# Patient Record
Sex: Female | Born: 1950
Health system: Southern US, Community
[De-identification: ages and names within clinical notes are randomized; demographics above are authoritative.]

## PROBLEM LIST (undated history)

## (undated) DIAGNOSIS — B1920 Unspecified viral hepatitis C without hepatic coma: Secondary | ICD-10-CM

## (undated) DIAGNOSIS — D72819 Decreased white blood cell count, unspecified: Secondary | ICD-10-CM

## (undated) DIAGNOSIS — E7800 Pure hypercholesterolemia, unspecified: Secondary | ICD-10-CM

## (undated) DIAGNOSIS — C50919 Malignant neoplasm of unspecified site of unspecified female breast: Secondary | ICD-10-CM

## (undated) DIAGNOSIS — I10 Essential (primary) hypertension: Secondary | ICD-10-CM

## (undated) HISTORY — PX: ABDOMINAL HYSTERECTOMY: SHX81

## (undated) HISTORY — DX: Decreased white blood cell count, unspecified: D72.819

## (undated) HISTORY — DX: Unspecified viral hepatitis C without hepatic coma: B19.20

## (undated) HISTORY — DX: Pure hypercholesterolemia, unspecified: E78.00

## (undated) HISTORY — PX: BREAST SURGERY: SHX581

## (undated) HISTORY — DX: Malignant neoplasm of unspecified site of unspecified female breast: C50.919

---

## 2001-04-29 ENCOUNTER — Encounter: Payer: Self-pay | Admitting: Emergency Medicine

## 2001-04-29 ENCOUNTER — Emergency Department (HOSPITAL_COMMUNITY): Admission: EM | Admit: 2001-04-29 | Discharge: 2001-04-29 | Payer: Self-pay | Admitting: Emergency Medicine

## 2002-03-02 DIAGNOSIS — C50919 Malignant neoplasm of unspecified site of unspecified female breast: Secondary | ICD-10-CM

## 2002-03-02 HISTORY — DX: Malignant neoplasm of unspecified site of unspecified female breast: C50.919

## 2003-03-03 HISTORY — PX: MASTECTOMY: SHX3

## 2003-04-20 ENCOUNTER — Ambulatory Visit (HOSPITAL_COMMUNITY): Admission: RE | Admit: 2003-04-20 | Discharge: 2003-04-20 | Payer: Self-pay | Admitting: General Surgery

## 2003-04-30 ENCOUNTER — Inpatient Hospital Stay (HOSPITAL_COMMUNITY): Admission: RE | Admit: 2003-04-30 | Discharge: 2003-05-02 | Payer: Self-pay | Admitting: General Surgery

## 2003-05-09 ENCOUNTER — Encounter (HOSPITAL_COMMUNITY): Admission: RE | Admit: 2003-05-09 | Discharge: 2003-06-08 | Payer: Self-pay | Admitting: Oncology

## 2003-05-09 ENCOUNTER — Encounter: Admission: RE | Admit: 2003-05-09 | Discharge: 2003-05-09 | Payer: Self-pay | Admitting: Oncology

## 2004-10-28 ENCOUNTER — Encounter: Payer: Self-pay | Admitting: Emergency Medicine

## 2004-10-29 ENCOUNTER — Inpatient Hospital Stay (HOSPITAL_COMMUNITY): Admission: AD | Admit: 2004-10-29 | Discharge: 2004-10-31 | Payer: Self-pay | Admitting: Neurosurgery

## 2006-07-05 IMAGING — CT CT HEAD W/O CM
1 of 2 series · 13 of 30 positions shown, 17 images · non-contrast
Comparison: 10/28/2004

CLINICAL DATA: Subdural hematoma

HEAD CT WITHOUT CONTRAST
TECHNIQUE: 5mm collimated images were obtained from the base of the skull
through the vertex according to standard protocol without contrast.

[Series 2: brain · axial · 0.49mm/px · z∈[+152,+279]mm · 13 of 32 slices shown, 17 images]
[im 3/32  brain]
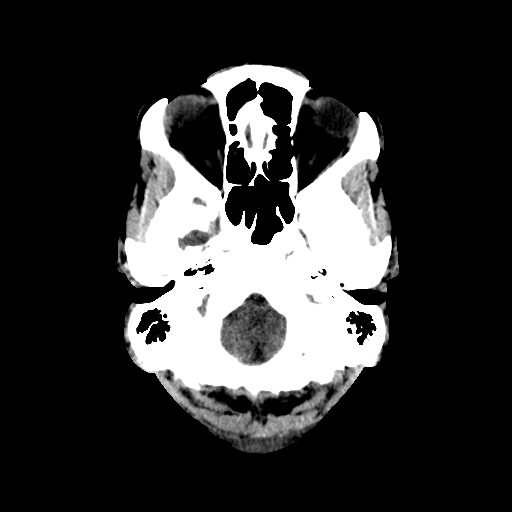
[im 3/32  bone]
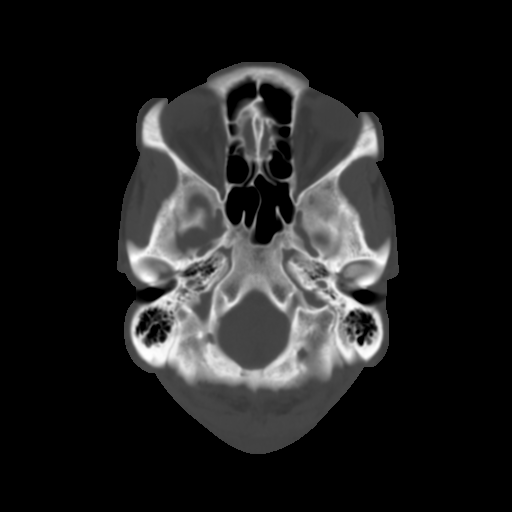
[im 5/32  brain]
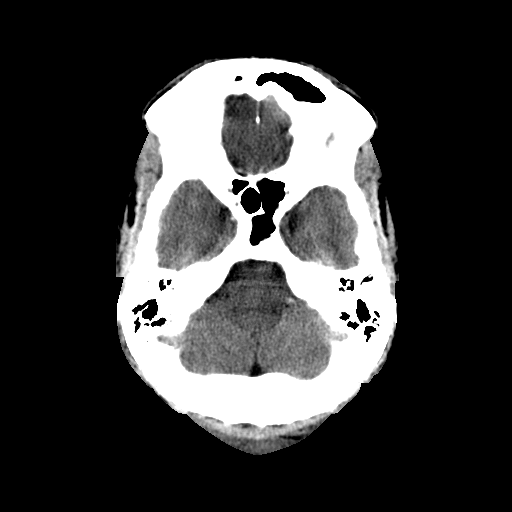
[im 7/32  brain]
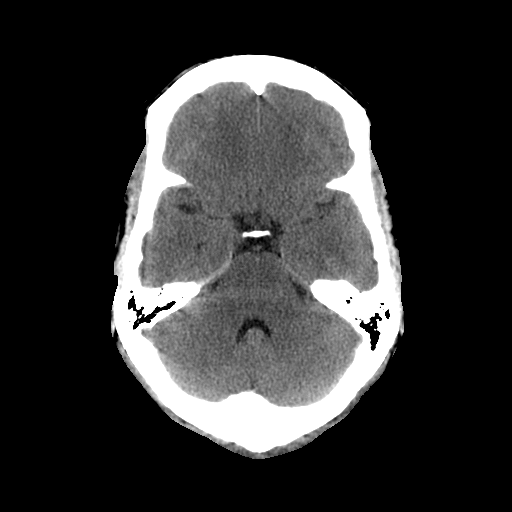
[im 9/32  brain]
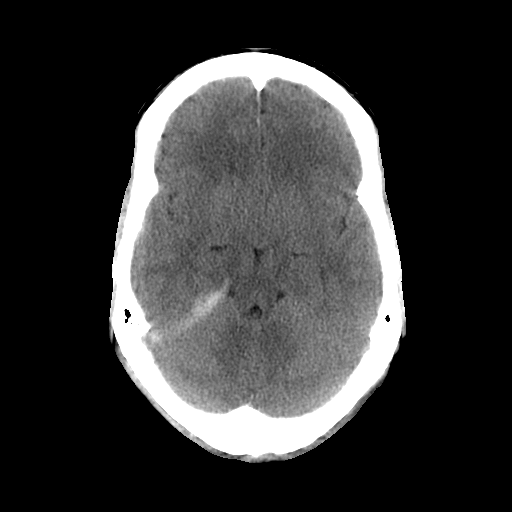
[im 12/32  brain]
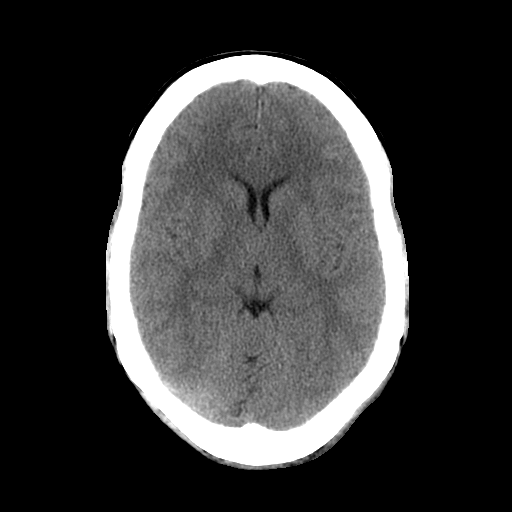
[im 12/32  bone]
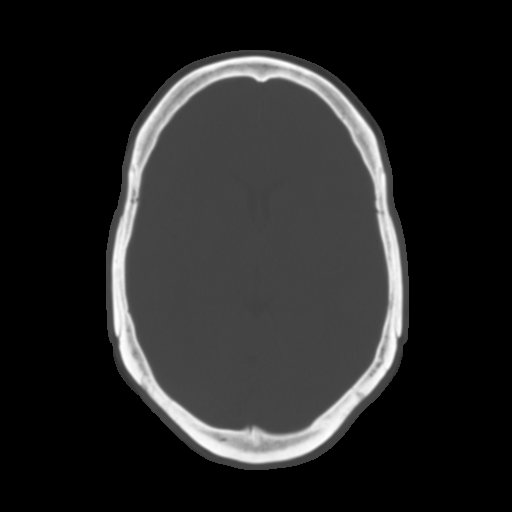
[im 14/32  brain]
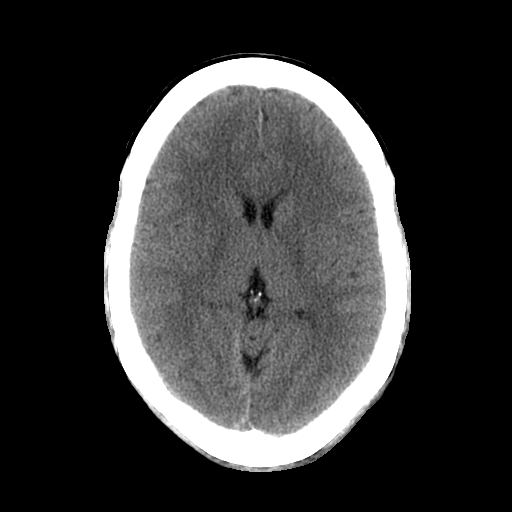
[im 16/32  brain]
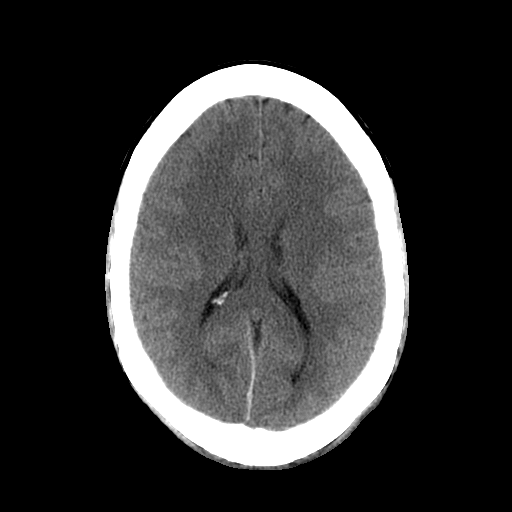
[im 18/32  brain]
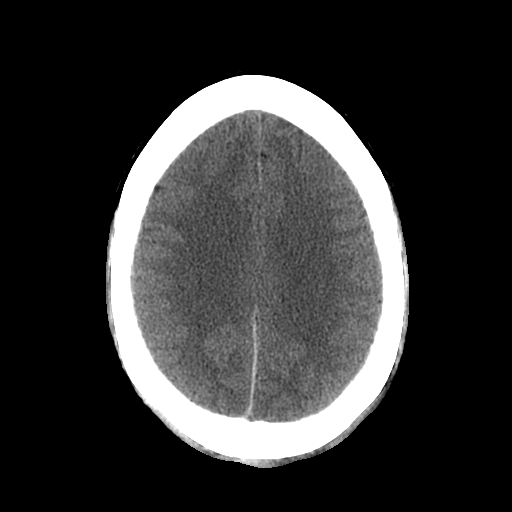
[im 20/32  brain]
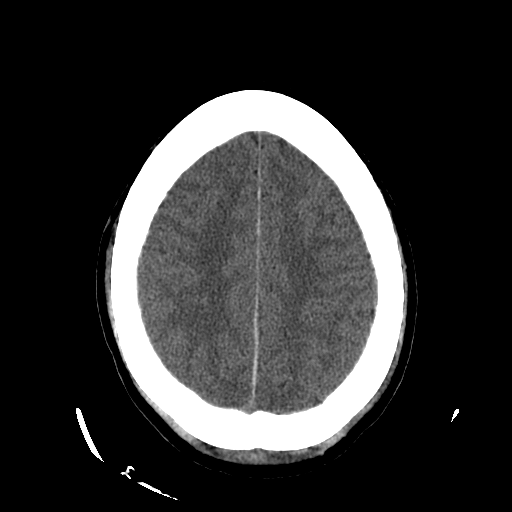
[im 20/32  bone]
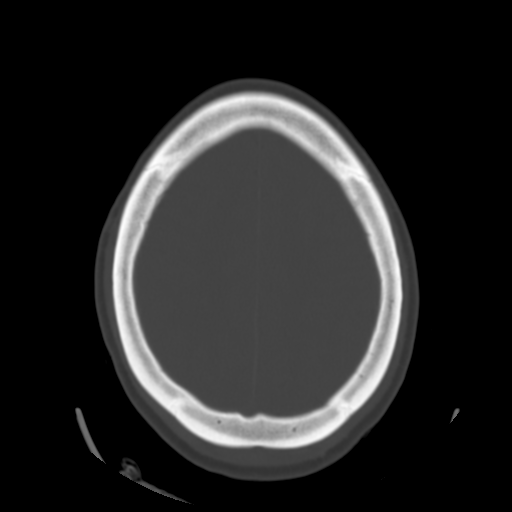
[im 23/32  brain]
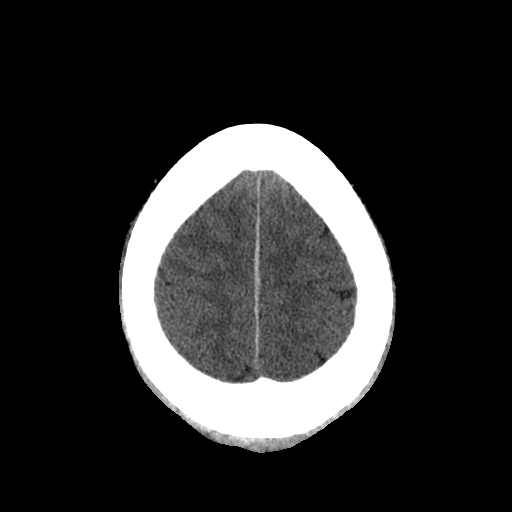
[im 25/32  brain]
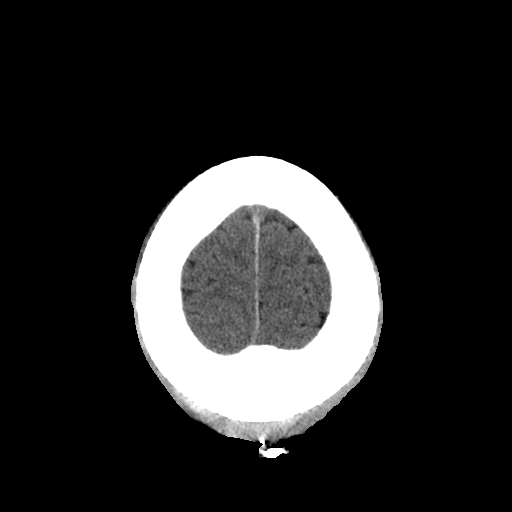
[im 27/32  brain]
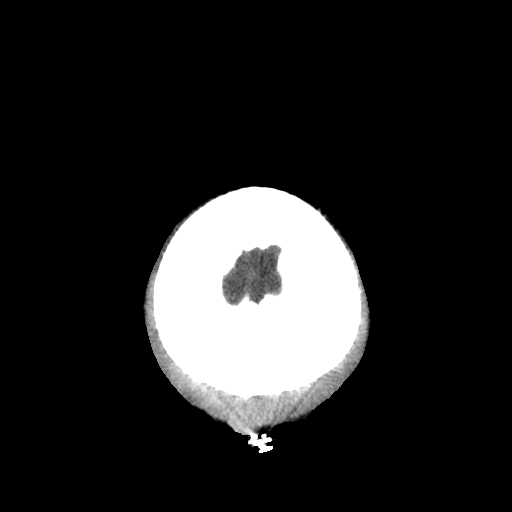
[im 29/32  brain]
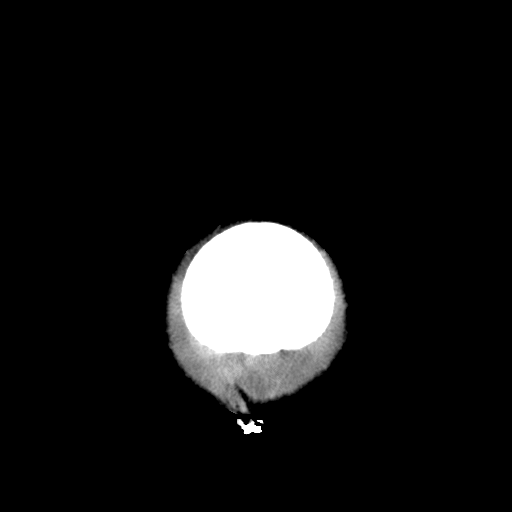
[im 29/32  bone]
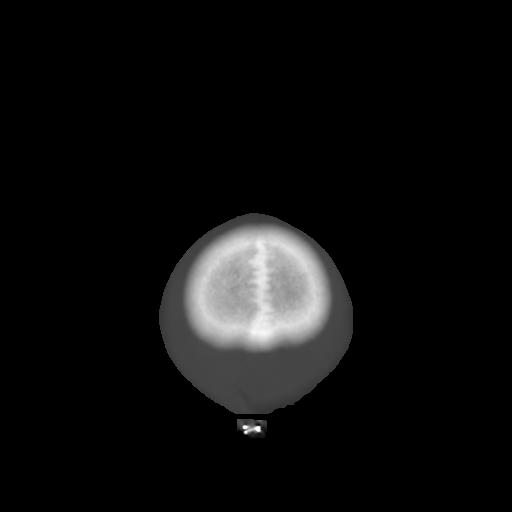

[13 of 30 positions shown; findings below may reference images not displayed]

FINDINGS: The previously described right tentorial and interhemispheric
subdural hematoma is again noted. This is not as prominent as on prior study. No
new areas of hemorrhage. No hydrocephalus.

IMPRESSION

Decreasing prominence of the right tentorial and interhemispheric subdural
hematoma.

## 2007-07-07 ENCOUNTER — Emergency Department (HOSPITAL_COMMUNITY): Admission: EM | Admit: 2007-07-07 | Discharge: 2007-07-07 | Payer: Self-pay | Admitting: Emergency Medicine

## 2008-04-25 ENCOUNTER — Emergency Department (HOSPITAL_COMMUNITY): Admission: EM | Admit: 2008-04-25 | Discharge: 2008-04-25 | Payer: Self-pay | Admitting: Emergency Medicine

## 2009-03-20 ENCOUNTER — Emergency Department (HOSPITAL_COMMUNITY): Admission: EM | Admit: 2009-03-20 | Discharge: 2009-03-20 | Payer: Self-pay | Admitting: Emergency Medicine

## 2010-03-22 ENCOUNTER — Encounter: Payer: Self-pay | Admitting: Neurosurgery

## 2010-03-23 ENCOUNTER — Encounter (HOSPITAL_COMMUNITY): Payer: Self-pay | Admitting: Oncology

## 2010-06-17 LAB — DIFFERENTIAL
Lymphocytes Relative: 34 % (ref 12–46)
Lymphs Abs: 1.3 10*3/uL (ref 0.7–4.0)
Monocytes Absolute: 0.5 10*3/uL (ref 0.1–1.0)
Monocytes Relative: 13 % — ABNORMAL HIGH (ref 3–12)
Neutro Abs: 2 10*3/uL (ref 1.7–7.7)
Neutrophils Relative %: 51 % (ref 43–77)

## 2010-06-17 LAB — BASIC METABOLIC PANEL
BUN: 19 mg/dL (ref 6–23)
CO2: 24 mEq/L (ref 19–32)
Glucose, Bld: 122 mg/dL — ABNORMAL HIGH (ref 70–99)

## 2010-06-17 LAB — CBC
Hemoglobin: 12.2 g/dL (ref 12.0–15.0)
Platelets: 211 10*3/uL (ref 150–400)
RDW: 12.9 % (ref 11.5–15.5)

## 2010-07-18 NOTE — Discharge Summary (Signed)
NAMEELIZABETHANN, [name]                           ACCOUNT NO.:  [id number]   MEDICAL RECORD NO.:  [id number]                   PATIENT TYPE:  INP   LOCATION:  A312                                 FACILITY:  [facility]   PHYSICIAN:  Dalia Heading, M.D.               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  She presented today for surgery and underwent left modified radical  mastectomy.  She tolerated the procedure well.  Final pathology is still  pending.   The patient's hematocrit on postoperative day #1 was 29.  She is ambulating  without difficulty.  Her flap drain was removed and her axillary drain  remains.  The patient is being discharged home on postoperative day #2 in  good and improving condition.   DISCHARGE INSTRUCTIONS:  The patient is to follow up with Dr. Franky Macho  on May 08, 2003.   DISCHARGE MEDICATIONS:  1. Darvocet N-100 1-2 tablets p.o. q.4h. p.r.n. pain.  2. She is to drain and record her bulb suction twice a day.   PRINCIPAL DIAGNOSES:  1. Padgett's disease.  2. Infiltrating ductal carcinoma of left breast.   PRINCIPAL PROCEDURE:  Left modified radical mastectomy on April 30, 2003.     ___________________________________________                                         Dalia Heading, M.D.   MAJ/MEDQ  D:  05/02/2003  T:  05/02/2003  Job:  [id number]   cc:   Ramon Dredge L. Juanetta Gosling, M.D.  [address]  [place]  [place] [address]  Fax: [phone]

## 2010-07-18 NOTE — Op Note (Signed)
[name], [name]                           ACCOUNT NO.:  [id number]   MEDICAL RECORD NO.:  [id number]                   PATIENT TYPE:  AMB   LOCATION:  DAY                                  FACILITY:  [facility]   PHYSICIAN:  Dalia Heading, M.D.               DATE OF BIRTH:  1950/10/25   DATE OF PROCEDURE:  04/30/2003  DATE OF DISCHARGE:                                 OPERATIVE REPORT   PREOPERATIVE DIAGNOSIS:  Left breast carcinoma.   POSTOPERATIVE DIAGNOSIS:  Left breast carcinoma.   PROCEDURE:  Left modified radical mastectomy.   SURGEON:  Dalia Heading, M.D.   ANESTHESIA:  Endotracheal.   INDICATIONS:  Patient is a 60 year old black female who was recently  diagnosed with Paget's disease of the left breast.  She had full involvement  of the left nipple with invasive ductal carcinoma.  Patient now comes to the  operating room for left modified radical mastectomy.  The risks and benefits  of the procedure, including bleeding, infection, nerve injury, were fully  explained to the patient.  Gave informed consent.   PROCEDURE NOTE:  The patient was placed in a supine position.  After  induction of general endotracheal anesthesia, the left breast and axilla  were prepped and draped using the usual sterile technique with Betadine.  Surgical site confirmation was performed.   An elliptical incision was made mediolateral around the nipple.  This  included any grossly involved nipple due to tumor.  The superior flap was  then formed up to the clavicle, the inferior flap down to the chest wall.  The breast was then removed from the pectoralis major muscle using Bovie  electrocautery from medial to lateral.  A level 2 axillary dissection was  then performed.  Care was taken to avoid the long thoracic nerve as well as  thoracodorsal artery and nerves.  The left breast and axillary contents were  then removed from the operative field.  They were sent to pathology for  further  examination.  The previous biopsy site was noted along the inferior  aspect along the nipple.  Any bleeding was controlled using Bovie  electrocautery.  Small clips were placed into the left axilla for a small  bleeding vessel.  A #10 flat Jackson-Pratt drain was placed into the left  axilla and brought out through a separate stab wound in the left mid  axillary line.  An additional #10 flat Jackson-Pratt drain was placed  underneath the flap and brought out inferiorly to the previously placed  Jackson-Pratt drain.  Both were secured at the skin level using 3-0 nylon  interrupted sutures.  Subcutaneous layers were reapproximated using a 2-0  Vicryl interrupted suture.  The skin was closed using staples.  Betadine  ointment and sterile dressings were applied.   All tape and needle counts were correct at the end of the procedure.  The  patient  was extubated in the operating room and went back to the recovery  room, awakened in stable condition.   COMPLICATIONS:  None.   SPECIMENS:  Left breast and axilla.   ESTIMATED BLOOD LOSS:  Less than 100 cc.   DRAINS:  1. Superior drain, left axilla.  2. Inferior drain, breast flap.      ___________________________________________                                            Dalia Heading, M.D.   MAJ/MEDQ  D:  04/30/2003  T:  04/30/2003  Job:  [id number]   cc:   Ramon Dredge L. Juanetta Gosling, M.D.  [address]  [place]  [place] [address]  Fax: [phone]

## 2010-07-18 NOTE — H&P (Signed)
[name], [name]                           ACCOUNT NO.:  [id number]   MEDICAL RECORD NO.:  [id number]                   PATIENT TYPE:  AMB   LOCATION:  DAY                                  FACILITY:  [facility]   PHYSICIAN:  Dalia Heading, M.D.               DATE OF BIRTH:  19-Jun-1950   DATE OF ADMISSION:  04/20/2003  DATE OF DISCHARGE:  04/20/2003                                HISTORY & PHYSICAL   CHIEF COMPLAINT:  Left breast carcinoma.   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  She now  comes to the operating room for left modified radical mastectomy.   PAST MEDICAL HISTORY:  Unremarkable.   PAST SURGICAL HISTORY:  As noted above.   CURRENT MEDICATIONS:  None.   ALLERGIES:  No known drug allergies.   REVIEW OF SYSTEMS:  Noncontributory.   PHYSICAL EXAMINATION:  GENERAL APPEARANCE:  Well-developed, well-nourished  African American female in no acute distress.  VITAL SIGNS:  Afebrile and vital signs are stable.  LUNGS:  Clear to auscultation with good breath sounds bilaterally.  HEART:  Regular rate and rhythm without S3, S4 or murmurs.  BREASTS:  Left breast examination reveals healing surgical scar at the  nipple with a deformity of the left nipple.  No axillary lymphadenopathy is  noted.  Right breast examination is unremarkable.  The axilla is negative  for palpable nodes.   IMPRESSION:  Left breast carcinoma.   PLAN:  The patient is scheduled for left modified radical mastectomy on  April 30, 2003.  The risks and benefits of the procedure including  bleeding, infection, nerve injury, possibility of a blood transfusion were  fully explained to the patient.  Gave informed consent.     ___________________________________________                                         Dalia Heading,  M.D.   MAJ/MEDQ  D:  04/25/2003  T:  04/25/2003  Job:  [id number]   cc:   Ramon Dredge L. Juanetta Gosling, M.D.  [address]  [place]  [place] [address]  Fax: [phone]

## 2010-07-18 NOTE — H&P (Signed)
NAMELOGAN, VEGH                 ACCOUNT NO.:  [id number]   MEDICAL RECORD NO.:  [id number]          PATIENT TYPE:  INP   LOCATION:  3110                         FACILITY:  [facility]   PHYSICIAN:  Gabrielle Dare. Janee Morn, M.D.DATE OF BIRTH:  May 30, 1950   DATE OF ADMISSION:  10/29/2004  DATE OF DISCHARGE:                                HISTORY & PHYSICAL   CHIEF COMPLAINT:  Head injury.   HISTORY OF PRESENT ILLNESS:  The patient is a 60 year old African-American  female who was walking down some steps at home when she fell and hit the  back of her head. She had positive loss of consciousness for approximately  30 seconds and also suffered some bruises and a laceration to the back of  her scalp. She went to [facility] emergency department where evaluation  included CT scan of the head. This showed right tentorial subdural hematoma.  She was transferred to [facility]. She was also noted to have an occipital  scalp laceration.   PAST MEDICAL HISTORY:  Breast cancer.   PAST SURGICAL HISTORY:  Hysterectomy and left mastectomy.   SOCIAL HISTORY:  She does not smoke or drink alcohol.   CURRENT MEDICATIONS:  None. Tetanus was given in [facility] ED.   ALLERGIES:  No known drug allergies.   REVIEW OF SYSTEMS:  CONSTITUTIONAL:  Negative. HEENT:  Scalp laceration.  CARDIOVASCULAR:  Negative. PULMONARY:  Negative. GI:  Negative. GU:  Negative. SKIN AND MUSCULOSKELETAL:  Abrasions to the arms, shoulders, and  toes. NEUROPSYCH:  Dizziness.   PHYSICAL EXAMINATION:  VITAL SIGNS:  Pulse of 90, blood pressure 165/70,  respirations 22, temperature 99.1, saturation is 100%.  SKIN:  Warm.  HEENT:  Has an occipital scalp laceration 8 cm in length but no active  hemorrhage. Eyes:  Extraocular muscles are intact. Pupils are 2 mm, equal  and reactive bilaterally. Ears are clear externally.  NECK:  Has no tenderness. There is no swelling. Trachea is in the midline.  LUNGS:  Clear to auscultation.  HEART:   Regular with no murmur.  ABDOMEN:  Benign. Bowel sounds are normal.  BACK:  Has some abrasions on both posterior shoulders. There is no midline  tenderness.  EXTREMITIES:  Have abrasions on the left forearm and bilateral toes.  NEUROLOGIC:  Glasgow coma scale is 15. Strength is 5/5 in upper and lower  extremities. Memory and orientation are intact. Neurovascular exam is  intact.   Laboratory studies are pending. X-ray of the cervical spine showed  degenerative changes with no fracture. CT scan of the head shows right  tentorial subdural hematoma.   IMPRESSION:  A 60 year old African-American female status post fall with:  1.  Scalp laceration.  2.  Right tentorial subdural hematoma.  3.  Scattered abrasions.   The plan will be to admit her to the neurosurgical intensive care unit. We  have obtained neurosurgery consult with Dr. Newell Coral. We will get a follow-  up CT scan of her head on August 31. We will also plan to irrigate and close  her scalp laceration.  Gabrielle Dare Janee Morn, M.D.  Electronically Signed     BET/MEDQ  D:  10/29/2004  T:  10/29/2004  Job:  [id number]

## 2010-07-18 NOTE — Op Note (Signed)
NAMEMELLONY, DANZIGER                           ACCOUNT NO.:  [id number]   MEDICAL RECORD NO.:  [id number]                   PATIENT TYPE:  AMB   LOCATION:  DAY                                  FACILITY:  [facility]   PHYSICIAN:  Dalia Heading, M.D.               DATE OF BIRTH:  05-13-1950   DATE OF PROCEDURE:  04/20/2003  DATE OF DISCHARGE:                                 OPERATIVE REPORT   PREOPERATIVE DIAGNOSIS:  Left breast mass.   POSTOPERATIVE DIAGNOSIS:  Left breast mass.   PROCEDURE:  Left breast biopsy.   SURGEON:  Dalia Heading, M.D.   ANESTHESIA:  General.   INDICATIONS:  The patient is a 60 year old black female who presenting with  a fungating mass emanating from the inferior aspect of the left nipple.  The  nipple is also severely disfigured.  There is a high suspicion for  malignancy.  The risks and benefits of the procedure including bleeding,  infection, the possibility of an malignancy being found were fully explained  to the patient, who gave informed consent.   DESCRIPTION OF PROCEDURE:  The patient was placed in the supine position.  After general anesthesia was administered, the left breast was prepped and  draped using the usual sterile technique with Betadine.  Surgical site  confirmation was performed.   An elliptical incision was made around the base of this fungating mass.  The  mass measured approximately 3-4 cm in its greatest diameter.  The mass was  excised without difficulty.  This was along the inferior aspect of the  areolar complex.  The mass was sent to pathology for further examination.  Any bleeding was controlled using Bovie electrocautery.  The skin was  reapproximated using 4-0 nylon interrupted sutures.  Bacitracin ointment and  a dry sterile dressing were applied.   All tape and needle counts were correct at the end of the procedure.  The  patient was awakened and transferred to PACU in stable condition.   COMPLICATIONS:  None.   SPECIMEN:  Left breast mass.   BLOOD LOSS:  Minimal.      ___________________________________________                                            Dalia Heading, M.D.   MAJ/MEDQ  D:  04/20/2003  T:  04/21/2003  Job:  [id number]   cc:   Dalia Heading, M.D.  [address], [place]  [place] [address]  Fax: [phone]   Oneal Deputy. Juanetta Gosling, M.D.  [address]  [place]  [place] [address]  Fax: [phone]

## 2010-07-18 NOTE — Op Note (Signed)
[name], [name]                 ACCOUNT NO.:  [id number]   MEDICAL RECORD NO.:  [id number]          PATIENT TYPE:  INP   LOCATION:  3110                         FACILITY:  [facility]   PHYSICIAN:  Gabrielle Dare. Janee Morn, M.D.DATE OF BIRTH:  Apr 07, 1950   DATE OF PROCEDURE:  10/29/2004  DATE OF DISCHARGE:                                 OPERATIVE REPORT   PREOPERATIVE DIAGNOSIS:  Scalp laceration, status post fall, with subdural  hematoma.   POSTOPERATIVE DIAGNOSIS:  Scalp laceration, status post fall, with subdural  hematoma.   PROCEDURE:  Irrigation and simple closure of occipital scalp laceration, 8  cm.   SURGEON:  Gabrielle Dare. Janee Morn, M.D.   HISTORY OF PRESENT ILLNESS:  The patient is a 60 year old African American  female who was transferred down from [facility] after suffering a  fall where she struck the back of her head and has a small right tentorial  subdural hematoma.  She was admitted to the Trauma Service to the  Neurosurgical Intensive Care Unit.  She also has an 8-cm scalp laceration  which we are closing.   PROCEDURE IN DETAIL:  The wound was copiously irrigated.  The patient had  received morphine intravenously.  Lidocaine 2% with epinephrine was  injected.  Some blood clots were washed out of her wound after it was  prepped and draped and the wound was closed in simple fashion with staples  in a single layer.  The wound was hemostatic.  The patient tolerated the  procedure well.  A sterile dressing was applied.      Gabrielle Dare Janee Morn, M.D.  Electronically Signed     BET/MEDQ  D:  10/29/2004  T:  10/29/2004  Job:  [id number]

## 2010-07-18 NOTE — H&P (Signed)
[name], [name]                           ACCOUNT NO.:  [id number]   MEDICAL RECORD NO.:  [id number]                   PATIENT TYPE:  AMB   LOCATION:  DAY                                  FACILITY:  [facility]   PHYSICIAN:  Dalia Heading, M.D.               DATE OF BIRTH:  October 13, 1950   DATE OF ADMISSION:  DATE OF DISCHARGE:                                HISTORY & PHYSICAL   CHIEF COMPLAINT:  Left breast mass.   HISTORY OF PRESENT ILLNESS:  The patient is a 60 year old black female who  was referred for evaluation and treatment of a left breast mass.  It has  been present for several months.  She thought it started as a boil and had  gotten secondarily infected.  She has not had a mammogram recently.  No  family history of breast carcinoma.  She is status post a hysterectomy in  1994.   PAST MEDICAL HISTORY:  Unremarkable.   PAST SURGICAL HISTORY:  As noted above.   CURRENT MEDICATIONS:  None.   ALLERGIES:  No known drug allergies.   REVIEW OF SYSTEMS:  Noncontributory.   PHYSICAL EXAMINATION:  GENERAL:  The patient is a well-developed, well-  nourished, African American female in no acute distress.  VITAL SIGNS:  She is afebrile.  Vital signs are stable.  NECK:  Supple without lymphadenopathy.  LUNGS:  Clear to auscultation with equal breath sounds bilaterally.  HEART:  Reveals a regular, rate and rhythm without S3, S4, or murmurs.  BREASTS:  Left breast examination reveals a large, fungating mass emanating  from the nipple.  No axillary lymphadenopathy is noted.  Right breast  examination reveals no dominant mass, nipple discharge, or dimpling.  The  axilla is negative for palpable nodes.   IMPRESSION:  Mass, left breast, worrisome for carcinoma.   PLAN:  The patient is scheduled for a left breast biopsy on April 20, 2003.  The risks and benefits of the procedure including bleeding,  infection, and the possibility of malignancy were fully explained to the  patient,  gave informed consent.  She realizes that her nipple and breast may  be deformed as a result of the procedure.     ___________________________________________                                         Dalia Heading, M.D.   MAJ/MEDQ  D:  04/17/2003  T:  04/17/2003  Job:  [id number]   cc:   Ramon Dredge L. Juanetta Gosling, M.D.  [address]  [place]  [place] [address]  Fax: [phone]

## 2011-03-17 ENCOUNTER — Encounter (HOSPITAL_COMMUNITY): Payer: Self-pay | Admitting: *Deleted

## 2011-03-17 ENCOUNTER — Emergency Department (HOSPITAL_COMMUNITY)
Admission: EM | Admit: 2011-03-17 | Discharge: 2011-03-17 | Disposition: A | Discharge status: Home / Self Care | Payer: No Typology Code available for payment source | Attending: Emergency Medicine | Admitting: Emergency Medicine

## 2011-03-17 DIAGNOSIS — R220 Localized swelling, mass and lump, head: Secondary | ICD-10-CM | POA: Insufficient documentation

## 2011-03-17 DIAGNOSIS — S8000XA Contusion of unspecified knee, initial encounter: Secondary | ICD-10-CM | POA: Insufficient documentation

## 2011-03-17 DIAGNOSIS — R51 Headache: Secondary | ICD-10-CM | POA: Insufficient documentation

## 2011-03-17 DIAGNOSIS — S0990XA Unspecified injury of head, initial encounter: Secondary | ICD-10-CM | POA: Insufficient documentation

## 2011-03-17 DIAGNOSIS — M25569 Pain in unspecified knee: Secondary | ICD-10-CM | POA: Insufficient documentation

## 2011-03-17 DIAGNOSIS — Z79899 Other long term (current) drug therapy: Secondary | ICD-10-CM | POA: Insufficient documentation

## 2011-03-17 DIAGNOSIS — R221 Localized swelling, mass and lump, neck: Secondary | ICD-10-CM | POA: Insufficient documentation

## 2011-03-17 DIAGNOSIS — IMO0002 Reserved for concepts with insufficient information to code with codable children: Secondary | ICD-10-CM | POA: Insufficient documentation

## 2011-03-17 HISTORY — DX: Essential (primary) hypertension: I10

## 2011-03-17 MED ORDER — KETOROLAC TROMETHAMINE 30 MG/ML IJ SOLN
30.0000 mg | Freq: Once | INTRAMUSCULAR | Status: AC
  Administered 2011-03-17: 30 mg via INTRAMUSCULAR
  Filled 2011-03-17: qty 1

## 2011-03-17 MED ORDER — IBUPROFEN 800 MG PO TABS: 800.0000 mg | ORAL_TABLET | Freq: Three times a day (TID) | ORAL | Status: AC

## 2011-03-17 MED ORDER — OXYCODONE-ACETAMINOPHEN 5-325 MG PO TABS
1.0000 | ORAL_TABLET | Freq: Once | ORAL | Status: AC
  Administered 2011-03-17: 1 via ORAL
  Filled 2011-03-17: qty 1

## 2011-03-17 MED ORDER — HYDROCODONE-ACETAMINOPHEN 5-325 MG PO TABS: 1.0000 | ORAL_TABLET | ORAL | Status: AC | PRN

## 2011-03-17 NOTE — ED Notes (Signed)
Pt unable to remember names of her medication states history of HTN

## 2011-03-17 NOTE — ED Provider Notes (Signed)
Medical screening examination/treatment/procedure(s) were conducted as a shared visit with non-physician practitioner(s) and myself.  I personally evaluated the patient during the encounter  Nicholes Stairs, MD 03/17/11 [phone]

## 2011-03-17 NOTE — ED Notes (Signed)
Pt c/o Head pain and bilateral knee pain.

## 2011-03-17 NOTE — ED Notes (Signed)
Pt states she does not know family medical history
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
## 2011-06-30 ENCOUNTER — Other Ambulatory Visit (HOSPITAL_COMMUNITY): Payer: Self-pay | Admitting: Physician Assistant

## 2011-06-30 DIAGNOSIS — Z853 Personal history of malignant neoplasm of breast: Secondary | ICD-10-CM

## 2011-07-03 ENCOUNTER — Ambulatory Visit (HOSPITAL_COMMUNITY)
Admission: RE | Admit: 2011-07-03 | Discharge: 2011-07-03 | Disposition: A | Discharge status: Home / Self Care | Payer: Self-pay | Source: Ambulatory Visit | Attending: Physician Assistant | Admitting: Physician Assistant

## 2011-07-03 DIAGNOSIS — Z853 Personal history of malignant neoplasm of breast: Secondary | ICD-10-CM

## 2011-07-08 ENCOUNTER — Other Ambulatory Visit: Payer: Self-pay | Admitting: Physician Assistant

## 2011-07-08 DIAGNOSIS — R928 Other abnormal and inconclusive findings on diagnostic imaging of breast: Secondary | ICD-10-CM

## 2011-07-10 ENCOUNTER — Encounter (HOSPITAL_COMMUNITY): Payer: No Typology Code available for payment source | Attending: Oncology | Admitting: Oncology

## 2011-07-10 ENCOUNTER — Encounter (HOSPITAL_COMMUNITY): Payer: Self-pay | Admitting: Oncology

## 2011-07-10 VITALS — BP 152/75 | HR 80 | Temp 98.5°F | Ht 62.0 in | Wt 154.6 lb

## 2011-07-10 DIAGNOSIS — Z853 Personal history of malignant neoplasm of breast: Secondary | ICD-10-CM | POA: Insufficient documentation

## 2011-07-10 DIAGNOSIS — I10 Essential (primary) hypertension: Secondary | ICD-10-CM | POA: Insufficient documentation

## 2011-07-10 DIAGNOSIS — D72819 Decreased white blood cell count, unspecified: Secondary | ICD-10-CM

## 2011-07-10 DIAGNOSIS — D709 Neutropenia, unspecified: Secondary | ICD-10-CM | POA: Insufficient documentation

## 2011-07-10 DIAGNOSIS — E7800 Pure hypercholesterolemia, unspecified: Secondary | ICD-10-CM | POA: Insufficient documentation

## 2011-07-10 DIAGNOSIS — J309 Allergic rhinitis, unspecified: Secondary | ICD-10-CM | POA: Insufficient documentation

## 2011-07-10 HISTORY — DX: Decreased white blood cell count, unspecified: D72.819

## 2011-07-10 LAB — CBC
HCT: 38.8 % (ref 36.0–46.0)
MCH: 28.5 pg (ref 26.0–34.0)
RBC: 4.53 MIL/uL (ref 3.87–5.11)
WBC: 4.2 10*3/uL (ref 4.0–10.5)

## 2011-07-10 LAB — DIFFERENTIAL
Basophils Absolute: 0 10*3/uL (ref 0.0–0.1)
Basophils Relative: 1 % (ref 0–1)
Eosinophils Absolute: 0.1 10*3/uL (ref 0.0–0.7)
Eosinophils Relative: 2 % (ref 0–5)
Lymphocytes Relative: 54 % — ABNORMAL HIGH (ref 12–46)
Lymphs Abs: 2.3 10*3/uL (ref 0.7–4.0)
Monocytes Absolute: 0.3 10*3/uL (ref 0.1–1.0)
Neutrophils Relative %: 35 % — ABNORMAL LOW (ref 43–77)

## 2011-07-10 NOTE — Patient Instructions (Signed)
[facility]  Discharge Instructions  RECOMMENDATIONS MADE BY THE CONSULTANT AND ANY TEST RESULTS WILL BE SENT TO YOUR REFERRING DOCTOR.   We will call you if there are any abnormal results from your lab work today.  You need to follow-up with your mammogram.  We will see you back in 6 months for lab work and then to see the doctor.   I acknowledge that I have been informed and understand all the instructions given to me and received a copy. I do not have any more questions at this time, but understand that I may call the [facility] at [facility] at [phone] during business hours should I have any further questions or need assistance in obtaining follow-up care.    __________________________________________  _____________  __________ Signature of Patient or Authorized Representative            Date                   Time    __________________________________________ Nurse's Signature

## 2011-07-10 NOTE — Progress Notes (Signed)
[name] presented for labwork. Labs per MD order drawn via Peripheral Line 23 gauge needle inserted in right antecubital.  Good blood return present. Procedure without incident.  Needle removed intact. Patient tolerated procedure well.

## 2011-07-10 NOTE — Progress Notes (Signed)
[facility] NEW PATIENT EVALUATION   Name: [name] Date: 07/10/2011 MRN: [id number] DOB: 1950/03/21    CC: [facility]     DIAGNOSIS: The encounter diagnosis was Leukopenia.   HISTORY OF PRESENT ILLNESS:[name] is a 61 y.o. African American female who is a poor historian who was referred to the [facility] for evaluation of her leukopenia and neutropenia.  The patient has a past medical histroy significant for seasonal allergies, HTN, and hypercholesterolemia.   The patient denies ever hearing in the past that her blood counts were abnormal.  She denies any medical issues.  She denies any complaints.  She is anxious about being her in the clinic.  She reports, "I did not eat before I came here because I wasn't sure if I was suppose to."  She was provided a coke and crackers today.   The patient denies having any infections, pneumonia, skin infections, antibiotics usage, and hospital admission.  She reports that her last hospital admission was for her mastectomy which occurred approximately 9 years ago.  She denies ever being diagnosied with hepatitis or HIV.  She does admit that she "thinks" she was tested before in the past and it was negative.  She does believe her husband at the time was positive.  Review of her laboratory work reveals a white blood cell count of 3.7 with a absolute neutrophil count of 0.8. This lab work was from 04/24/2011. Looking through [facility], it should be noted that there was lab work on 04/25/2008 which revealed a white blood cell count of 3.9 with a neutrophil count of 2.0. So after reviewing both of these labs, her white blood cell count neutrophil count seems to remain stable over a 2 year span.  Nevertheless, we will rule out some contributing factors with laboratory work today.  I spent some time with the patient discussing education regarding her low white blood cell count. She understands the white blood cells  were utilized to fight infections. She knows that there were 5 types of white blood cells. All 5 types are added to create a white blood cell count and number. Her white blood cells seem to be functioning well particularly in light of her lack of antibiotic usage. She has not had an increase in infections and this is promising. So, we spent some time discussing her hereditary likelihood of her leukopenia. She does understand that it is not uncommon to see an [place] American with a hereditary leukopenia.  The patient denies any headaches, dizziness, double vision, fevers, chills, night sweats, nausea, vomiting, diarrhea, constipation, chest pain, heart palpitations, shortness of breath, hemoptysis, cough, sputum production, blood in stool, black tarry stool, change in caliber of stool, stool color changes, urinary pain, urinary burning, urinary frequency, hematuria.    FAMILY HISTORY:  The patient denies a family history of any blood disorders or cancers. The patient's mother passed weight young age due to complications of the patient's of birth. She reports that her mother passed away 2 weeks after her birth. The patient's father is passed weight the age of 51 due to motor vehicle accident. She has 2 brothers who are alive ages 76 and 67. The 57 year-old brother has hypertension. She has one sister who is living who is 65 years old. She has crippling arthritis, this sounds like rheumatoid arthritis.   PAST MEDICAL HISTORY:  has a past medical history of Hypertension; Hypercholesteremia; Breast cancer (2004); and Leukopenia (07/10/2011).  PAST SURGICAL HISTORY: 1. Mastectomy approximately 9 years ago for non-latency reasons according to the patient. 2. Total hysterectomy approximately 20 years ago while she was in [place].   CURRENT MEDICATIONS:  1., 500 mg by mouth as needed 2. Norvasc 10 mg by mouth daily 3. Zyrtec 10 mg as needed 4. Lisinopril 40 mg by mouth daily 5. Pravachol 20 mg by  mouth daily.   SOCIAL HISTORY:  The patient was born in [place], [place]. She got married and moves to [place], then [place], and then back to [place]. She graduated from high school. She presently works part-time at [employer] on [employer] she is recently separated from her husband has been married to her for 39 years. She lives by her son at home. She does admit to a history of tobacco abuse for 20 years one pack per day. She therefore has a 20-pack-year smoking history. She reports that she quit 30 years ago. She denies any alcohol abuse or illicit drug abuse.     ALLERGIES: Review of patient's allergies indicates no known allergies.   LABORATORY DATA:  Review of her laboratory work reveals a white blood cell count of 3.7 with a absolute neutrophil count of 0.8. This lab work was from 04/24/2011. Looking through [facility], it should be noted that there was lab work on 04/25/2008 which revealed a white blood cell count of 3.9 with a neutrophil count of 2.0.    RADIOGRAPHY:   07/03/2011  DG SCHOLARSHIP MAMMO RIGHT  CC and MLO view(s) were taken of the right breast.  DIGITAL SCREENING MAMMOGRAM WITH CAD:  There are scattered fibroglandular densities. A possible mass is noted in the right breast. Spot  compression views and possibly sonography are recommended for further evaluation. The patient has  had a left mastectomy.  Images were processed with CAD.  IMPRESSION:  Possible mass, right breast. Additional evaluation is indicated. The patient will be contacted  for additional studies and a supplementary report will follow.  ASSESSMENT: Need additional imaging evaluation and/or prior mammograms for comparison - BI-RADS 0  Further imaging of the right breast.   REVIEW OF SYSTEMS: Patient reports no health concerns.   PHYSICAL EXAM:  height is 5\' 2"  (1.575 m) and weight is 154 lb 9.6 oz (70.126 kg). Her oral temperature is 98.5 F (36.9 C). Her blood pressure  is 152/75 and her pulse is 80.  General appearance: alert, cooperative, appears stated age and no distress Head: Normocephalic, without obvious abnormality, atraumatic Neck: no adenopathy, supple, symmetrical, trachea midline and thyroid not enlarged, symmetric, no tenderness/mass/nodules Lymph nodes: Cervical, supraclavicular, and axillary nodes normal. Resp: clear to auscultation bilaterally and normal percussion bilaterally Back: symmetric, no curvature. ROM normal. No CVA tenderness. Cardio: regular rate and rhythm, S1, S2 normal, no murmur, click, rub or gallop GI: soft, non-tender; bowel sounds normal; no masses,  no organomegaly Genitalia: Breast exam reveals a well healed post-mastectomy site of the left breast without any nodules, masses, or worrisome findings.  Exam of the right breast does not reveal any nipple discharge or distortion, no masses or lesions appreciated. Extremities: extremities normal, atraumatic, no cyanosis or edema Neurologic: Alert and oriented X 3, normal strength and tone. Normal symmetric reflexes. Normal coordination and gait     IMPRESSION:  1. Leukopenia, stable since 2010 with limited data available today. 2. Left breast mastectomy for benign reason 3. HTN 4. Hypercholesterolemia 5. Seasonal allergies   PLAN:  1. Encouraged the patient to maintain her mammogram  appointment. 2. Lab work today: CBC diff, B12, Folate, Hepatitis panel, HIV antibody, RF, ANA. 3. If above lab work is stable, CBC diff, in 6-8 months 4. Requesting lab work from previous primary care physician at [place]. 5. Patient education regarding leukopenia. 6. Return in 6-8 months following lab work.  Will release the patient from the clinic if stability is appreciated with upcoming lab work.  All questions were answered. The patient knows to call the clinic with any problems, questions, or concerns.  Patient and plan discussed with Dr. Glenford Peers and he is in agreement with  the aforementioned. Patient seen and examined by Dr. Glenford Peers as well.  Kolette Vey

## 2011-07-11 LAB — FOLATE: Folate: 11.7 ng/mL

## 2011-07-11 LAB — HIV ANTIBODY (ROUTINE TESTING W REFLEX): HIV: NONREACTIVE

## 2011-07-11 LAB — RHEUMATOID FACTOR: Rhuematoid fact SerPl-aCnc: <10 IU/mL (ref <=14)

## 2011-07-11 LAB — VITAMIN B12: Vitamin B-12: 509 pg/mL (ref 211–911)

## 2011-07-13 ENCOUNTER — Other Ambulatory Visit (HOSPITAL_COMMUNITY): Payer: Self-pay | Admitting: Oncology

## 2011-07-13 DIAGNOSIS — D72819 Decreased white blood cell count, unspecified: Secondary | ICD-10-CM

## 2011-07-13 LAB — ANA: Anti Nuclear Antibody(ANA): NEGATIVE

## 2011-07-13 LAB — HEPATITIS PANEL, ACUTE
HCV Ab: REACTIVE — AB
Hep B C IgM: NEGATIVE

## 2011-07-15 ENCOUNTER — Encounter (HOSPITAL_BASED_OUTPATIENT_CLINIC_OR_DEPARTMENT_OTHER): Payer: No Typology Code available for payment source

## 2011-07-15 DIAGNOSIS — D72819 Decreased white blood cell count, unspecified: Secondary | ICD-10-CM

## 2011-07-15 NOTE — Progress Notes (Signed)
Labs drawn today for Hep C vrs RNA by PC[facility]

## 2011-07-16 LAB — HEPATITIS C VRS RNA DETECT BY PCR-QUAL: Hepatitis C Vrs RNA by PCR-Qual: POSITIVE — AB

## 2011-07-17 ENCOUNTER — Encounter (HOSPITAL_COMMUNITY): Payer: Self-pay | Admitting: Oncology

## 2011-07-17 ENCOUNTER — Other Ambulatory Visit (HOSPITAL_COMMUNITY): Payer: Self-pay | Admitting: Oncology

## 2011-07-17 DIAGNOSIS — D72819 Decreased white blood cell count, unspecified: Secondary | ICD-10-CM

## 2011-07-17 DIAGNOSIS — B1920 Unspecified viral hepatitis C without hepatic coma: Secondary | ICD-10-CM

## 2011-07-17 HISTORY — DX: Unspecified viral hepatitis C without hepatic coma: B19.20

## 2011-07-31 ENCOUNTER — Ambulatory Visit: Payer: No Typology Code available for payment source | Admitting: Urgent Care

## 2011-08-04 ENCOUNTER — Ambulatory Visit: Payer: No Typology Code available for payment source | Admitting: Gastroenterology

## 2011-08-12 LAB — CBC WITH DIFFERENTIAL/PLATELET
HCT: 38 %
Hemoglobin: 12.9 g/dL (ref 12.0–16.0)
WBC: 3.5

## 2011-08-12 LAB — COMPREHENSIVE METABOLIC PANEL
ALT: 32 U/L (ref 7–35)
AST: 33 U/L
Alkaline Phosphatase: 79 U/L
Total Bilirubin: 0.7 mg/dL
Total Protein: 7.4 g/dL

## 2011-08-19 ENCOUNTER — Encounter: Payer: Self-pay | Admitting: Gastroenterology

## 2011-08-19 ENCOUNTER — Ambulatory Visit (INDEPENDENT_AMBULATORY_CARE_PROVIDER_SITE_OTHER): Payer: No Typology Code available for payment source | Admitting: Gastroenterology

## 2011-08-19 VITALS — BP 149/83 | HR 94 | Temp 97.8°F | Ht 63.0 in | Wt 157.8 lb

## 2011-08-19 DIAGNOSIS — B1920 Unspecified viral hepatitis C without hepatic coma: Secondary | ICD-10-CM

## 2011-08-19 NOTE — Patient Instructions (Addendum)
Hepatitis C   Hepatitis C is a viral infection of the liver. Infection may go undetected for months or years because symptoms may be absent or very mild. Chronic liver disease is the main danger of hepatitis C. This may lead to scarring of the liver (cirrhosis), liver failure, and liver cancer.   CAUSES   Hepatitis C is caused by the hepatitis C virus (HCV). Formerly, hepatitis C infections were most commonly transmitted through blood transfusions. In the early 1990s, routine testing of donated blood for hepatitis C and exclusion of blood that tests positive for HCV began. Now, HCV is most commonly transmitted from person to person through injection drug use, sharing needles, or sex with an infected person. A caregiver may also get the infection from exposure to the blood of an infected patient by way of a cut or needle stick.   SYMPTOMS   Acute Phase   Many cases of acute HCV infection are mild and cause few problems. Some people may not even realize they are sick. Symptoms in others may last a few weeks to several months and include:   Feeling very tired.   Loss of appetite.   Nausea.   Vomiting.   Abdominal pain.   Dark yellow urine.   Yellow skin and eyes (jaundice).   Itching of the skin.  Chronic Phase   Between 50% to 85% of people who get HCV infection become "chronic carriers." They often have no symptoms, but the virus stays in their body. They may spread the virus to others and can get long-term liver disease.   Many people with chronic HCV infection remain healthy for many years. However, up to 1 in 5 chronically infected people may develop severe liver diseases including scarring of the liver (cirrhosis), liver failure, or liver cancer.  DIAGNOSIS   Diagnosis of hepatitis C infection is made by testing blood for the presence of hepatitis C viral particles called RNA. Other tests may also be done to measure the status of current liver function, exclude other liver problems, or assess liver damage.    TREATMENT   Treatment with many antiviral drugs is available and recommended for some patients with chronic HCV infection. Drug treatment is generally considered appropriate for patients who:   Are 18 years of age or older.   Have a positive test for HCV particles in the blood.   Have a liver tissue sample (biopsy) that shows chronic hepatitis and significant scarring (fibrosis).   Do not have signs of liver failure.   Have acceptable blood test results that confirm the wellness of other body organs.   Are willing to be treated and conform to treatment requirements.   Have no other circumstances that would prevent treatment from being recommended (contraindications).  All people who are offered and choose to receive drug treatment must understand that careful medical follow up for many months and even years is crucial in order to make successful care possible. The goal of drug treatment is to eliminate any evidence of HCV in the blood on a long-term basis. This is called a "sustained virologic response" or SVR. Achieving a SVR is associated with a decrease in the chance of life-threatening liver problems, need for a liver transplant, liver cancer rates, and liver-related complications.   Successful treatment currently requires taking treatment drugs for at least 24 weeks and up to 72 weeks. An injected drug (interferon) given weekly and an oral antiviral medicine taken daily are usually prescribed. Side effects from these   drugs are common and some may be very serious. Your response to treatment must be carefully monitored by both you and your caregiver throughout the entire treatment period.   PREVENTION   There is no vaccine for hepatitis C. The only way to prevent the disease is to reduce the risk of exposure to the virus.   Avoid sharing drug needles or personal items like toothbrushes, razors, and nail clippers with an infected person.   Healthcare workers need to avoid injuries and wear appropriate protective  equipment such as gloves, gowns, and face masks when performing invasive medical or nursing procedures.  HOME CARE INSTRUCTIONS   To avoid making your liver disease worse:   Strictly avoid drinking alcohol.   Carefully review all new prescriptions of medicines with your caregiver. Ask your caregiver which drugs you should avoid. The following drugs are toxic to the liver, and your caregiver may tell you to avoid them:   Isoniazid.   Methyldopa.   Acetaminophen.   Anabolic steroids (muscle-building drugs).   Erythromycin.   Oral contraceptives (birth control pills).   Check with your caregiver to make sure medicine you are currently taking will not be harmful.   Periodic blood tests may be required. Follow your caregiver's advice about when you should have blood tests.   Avoid a sexual relationship until advised otherwise by your caregiver.   Avoid activities that could expose other people to your blood. Examples include sharing a toothbrush, nail clippers, razors, and needles.   Bed rest is not necessary, but it may make you feel better. Recovery time is not related to the amount of rest you receive.   This infection is contagious. Follow your caregiver's instructions in order to avoid spread of the infection.  SEEK IMMEDIATE MEDICAL CARE IF:   You have increasing fatigue or weakness.   You have an oral temperature above 102 F (38.9 C), not controlled by medicine.   You develop loss of appetite, nausea, or vomiting.   You develop jaundice.   You develop easy bruising or bleeding.   You develop any severe problems as a result of your treatment.  MAKE SURE YOU:   Understand these instructions.   Will watch your condition.   Will get help right away if you are not doing well or get worse.  Document Released: 02/14/2000 Document Revised: 02/05/2011 Document Reviewed: 06/18/2010   ExitCare Patient Information 2012 [employer].

## 2011-08-19 NOTE — Progress Notes (Signed)
Labs from 08/12/2011 white blood cell count 3500, hemoglobin 12.9, hematocrit 38.2, MCV 82.2, platelets 227,000, BUN 13, creatinine 0.94, total bilirubin 0.7, alkaline phosphatase 79, AST 33, ALT 32, albumin 4.7

## 2011-08-19 NOTE — Assessment & Plan Note (Signed)
Patient recently with positive HCV RNA/HCV Ab. Her likely source would be her estranged husband based on lack of other risk factors. Discussed at length, natural history, modes of infection, potential treatments of hepatitis C. She will need to have immune status to Hep A and B check and vaccinated if needed. She will need LFTs, AFP tumor marker, HCV quantitative and genotyping. Abd u/s will be ordered. Patient is working on patient assistance forms with [employer]. She has asked to postpone abd u/s until she has been approved. She was given lab orders today. We will request copy of [facility]   At later date, she will need colonoscopy for screening purposes. If any signs of cirrhosis on U/S, she will need EGD to look for esophageal varices. Further recommendations to follow.

## 2011-08-19 NOTE — Progress Notes (Signed)
Primary Care Physician:  No primary provider on file. Referring provider: Dr. Glenford Peers Primary Gastroenterologist:  Jonette Eva, MD   Chief Complaint  Patient presents with  . Hepatitis C    HPI:  [name] is a 61 y.o. female here at request of Dr. Thornton Papas office for further evaluation of recent positive HCV RNA.   Blood transfusion in late 1990s at time of hysterectomy. Blood donation prior to children, in the late 1970s. No prior drug use, intranasal or IV. States she is separated from her husband of 39 years. Separated for nearly one year. He thought he was "God's gift to women." She believes he is her risk factor. Denies any jaundice in past. Never been told her LFTs were abnormal. Seen at [facility] few years ago. Last routine labs done at [facility] aside from what was done at Dr. Thornton Papas. Remote tcs more than 15 years ago. Pravachol for few months.   She denies abd pain, itching, constipation, diarrhea, melena, brbpr, gerd, dysphagia, weight loss, anorexia, fatigue.   Current Outpatient Prescriptions  Medication Sig Dispense Refill  . acetaminophen (TYLENOL) 500 MG tablet Take 500 mg by mouth every 6 (six) hours as needed.      Marland Kitchen amLODipine (NORVASC) 10 MG tablet Take 10 mg by mouth daily.      . cetirizine (ZYRTEC) 10 MG tablet Take 10 mg by mouth as needed.      Marland Kitchen lisinopril (PRINIVIL,ZESTRIL) 40 MG tablet Take 40 mg by mouth daily.      . pravastatin (PRAVACHOL) 20 MG tablet Take 20 mg by mouth daily.        Allergies as of 08/19/2011  . (No Known Allergies)    Past Medical History  Diagnosis Date  . Hypertension   . Hypercholesteremia   . 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 Leukopenia 07/10/2011    Stable compared to 2012  . Hepatitis C 07/17/2011    Positive Hepatitis C Antibody test and then confirmed with Hepatitis C Virus RNA by PCR.    Past Surgical  History  Procedure Date  . Mastectomy 2005    left side  . Abdominal hysterectomy     late 1990s.    Family History  Problem Relation Age of Onset  . Arthritis Sister     RA    History   Social History  . Marital Status: Married    Spouse Name: N/A    Number of Children: N/A  . Years of Education: N/A   Occupational History  . Not on file.   Social History Main Topics  . Smoking status: Former Games developer  . Smokeless tobacco: Never Used  . Alcohol Use: No  . Drug Use: No  . Sexually Active:    Other Topics Concern  . Not on file   Social History Narrative   Recently separated from husband of 39 years.       ROS:  General: Negative for anorexia, weight loss, fever, chills, fatigue, weakness. Eyes: Negative for vision changes.  ENT: Negative for hoarseness, difficulty swallowing , nasal congestion. CV: Negative for chest pain, angina, palpitations, dyspnea on exertion, peripheral edema.  Respiratory: Negative for dyspnea at rest, dyspnea on exertion, cough, sputum, wheezing.  GI: See history of present illness. GU:  Negative for dysuria, hematuria, urinary incontinence, urinary frequency, nocturnal urination.  MS: Negative for joint pain, low back pain.  Derm: Negative  for rash or itching.  Neuro: Negative for weakness, abnormal sensation, seizure, frequent headaches, memory loss, confusion.  Psych: Negative for anxiety, depression, suicidal ideation, hallucinations.  Endo: Negative for unusual weight change.  Heme: Negative for bruising or bleeding. Allergy: Negative for rash or hives.    Physical Examination:  BP 149/83  Pulse 94  Temp 97.8 F (36.6 C) (Temporal)  Ht 5\' 3"  (1.6 m)  Wt 157 lb 12.8 oz (71.578 kg)  BMI 27.95 kg/m2   General: Well-nourished, well-developed in no acute distress.  Head: Normocephalic, atraumatic.   Eyes: Conjunctiva pink, no icterus. Mouth: Oropharyngeal mucosa moist and pink , no lesions erythema or exudate. Neck: Supple  without thyromegaly, masses, or lymphadenopathy.  Lungs: Clear to auscultation bilaterally.  Heart: Regular rate and rhythm, no murmurs rubs or gallops.  Abdomen: Bowel sounds are normal, nontender, nondistended, no hepatosplenomegaly or masses, no abdominal bruits or    hernia , no rebound or guarding.   Rectal: not performed Extremities: No lower extremity edema. No clubbing or deformities.  Neuro: Alert and oriented x 4 , grossly normal neurologically.  Skin: Warm and dry, no rash or jaundice.   Psych: Alert and cooperative, normal mood and affect.  Labs: Lab Results  Component Value Date   WBC 4.2 07/10/2011   HGB 12.9 07/10/2011   HCT 38.8 07/10/2011   MCV 85.7 07/10/2011   PLT 226 07/10/2011    Lab Results  Component Value Date   VITAMINB12 509 07/10/2011   Lab Results  Component Value Date   FOLATE 11.7 07/10/2011    Lab Results  Component Value Date   ANA NEGATIVE 07/10/2011   Lab Results  Component Value Date   HEPAIGM NEGATIVE 07/10/2011   HEPBIGM NEGATIVE 07/10/2011   HCV Ab reactive Hep B surface Ag negative HIV nonreactive  Imaging Studies: No results found.

## 2011-08-20 NOTE — Progress Notes (Signed)
No PCP on file faxed to Dr. Mariel Sleet

## 2011-10-01 NOTE — Progress Notes (Signed)
CALL PT TO SEE IF SHE IS ABLE TO COMPLETE HER LABS/U/S.  REVIEWED. AGREE.

## 2011-10-01 NOTE — Progress Notes (Signed)
REVIEWED.  

## 2011-12-14 ENCOUNTER — Encounter: Payer: Self-pay | Admitting: Medical

## 2012-01-07 ENCOUNTER — Other Ambulatory Visit (HOSPITAL_COMMUNITY): Payer: No Typology Code available for payment source

## 2012-01-11 ENCOUNTER — Encounter (HOSPITAL_COMMUNITY): Payer: Self-pay | Admitting: Oncology

## 2012-01-11 ENCOUNTER — Ambulatory Visit (HOSPITAL_COMMUNITY): Payer: No Typology Code available for payment source | Admitting: Oncology

## 2012-02-16 ENCOUNTER — Telehealth: Payer: Self-pay | Admitting: Gastroenterology

## 2012-02-16 NOTE — Telephone Encounter (Signed)
Lab cancelled bunch of orders that patient was supposed to have done in 08/2011. She also did not have her abd u/s done.  She should of had HCV genotype, Hep A total Ab, Hep B surf Ab, LFTs done.  Please see if patient wants to reschedule these and if so, schedule OV to follow.

## 2012-02-16 NOTE — Telephone Encounter (Signed)
LMOM to call.

## 2012-02-23 NOTE — Telephone Encounter (Signed)
Mailed letter for pt to call.

## 2012-12-22 ENCOUNTER — Emergency Department (INDEPENDENT_AMBULATORY_CARE_PROVIDER_SITE_OTHER)
Admission: EM | Admit: 2012-12-22 | Discharge: 2012-12-22 | Disposition: A | Discharge status: Home / Self Care | Payer: Self-pay | Source: Home / Self Care

## 2012-12-22 ENCOUNTER — Encounter (HOSPITAL_COMMUNITY): Payer: Self-pay | Admitting: Emergency Medicine

## 2012-12-22 DIAGNOSIS — I10 Essential (primary) hypertension: Secondary | ICD-10-CM

## 2012-12-22 MED ORDER — LISINOPRIL 40 MG PO TABS: ORAL_TABLET | ORAL | Status: DC

## 2012-12-22 MED ORDER — LISINOPRIL 10 MG PO TABS: ORAL_TABLET | ORAL | Status: DC

## 2012-12-22 NOTE — ED Provider Notes (Signed)
Medical screening examination/treatment/procedure(s) were performed by a resident physician or non-physician practitioner and as the supervising physician I was immediately available for consultation/collaboration.  Jinna Weinman, MD    Blain Hunsucker S Jodeci Rini, MD 12/22/12 [id number] 

## 2012-12-22 NOTE — ED Notes (Signed)
Patient reports headache and dizziness onset yesterday.  Patient also started seeing therapist yesterday and started on 3 meds to help patient relax, help anxiety and help sleep.  Also reports therapist's office noted elevated blood pressure.  Patient no longer has a pcp (last one in [place]) patient has not taken blood pressure medicine in 6 months.

## 2012-12-22 NOTE — ED Provider Notes (Signed)
CSN: [id number]     Arrival date & time 12/22/12  1048 History   None    Chief Complaint  Patient presents with  . Hypertension   (Consider location/radiation/quality/duration/timing/severity/associated sxs/prior Treatment) HPI Comments: 62 year old female states that she is having headache and lightheadedness since she has been out of her antihypertensive medication. Her physician was in reasonable and she can no longer afford him. She says she has not been able to find a doctor locally since she moved to [place] 3 years ago. She has been out of medication for one year. She denies chest pain, shortness of breath, swelling or orthopnea.   Past Medical History  Diagnosis Date  . Hypertension   . Hypercholesteremia   . 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 Leukopenia 07/10/2011    Stable compared to 2012  . Hepatitis C 07/17/2011    Positive Hepatitis C Antibody test and then confirmed with Hepatitis C Virus RNA by PCR.   Past Surgical History  Procedure Laterality Date  . Mastectomy  2005    left side  . Abdominal hysterectomy      late 1990s.   Family History  Problem Relation Age of Onset  . Arthritis Sister     RA   History  Substance Use Topics  . Smoking status: Former Games developer  . Smokeless tobacco: Never Used  . Alcohol Use: No   OB History   Grav Para Term Preterm Abortions TAB SAB Ect Mult Living                 Review of Systems  Constitutional: Negative for fever, diaphoresis, activity change and fatigue.  Respiratory: Negative.   Cardiovascular: Negative.  Negative for chest pain, palpitations and leg swelling.  Gastrointestinal: Negative.   Skin: Negative for rash.  Neurological: Positive for light-headedness and headaches. Negative for tremors, seizures, syncope, speech difficulty and weakness.    Allergies  Review of patient's allergies indicates no known allergies.  Home  Medications   Current Outpatient Rx  Name  Route  Sig  Dispense  Refill  . acetaminophen (TYLENOL) 500 MG tablet   Oral   Take 500 mg by mouth every 6 (six) hours as needed.         Marland Kitchen amLODipine (NORVASC) 10 MG tablet   Oral   Take 10 mg by mouth daily.         . cetirizine (ZYRTEC) 10 MG tablet   Oral   Take 10 mg by mouth as needed.         Marland Kitchen lisinopril (PRINIVIL,ZESTRIL) 40 MG tablet   Oral   Take 40 mg by mouth daily.         Marland Kitchen lisinopril (PRINIVIL,ZESTRIL) 40 MG tablet      Take 1 tablet daily for BP   30 tablet   0   . pravastatin (PRAVACHOL) 20 MG tablet   Oral   Take 20 mg by mouth daily.          BP 160/63  Pulse 83  Temp(Src) 98.3 F (36.8 C) (Oral)  Resp 12  SpO2 100% Physical Exam  Nursing note and vitals reviewed. Constitutional: She is oriented to person, place, and time. She appears well-developed and well-nourished. No distress.  HENT:  Mouth/Throat: Oropharynx is clear and moist. No oropharyngeal exudate.  Eyes: Conjunctivae and EOM are normal.  Neck: Normal range of motion. Neck supple.  Cardiovascular: Normal rate,  regular rhythm and normal heart sounds.   Pulmonary/Chest: Effort normal and breath sounds normal. No respiratory distress. She has no wheezes. She has no rales.  Musculoskeletal: She exhibits no edema.  Lymphadenopathy:    She has no cervical adenopathy.  Neurological: She is alert and oriented to person, place, and time. No cranial nerve deficit. She exhibits normal muscle tone.  Skin: Skin is warm and dry. No rash noted.  Psychiatric: She has a normal mood and affect.    ED Course  Procedures (including critical care time) Labs Review Labs Reviewed - No data to display Imaging Review No results found.    MDM   1. HTN (hypertension)      Lisinopril 40 mg by mouth daily. Recommended followup with ED wellness Center as soon as possible. The patient is advised that there is an inherent risks of prescribing  medication providers who are without sufficient evidence including lab work, past medical history and adverse reaction to medications. We also did not have the advantage of regular followups.  Hayden Rasmussen, NP 12/22/12 1134

## 2014-03-14 ENCOUNTER — Ambulatory Visit: Payer: Self-pay

## 2014-03-15 ENCOUNTER — Ambulatory Visit: Payer: Self-pay | Attending: Internal Medicine

## 2014-03-20 ENCOUNTER — Encounter: Payer: Self-pay | Admitting: Family Medicine

## 2014-03-20 ENCOUNTER — Ambulatory Visit: Payer: Self-pay | Attending: Family Medicine | Admitting: Family Medicine

## 2014-03-20 VITALS — BP 175/81 | HR 82 | Temp 98.4°F | Resp 16 | Ht 63.0 in | Wt 174.6 lb

## 2014-03-20 DIAGNOSIS — F329 Major depressive disorder, single episode, unspecified: Secondary | ICD-10-CM | POA: Insufficient documentation

## 2014-03-20 DIAGNOSIS — F419 Anxiety disorder, unspecified: Secondary | ICD-10-CM | POA: Insufficient documentation

## 2014-03-20 DIAGNOSIS — Z90710 Acquired absence of both cervix and uterus: Secondary | ICD-10-CM | POA: Insufficient documentation

## 2014-03-20 DIAGNOSIS — B182 Chronic viral hepatitis C: Secondary | ICD-10-CM

## 2014-03-20 DIAGNOSIS — R45851 Suicidal ideations: Secondary | ICD-10-CM | POA: Insufficient documentation

## 2014-03-20 DIAGNOSIS — I10 Essential (primary) hypertension: Secondary | ICD-10-CM | POA: Insufficient documentation

## 2014-03-20 DIAGNOSIS — F418 Other specified anxiety disorders: Secondary | ICD-10-CM

## 2014-03-20 DIAGNOSIS — F32A Depression, unspecified: Secondary | ICD-10-CM | POA: Insufficient documentation

## 2014-03-20 LAB — COMPLETE METABOLIC PANEL WITH GFR
ALT: 31 U/L (ref 0–35)
AST: 35 U/L (ref 0–37)
Albumin: 4.3 g/dL (ref 3.5–5.2)
Alkaline Phosphatase: 78 U/L (ref 39–117)
BUN: 13 mg/dL (ref 6–23)
CALCIUM: 9.6 mg/dL (ref 8.4–10.5)
CHLORIDE: 100 meq/L (ref 96–112)
CO2: 26 meq/L (ref 19–32)
Creat: 0.88 mg/dL (ref 0.50–1.10)
GFR, EST NON AFRICAN AMERICAN: 71 mL/min
GFR, Est African American: 81 mL/min
GLUCOSE: 100 mg/dL — AB (ref 70–99)
Potassium: 4.5 mEq/L (ref 3.5–5.3)
SODIUM: 135 meq/L (ref 135–145)
TOTAL PROTEIN: 7.3 g/dL (ref 6.0–8.3)
Total Bilirubin: 0.7 mg/dL (ref 0.2–1.2)

## 2014-03-20 LAB — LIPID PANEL
CHOL/HDL RATIO: 3.5 ratio
CHOLESTEROL: 212 mg/dL — AB (ref 0–200)
HDL: 60 mg/dL (ref >39)
LDL Cholesterol: 132 mg/dL — ABNORMAL HIGH (ref 0–99)
Triglycerides: 99 mg/dL (ref <150)
VLDL: 20 mg/dL (ref 0–40)

## 2014-03-20 LAB — CBC
HEMATOCRIT: 37.9 % (ref 36.0–46.0)
HEMOGLOBIN: 13 g/dL (ref 12.0–15.0)
MCH: 28.8 pg (ref 26.0–34.0)
MCHC: 34.3 g/dL (ref 30.0–36.0)
MCV: 83.8 fL (ref 78.0–100.0)
MPV: 11 fL (ref 8.6–12.4)
Platelets: 207 10*3/uL (ref 150–400)
RBC: 4.52 MIL/uL (ref 3.87–5.11)
RDW: 14 % (ref 11.5–15.5)
WBC: 4.5 10*3/uL (ref 4.0–10.5)

## 2014-03-20 LAB — TSH: TSH: 0.539 u[IU]/mL (ref 0.350–4.500)

## 2014-03-20 MED ORDER — TRAZODONE HCL 50 MG PO TABS: 50.0000 mg | ORAL_TABLET | Freq: Every day | ORAL | Status: DC

## 2014-03-20 MED ORDER — ESCITALOPRAM OXALATE 10 MG PO TABS: 10.0000 mg | ORAL_TABLET | Freq: Every day | ORAL | Status: DC

## 2014-03-20 MED ORDER — LISINOPRIL 40 MG PO TABS: 40.0000 mg | ORAL_TABLET | Freq: Every day | ORAL | Status: DC

## 2014-03-20 MED ORDER — AMLODIPINE BESYLATE 10 MG PO TABS: 10.0000 mg | ORAL_TABLET | Freq: Every day | ORAL | Status: DC

## 2014-03-20 NOTE — Patient Instructions (Signed)
Ms, [name],   Thank you for coming in today. It was a pleasure meeting you. I look forward to being your primary doctor.   1. HTN: goal BP < 140/90 at all times. Low salt diet Restart norvasc 10 mg daily, lisinopril 20 mg daily   2. Depressed and anxious moods: lexapro 10 mg daily  Trazodone 25-50 mg at night  Counseling and [facility] services at [facility]. If you feel like you will hurt your self please go immediately to [facility], they have emergency psychiatric services there.   You will be called with lab results.  F/u in 2 weeks with nurse for BP check  F/u with me in 4 weeks for HTN and depressed/anxious mood   Dr. Adrian Blackwater

## 2014-03-20 NOTE — Assessment & Plan Note (Signed)
A: uncontrolled HTN: goal BP < 140/90 at all times. P: Low salt diet Restart norvasc 10 mg daily, lisinopril 20 mg daily

## 2014-03-20 NOTE — Progress Notes (Signed)
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

## 2014-03-20 NOTE — Assessment & Plan Note (Signed)
  2. Depressed and anxious moods: lexapro 10 mg daily  Trazodone 25-50 mg at night  Counseling and social services at [facility]. If you feel like you will hurt your self please go immediately to [facility], they have emergency psychiatric services there.

## 2014-03-20 NOTE — Progress Notes (Signed)
   Subjective:    Patient ID: [name], female    DOB: Jul 04, 1950, 64 y.o.   MRN: [id number] CC: HTN, depression HPI 64 yo F new patient:  1. HTN: dx in 1996. Previously well controlled on Norvasc and lisinopril. Has been unable to afford medication. Gets some HA. No vision changes, CP, SOB, LE edema.   2. Depression: x 4 months. Since she lost her job. Previously on Zoloft and trazodone. Admits to poor sleep. Admits to SI. Denies plan for suicide. Lives alone. Has close friends and a supportive cousin. Children live in [place]. Brothers live in [place].   Soc Hx: non smoker Med Hx: HTN dx in 1996 Surg Hx: s/p hysterectomy   Review of Systems GAD 7: score of 21. 3 all down the line.     Objective:   Physical Exam BP 175/81 mmHg  Pulse 82  Temp(Src) 98.4 F (36.9 C)  Resp 16  Ht 5\' 3"  (1.6 m)  Wt 174 lb 9.6 oz (79.198 kg)  BMI 30.94 kg/m2  SpO2 100% General appearance: alert, cooperative and no distress Neck: no adenopathy, supple, symmetrical, trachea midline and thyroid not enlarged, symmetric, no tenderness/mass/nodules Lungs: clear to auscultation bilaterally Heart: regular rate and rhythm, S1, S2 normal, no murmur, click, rub or gallop Extremities: extremities normal, atraumatic, no cyanosis or edema   Treated with clonidine in the office     Assessment & Plan:

## 2014-03-22 NOTE — Addendum Note (Signed)
Addended by: Boykin Nearing on: 03/22/2014 01:11 PM   Modules accepted: Orders

## 2014-03-22 NOTE — Assessment & Plan Note (Signed)
Ordered Hep C virus RNA quant

## 2014-03-27 ENCOUNTER — Telehealth: Payer: Self-pay | Admitting: *Deleted

## 2014-03-27 NOTE — Telephone Encounter (Signed)
-----   Message from Minerva Ends, MD sent at 03/22/2014  1:09 PM EST ----- Normal CMP, CBC. Slightly elevated cholesterol, will need to keep control and BP < 140/90 and focus on healthy diet.  Also noted, positive Hep C, will need f/u lab for viral load.

## 2014-03-27 NOTE — Telephone Encounter (Signed)
Pt aware of lab results Stated has F/U appointment on Feb 2

## 2014-04-03 ENCOUNTER — Ambulatory Visit: Payer: Self-pay | Attending: Family Medicine | Admitting: *Deleted

## 2014-04-03 VITALS — BP 156/74 | HR 81 | Temp 98.9°F | Resp 20 | Ht 63.0 in | Wt 173.0 lb

## 2014-04-03 DIAGNOSIS — M62831 Muscle spasm of calf: Secondary | ICD-10-CM

## 2014-04-03 DIAGNOSIS — I10 Essential (primary) hypertension: Secondary | ICD-10-CM | POA: Insufficient documentation

## 2014-04-03 NOTE — Progress Notes (Signed)
Patient presents for BP check Med list reviewed; states taking all meds as directed Discussed need for low sodium diet and using Mrs. Dash as alternative to salt. States doing much better choosing low sodium foods States feeling better, however, c/o "charlie horse" right calf and right quad last 2 days and dizziness after taking BP meds in AM that doesn't last long  BP 156/74 P 81 R  20 T  98.9 oral SPO2  100%  Wt 173 lb  Per PCP: Take norvasc at bedtime Bmet Will call regarding additional BP med once K+ result is known  Patient advised to call for med refills at least 7 days before running out so as not to go without.

## 2014-04-04 LAB — BASIC METABOLIC PANEL
BUN: 12 mg/dL (ref 6–23)
CALCIUM: 9.9 mg/dL (ref 8.4–10.5)
CHLORIDE: 104 meq/L (ref 96–112)
CO2: 26 mEq/L (ref 19–32)
CREATININE: 0.87 mg/dL (ref 0.50–1.10)
Glucose, Bld: 128 mg/dL — ABNORMAL HIGH (ref 70–99)
Potassium: 4.1 mEq/L (ref 3.5–5.3)
SODIUM: 139 meq/L (ref 135–145)

## 2014-04-05 ENCOUNTER — Telehealth: Payer: Self-pay | Admitting: *Deleted

## 2014-04-05 NOTE — Telephone Encounter (Signed)
Left voice message to return call 

## 2014-04-05 NOTE — Telephone Encounter (Signed)
Pt aware of lab results Stated change medication time intake and helped, no muscle spasms after changes. Taking Amlodipine at night now.

## 2014-04-05 NOTE — Telephone Encounter (Signed)
-----   Message from Minerva Ends, MD sent at 04/04/2014  4:01 PM EST ----- Normal BMP, no electrolytes If patient has not already please try tonic water for muscle spasms the quinine in the water helps

## 2014-04-12 NOTE — Telephone Encounter (Signed)
Noted! Thank you

## 2014-04-19 ENCOUNTER — Ambulatory Visit: Payer: Self-pay | Attending: Family Medicine | Admitting: Family Medicine

## 2014-04-19 ENCOUNTER — Encounter: Payer: Self-pay | Admitting: Family Medicine

## 2014-04-19 VITALS — BP 171/81 | HR 72 | Temp 98.7°F | Resp 16 | Ht 63.0 in | Wt 170.0 lb

## 2014-04-19 DIAGNOSIS — F329 Major depressive disorder, single episode, unspecified: Secondary | ICD-10-CM

## 2014-04-19 DIAGNOSIS — F32A Depression, unspecified: Secondary | ICD-10-CM

## 2014-04-19 DIAGNOSIS — F418 Other specified anxiety disorders: Secondary | ICD-10-CM

## 2014-04-19 DIAGNOSIS — I10 Essential (primary) hypertension: Secondary | ICD-10-CM

## 2014-04-19 DIAGNOSIS — F419 Anxiety disorder, unspecified: Secondary | ICD-10-CM

## 2014-04-19 MED ORDER — ALPRAZOLAM 0.5 MG PO TABS: 1.0000 mg | ORAL_TABLET | Freq: Two times a day (BID) | ORAL | Status: DC | PRN

## 2014-04-19 MED ORDER — ALPRAZOLAM 0.5 MG PO TABS: 0.2500 mg | ORAL_TABLET | Freq: Two times a day (BID) | ORAL | Status: DC | PRN

## 2014-04-19 MED ORDER — AMLODIPINE BESYLATE 10 MG PO TABS: 10.0000 mg | ORAL_TABLET | Freq: Every day | ORAL | Status: DC

## 2014-04-19 MED ORDER — LISINOPRIL 40 MG PO TABS: 40.0000 mg | ORAL_TABLET | Freq: Every day | ORAL | Status: DC

## 2014-04-19 MED ORDER — ESCITALOPRAM OXALATE 20 MG PO TABS: 20.0000 mg | ORAL_TABLET | Freq: Every day | ORAL | Status: DC

## 2014-04-19 MED ORDER — TRAZODONE HCL 50 MG PO TABS: 50.0000 mg | ORAL_TABLET | Freq: Every day | ORAL | Status: DC

## 2014-04-19 NOTE — Progress Notes (Signed)
Patient here for bp check  Patient report she has been taking her medications Patient requesting medication for anxiety Patient needs refills on medications Patient's sister passed away yesterday and patient worried her pressure is going to be elevated

## 2014-04-19 NOTE — Assessment & Plan Note (Signed)
A; BP above goal in the setting of grieving  P: Reassurance Continue current regimen Xanax prn x 2-3 week course Close f/u for BP check add chlorthalidone 25 mg q AM  if BP still elevated

## 2014-04-19 NOTE — Patient Instructions (Addendum)
Ms. Jurich,  I am so sorry the lost of your sister. Your blood pressure is elevated today but I suspect this is in part due to your grief.  Plan:  Continue current medications. I have increase lexapro to 20 mg daily.  New is xanax 0.5 mg take 1/2 tab-1 whole tab twice daily as needed for anxiety.  F/u in 2 weeks for BP check with nurse, take your medicine the evening before the visit.   F/u with me in 6 weeks   Dr. Adrian Blackwater

## 2014-04-19 NOTE — Progress Notes (Signed)
   Subjective:    Patient ID: [name], female    DOB: December 20, 1950, 64 y.o.   MRN: [id number] CC: f/u HTN, sister passed away 2 days ago and she is feeling anxious and sad HPI 64 yo F:  1. HTN: taking lisinopril and norvasc in the evening. Took doses last night. No HA, CP, SOB, swelling.   2. Anxiety and depression: taking lexapro and trazodone. Tolerating the medication. Admits to SI nearly everyday. No plan.   3. Grieving: her sister passed away two days ago in [place]. Patient is youngest of 4. Sister was in a rest home with crippling arthritis. Remer Macho is on Sunday. Patient is riding to [place] with her two older brothers this weekend. She is very tearful, worried, not sleeping well.   Soc Hx: non smoker  Review of Systems As per HPI  GAD-7: score of 21. 3s across the board.     Objective:   Physical Exam BP 171/81 mmHg  Pulse 72  Temp(Src) 98.7 F (37.1 C)  Resp 16  Ht 5\' 3"  (1.6 m)  Wt 170 lb (77.111 kg)  BMI 30.12 kg/m2  SpO2 100% General appearance: alert, cooperative and no distress, tearful during exam  Lungs: clear to auscultation bilaterally Heart: regular rate and rhythm, S1, S2 normal, no murmur, click, rub or gallop Extremities: extremities normal, atraumatic, no cyanosis or edema       Assessment & Plan:

## 2014-04-19 NOTE — Assessment & Plan Note (Signed)
A: worsening anxiety and depressed mood due to lost of sister P: Increase lexapro to 20 mg daily Continue nightly trazodone 50 mg nightly  Add xanax for prn use, short term

## 2014-05-04 ENCOUNTER — Ambulatory Visit: Payer: Self-pay | Attending: Family Medicine | Admitting: *Deleted

## 2014-05-04 VITALS — BP 125/68 | HR 77 | Temp 98.4°F | Resp 18 | Wt 169.2 lb

## 2014-05-04 DIAGNOSIS — I10 Essential (primary) hypertension: Secondary | ICD-10-CM | POA: Insufficient documentation

## 2014-05-04 NOTE — Progress Notes (Signed)
Patient presents for BP check Med list reviewed; states taking all meds as directed Discussed need for low sodium diet and using Mrs. Dash as alternative to salt States she's given up sodas, canned foods and cutting back on sweets Encouraged to choose foods with 5% or less of daily value for sodium. Walking 30-45 minutes per day for exercise Patient c/o occassional headaches, denies blurred vision, SHOB, chest pain or pressure States she feels great except for allergies and PND  BP 125/68 Right arm  P 77 R 18  T  98.4 oral SPO2  100%  Patient advised to call for med refills at least 7 days before running out so as not to go without.  Patient aware that she is to f/u with PCP 3 months from last visit (Due 05/31/14)  Patient given literature on DASH Eating Plan

## 2014-05-04 NOTE — Patient Instructions (Signed)
DASH Eating Plan °DASH stands for "Dietary Approaches to Stop Hypertension." The DASH eating plan is a healthy eating plan that has been shown to reduce high blood pressure (hypertension). Additional health benefits may include reducing the risk of type 2 diabetes mellitus, heart disease, and stroke. The DASH eating plan may also help with weight loss. °WHAT DO I NEED TO KNOW ABOUT THE DASH EATING PLAN? °For the DASH eating plan, you will follow these general guidelines: °· Choose foods with a percent daily value for sodium of less than 5% (as listed on the food label). °· Use salt-free seasonings or herbs instead of table salt or sea salt. °· Check with your health care provider or pharmacist before using salt substitutes. °· Eat lower-sodium products, often labeled as "lower sodium" or "no salt added." °· Eat fresh foods. °· Eat more vegetables, fruits, and low-fat dairy products. °· Choose whole grains. Look for the word "whole" as the first word in the ingredient list. °· Choose fish and skinless chicken or turkey more often than red meat. Limit fish, poultry, and meat to 6 oz (170 g) each day. °· Limit sweets, desserts, sugars, and sugary drinks. °· Choose heart-healthy fats. °· Limit cheese to 1 oz (28 g) per day. °· Eat more home-cooked food and less restaurant, buffet, and fast food. °· Limit fried foods. °· Cook foods using methods other than frying. °· Limit canned vegetables. If you do use them, rinse them well to decrease the sodium. °· When eating at a restaurant, ask that your food be prepared with less salt, or no salt if possible. °WHAT FOODS CAN I EAT? °Seek help from a dietitian for individual calorie needs. °Grains °Whole grain or whole wheat bread. Brown rice. Whole grain or whole wheat pasta. Quinoa, bulgur, and whole grain cereals. Low-sodium cereals. Corn or whole wheat flour tortillas. Whole grain cornbread. Whole grain crackers. Low-sodium crackers. °Vegetables °Fresh or frozen vegetables  (raw, steamed, roasted, or grilled). Low-sodium or reduced-sodium tomato and vegetable juices. Low-sodium or reduced-sodium tomato sauce and paste. Low-sodium or reduced-sodium canned vegetables.  °Fruits °All fresh, canned (in natural juice), or frozen fruits. °Meat and Other Protein Products °Ground beef (85% or leaner), grass-fed beef, or beef trimmed of fat. Skinless chicken or turkey. Ground chicken or turkey. Pork trimmed of fat. All fish and seafood. Eggs. Dried beans, peas, or lentils. Unsalted nuts and seeds. Unsalted canned beans. °Dairy °Low-fat dairy products, such as skim or 1% milk, 2% or reduced-fat cheeses, low-fat ricotta or cottage cheese, or plain low-fat yogurt. Low-sodium or reduced-sodium cheeses. °Fats and Oils °Tub margarines without trans fats. Light or reduced-fat mayonnaise and salad dressings (reduced sodium). Avocado. Safflower, olive, or canola oils. Natural peanut or almond butter. °Other °Unsalted popcorn and pretzels. °The items listed above may not be a complete list of recommended foods or beverages. Contact your dietitian for more options. °WHAT FOODS ARE NOT RECOMMENDED? °Grains °White bread. White pasta. White rice. Refined cornbread. Bagels and croissants. Crackers that contain trans fat. °Vegetables °Creamed or fried vegetables. Vegetables in a cheese sauce. Regular canned vegetables. Regular canned tomato sauce and paste. Regular tomato and vegetable juices. °Fruits °Dried fruits. Canned fruit in light or heavy syrup. Fruit juice. °Meat and Other Protein Products °Fatty cuts of meat. Ribs, chicken wings, bacon, sausage, bologna, salami, chitterlings, fatback, hot dogs, bratwurst, and packaged luncheon meats. Salted nuts and seeds. Canned beans with salt. °Dairy °Whole or 2% milk, cream, half-and-half, and cream cheese. Whole-fat or sweetened yogurt. Full-fat   cheeses or blue cheese. Nondairy creamers and whipped toppings. Processed cheese, cheese spreads, or cheese  curds. °Condiments °Onion and garlic salt, seasoned salt, table salt, and sea salt. Canned and packaged gravies. Worcestershire sauce. Tartar sauce. Barbecue sauce. Teriyaki sauce. Soy sauce, including reduced sodium. Steak sauce. Fish sauce. Oyster sauce. Cocktail sauce. Horseradish. Ketchup and mustard. Meat flavorings and tenderizers. Bouillon cubes. Hot sauce. Tabasco sauce. Marinades. Taco seasonings. Relishes. °Fats and Oils °Butter, stick margarine, lard, shortening, ghee, and bacon fat. Coconut, palm kernel, or palm oils. Regular salad dressings. °Other °Pickles and olives. Salted popcorn and pretzels. °The items listed above may not be a complete list of foods and beverages to avoid. Contact your dietitian for more information. °WHERE CAN I FIND MORE INFORMATION? °National Heart, Lung, and Blood Institute: www.nhlbi.nih.gov/health/health-topics/topics/dash/ °Document Released: 02/05/2011 Document Revised: 07/03/2013 Document Reviewed: 12/21/2012 °ExitCare® Patient Information ©2015 [employer]. This information is not intended to replace advice given to you by your health care provider. Make sure you discuss any questions you have with your health care provider. ° °

## 2014-05-25 ENCOUNTER — Telehealth: Payer: Self-pay | Admitting: Emergency Medicine

## 2014-05-25 ENCOUNTER — Encounter: Payer: Self-pay | Admitting: Family Medicine

## 2014-05-25 ENCOUNTER — Ambulatory Visit: Payer: Self-pay | Attending: Family Medicine | Admitting: Family Medicine

## 2014-05-25 VITALS — BP 187/80 | HR 100 | Temp 98.5°F | Resp 18

## 2014-05-25 DIAGNOSIS — F4321 Adjustment disorder with depressed mood: Secondary | ICD-10-CM

## 2014-05-25 MED ORDER — LORAZEPAM 0.5 MG PO TABS: 0.5000 mg | ORAL_TABLET | Freq: Three times a day (TID) | ORAL | Status: DC | PRN

## 2014-05-25 NOTE — Telephone Encounter (Signed)
Patient called the after hours line and stated that her son passed away and she would like her "Sedative" Refilled if possible.

## 2014-05-25 NOTE — Telephone Encounter (Signed)
Patient seen today

## 2014-05-25 NOTE — Assessment & Plan Note (Signed)
1. Grieving:  Ativan for anxiety and sleep.  Do not drive or drink alcohol while taking ativan.  When you get back in town please call the number below.   The Counseling and [facility] [place], [place] [address] [phone] Offers short-term grief counseling for individuals and families. [employer] of books/videos on coping with loss, grief and life-limiting illnesses is available. Workshops and special events available   F/u in 4 weeks with me for your mood.

## 2014-05-25 NOTE — Progress Notes (Signed)
   Subjective:    Patient ID: [name], female    DOB: January 25, 1951, 64 y.o.   MRN: [id number] CC: son died this AM  HPI  1. Son died: son died in [place] this AM in [place]. He is 63 years old. He leaves behind a new baby girl. Patient is tearful, nervous, very upset. Her sister died 3 weeks ago.   Soc Hx: former smoker, quit   Review of Systems  Respiratory: Negative for chest tightness and shortness of breath.   Cardiovascular: Negative for chest pain.  Psychiatric/Behavioral: The patient is nervous/anxious.       Objective:   Physical Exam BP 187/80 mmHg  Pulse 100  Temp(Src) 98.5 F (36.9 C)  Resp 18 General appearance: alert, cooperative and mild distress, tearful and intermittently shaky  Lungs: normal WOB   Psych: tearful. Coherent   Clonidine 0.1 mg x one given for HTN        Assessment & Plan:

## 2014-05-25 NOTE — Patient Instructions (Signed)
Mrs. Cuthbert,  Thank you for coming in today.  1. Grieving:  Ativan for anxiety and sleep.  Do not drive or drink alcohol while taking ativan.  When you get back in town please call the number below.   The [facility] and [facility] [place], [place] [address] [phone] Offers short-term grief counseling for individuals and families. [employer] of books/videos on coping with loss, grief and life-limiting illnesses is available. Workshops and special events available   F/u in 4 weeks with me for your mood.   Dr. Adrian Blackwater

## 2014-09-06 ENCOUNTER — Ambulatory Visit: Payer: Self-pay | Admitting: Family Medicine

## 2014-09-14 ENCOUNTER — Ambulatory Visit: Payer: Self-pay | Attending: Family Medicine | Admitting: Family Medicine

## 2014-09-14 ENCOUNTER — Encounter: Payer: Self-pay | Admitting: Family Medicine

## 2014-09-14 VITALS — BP 146/71 | HR 71 | Temp 98.7°F | Resp 16 | Ht 63.0 in | Wt 160.0 lb

## 2014-09-14 DIAGNOSIS — F32A Depression, unspecified: Secondary | ICD-10-CM

## 2014-09-14 DIAGNOSIS — M79644 Pain in right finger(s): Secondary | ICD-10-CM | POA: Insufficient documentation

## 2014-09-14 DIAGNOSIS — Z87891 Personal history of nicotine dependence: Secondary | ICD-10-CM | POA: Insufficient documentation

## 2014-09-14 DIAGNOSIS — F329 Major depressive disorder, single episode, unspecified: Secondary | ICD-10-CM

## 2014-09-14 DIAGNOSIS — F419 Anxiety disorder, unspecified: Secondary | ICD-10-CM

## 2014-09-14 DIAGNOSIS — M19041 Primary osteoarthritis, right hand: Secondary | ICD-10-CM | POA: Insufficient documentation

## 2014-09-14 DIAGNOSIS — Z560 Unemployment, unspecified: Secondary | ICD-10-CM | POA: Insufficient documentation

## 2014-09-14 DIAGNOSIS — F418 Other specified anxiety disorders: Secondary | ICD-10-CM

## 2014-09-14 DIAGNOSIS — I10 Essential (primary) hypertension: Secondary | ICD-10-CM | POA: Insufficient documentation

## 2014-09-14 MED ORDER — ESCITALOPRAM OXALATE 20 MG PO TABS: 20.0000 mg | ORAL_TABLET | Freq: Every day | ORAL | Status: DC

## 2014-09-14 MED ORDER — ACETAMINOPHEN ER 650 MG PO TBCR: 650.0000 mg | EXTENDED_RELEASE_TABLET | Freq: Three times a day (TID) | ORAL | Status: DC | PRN

## 2014-09-14 MED ORDER — TRAZODONE HCL 50 MG PO TABS: 25.0000 mg | ORAL_TABLET | Freq: Every day | ORAL | Status: DC

## 2014-09-14 NOTE — Assessment & Plan Note (Signed)
R hand OA: Start with tylenol up to 3 times daily as needed Will add NSAID if tylenol does not control symptoms

## 2014-09-14 NOTE — Progress Notes (Addendum)
   Subjective:    Patient ID: [name], female    DOB: [birth year], 64 y.o.   MRN: [id number] CC; f/u HTN  HPI  1. CHRONIC HYPERTENSION  Disease Monitoring  Blood pressure range: not checking   Chest pain: no   Dyspnea: no   Claudication: no   Medication compliance: yes  Medication Side Effects  Lightheadedness: no   Urinary frequency: no   Edema: no    2. R 5th finger pain: pain and stiffness in R 5th finger x one month.  With joint swelling. Patient is R handed.  3. Financial difficulties: patient is unemployed and having trouble with rent and bills. Looking for work. Stressed about finances. Does not sleep well. Trazodone helps. Overslept when she took 50 mg of trazodone.   Soc Hx: former smoker quit in 1996 Review of Systems  Constitutional: Negative for fever and chills.  Respiratory: Negative for shortness of breath.   Cardiovascular: Negative for chest pain and leg swelling.  Musculoskeletal: Positive for joint swelling and arthralgias.  Skin: Negative for rash.  Psychiatric/Behavioral: Positive for sleep disturbance and dysphoric mood. Negative for suicidal ideas. The patient is nervous/anxious.    GAD-7: score of 20. 2-5, 3 to all others     Objective:   Physical Exam BP 146/71 mmHg  Pulse 71  Temp(Src) 98.7 F (37.1 C) (Oral)  Resp 16  Ht 5\' 3"  (1.6 m)  Wt 160 lb (72.576 kg)  BMI 28.35 kg/m2  SpO2 100% General appearance: alert, cooperative and no distress Lungs: clear to auscultation bilaterally Heart: regular rate and rhythm, S1, S2 normal, no murmur, click, rub or gallop Extremities: extremities normal, atraumatic, no cyanosis or edema R hand: swelling at MCP and PIP joints with stiffness of 5th digit no erythema or effusion     Assessment & Plan:

## 2014-09-14 NOTE — Patient Instructions (Signed)
Ms. Petrides,  Thank you for coming in today.  1. HTN: BP well controlled  Continue current regimen   2. R hand OA: Start with tylenol up to 3 times daily as needed Will add NSAID if tylenol does not control symptoms  3. Anxiety with poor sleep: Continue lexapro and trazodone 25-50 mg nightly as needed  F/u in 2 months for flu shot, HTN and R had OA   Dr. Adrian Blackwater

## 2014-09-14 NOTE — Assessment & Plan Note (Signed)
HTN: BP well controlled  Med: compliant Continue current regimen

## 2014-09-14 NOTE — Progress Notes (Signed)
F/U HTN Complaining of pain on rt hand 5th finger not moving x1 month No hx Tobacco  Refills Ativan and Trazodone

## 2014-09-14 NOTE — Assessment & Plan Note (Signed)
Anxiety with poor sleep: responsive to treatment  Continue lexapro and trazodone 25-50 mg nightly as needed

## 2014-09-18 ENCOUNTER — Ambulatory Visit: Payer: Self-pay | Attending: Family Medicine

## 2014-09-21 ENCOUNTER — Other Ambulatory Visit: Payer: Self-pay | Admitting: Family Medicine

## 2014-10-16 ENCOUNTER — Other Ambulatory Visit: Payer: Self-pay | Admitting: Family Medicine

## 2014-10-22 ENCOUNTER — Ambulatory Visit: Payer: Self-pay | Attending: Family Medicine | Admitting: *Deleted

## 2014-10-22 VITALS — BP 154/66 | HR 71 | Temp 98.1°F | Resp 16 | Ht 63.5 in | Wt 160.2 lb

## 2014-10-22 DIAGNOSIS — Z87891 Personal history of nicotine dependence: Secondary | ICD-10-CM | POA: Insufficient documentation

## 2014-10-22 DIAGNOSIS — I10 Essential (primary) hypertension: Secondary | ICD-10-CM | POA: Insufficient documentation

## 2014-10-22 MED ORDER — LISINOPRIL 40 MG PO TABS: 40.0000 mg | ORAL_TABLET | Freq: Every day | ORAL | Status: DC

## 2014-10-22 MED ORDER — AMLODIPINE BESYLATE 10 MG PO TABS: 10.0000 mg | ORAL_TABLET | Freq: Every day | ORAL | Status: DC

## 2014-10-22 NOTE — Progress Notes (Signed)
Patient presents for BP check after running out of amlodipine 10 mg and lisinopril 40 mg 2 days ago States she called for refills 6 days ago and had not heard back Med list reviewed; states taking all meds as directed except as above Patient is not adding salt to foods or cooking with salt and is aware of Mrs Deliah Boston as alternative to salt. Eats fresh foods only Patient walking 2-3 miles per day for exercise Patient denies blurred vision, SHOB, chest pain  Positive for headache X 2 days; rates 5/10 at present  Filed Vitals:   10/22/14 1206  BP: 154/66  Pulse: 71  Temp: 98.1 F (36.7 C)  Resp: 16     Amlodipine and lisinopril e-scribed to [facility]  Patient will restart meds today and f/u with PCP around 11/15/14 for HTN, R hand OA and flu shot

## 2014-11-14 ENCOUNTER — Encounter: Payer: Self-pay | Admitting: Family Medicine

## 2014-11-14 ENCOUNTER — Ambulatory Visit: Payer: Self-pay | Attending: Family Medicine | Admitting: Family Medicine

## 2014-11-14 VITALS — BP 140/72 | HR 71 | Temp 98.5°F | Resp 99 | Ht 63.0 in | Wt 159.0 lb

## 2014-11-14 DIAGNOSIS — Z0000 Encounter for general adult medical examination without abnormal findings: Secondary | ICD-10-CM | POA: Insufficient documentation

## 2014-11-14 DIAGNOSIS — F329 Major depressive disorder, single episode, unspecified: Secondary | ICD-10-CM

## 2014-11-14 DIAGNOSIS — F419 Anxiety disorder, unspecified: Secondary | ICD-10-CM

## 2014-11-14 DIAGNOSIS — F32A Depression, unspecified: Secondary | ICD-10-CM

## 2014-11-14 DIAGNOSIS — F418 Other specified anxiety disorders: Secondary | ICD-10-CM | POA: Insufficient documentation

## 2014-11-14 DIAGNOSIS — I10 Essential (primary) hypertension: Secondary | ICD-10-CM | POA: Insufficient documentation

## 2014-11-14 DIAGNOSIS — M19041 Primary osteoarthritis, right hand: Secondary | ICD-10-CM | POA: Insufficient documentation

## 2014-11-14 MED ORDER — ESCITALOPRAM OXALATE 20 MG PO TABS: 20.0000 mg | ORAL_TABLET | Freq: Every day | ORAL | Status: DC

## 2014-11-14 MED ORDER — ACETAMINOPHEN ER 650 MG PO TBCR
650.0000 mg | EXTENDED_RELEASE_TABLET | Freq: Three times a day (TID) | ORAL | Status: DC | PRN
Start: 2014-11-14 — End: 2017-10-11

## 2014-11-14 MED ORDER — LISINOPRIL 40 MG PO TABS
40.0000 mg | ORAL_TABLET | Freq: Every day | ORAL | Status: DC
Start: 2014-11-14 — End: 2015-04-19

## 2014-11-14 MED ORDER — AMLODIPINE BESYLATE 10 MG PO TABS: 10.0000 mg | ORAL_TABLET | Freq: Every day | ORAL | Status: DC

## 2014-11-14 NOTE — Progress Notes (Signed)
F/U HTN  Taking medication as prescribed No Hx tobacco

## 2014-11-14 NOTE — Progress Notes (Signed)
Patient ID: [name], female   DOB: June 08, 1950, 64 y.o.   MRN: [id number]   Subjective:  Patient ID: [name], female    DOB: 18-Nov-1950  Age: 64 y.o. MRN: [id number]  CC: Follow-up  HPI [name] presents for HTN and osteoarthritis  1. CHRONIC HYPERTENSION  Disease Monitoring  Blood pressure range: not checking   Chest pain: no    Dyspnea: no   Claudication: no   Medication compliance: yes  Medication Side Effects  Lightheadedness: no   Urinary frequency: no   Edema: no     2. Hand pain: has mild swelling on dorsum of R hand. Joint swelling. Taking tylenol 650 mg TID with improvement in pain.   3. Anxiety: taking lexapro daily. Symptoms have improved. Patient will start a new job next week.   4. Hep C: patient does not desire treatment at this time. Viral load is unknown. No swelling in legs or GI upset.   Outpatient Prescriptions Prior to Visit  Medication Sig Dispense Refill  . acetaminophen (TYLENOL 8 HOUR) 650 MG CR tablet Take 1 tablet (650 mg total) by mouth every 8 (eight) hours as needed for pain. 60 tablet 2  . amLODipine (NORVASC) 10 MG tablet Take 1 tablet (10 mg total) by mouth daily. 90 tablet 1  . escitalopram (LEXAPRO) 20 MG tablet Take 1 tablet (20 mg total) by mouth daily. 30 tablet 5  . lisinopril (PRINIVIL,ZESTRIL) 40 MG tablet Take 1 tablet (40 mg total) by mouth daily. 90 tablet 1  . traZODone (DESYREL) 50 MG tablet Take 0.5-1 tablets (25-50 mg total) by mouth at bedtime. 30 tablet 5  . LORazepam (ATIVAN) 0.5 MG tablet Take 1 tablet (0.5 mg total) by mouth every 8 (eight) hours as needed for anxiety or sleep. (Patient not taking: Reported on 11/14/2014) 30 tablet 0   No facility-administered medications prior to visit.    ROS Review of Systems  Constitutional: Negative for fever and chills.  Eyes: Negative for visual disturbance.  Respiratory: Negative for shortness of breath.   Cardiovascular: Negative for chest pain.    Gastrointestinal: Negative for abdominal pain and blood in stool.  Musculoskeletal: Positive for joint swelling and arthralgias. Negative for back pain.  Skin: Negative for rash.  Allergic/Immunologic: Negative for immunocompromised state.  Hematological: Negative for adenopathy. Does not bruise/bleed easily.  Psychiatric/Behavioral: Negative for suicidal ideas and dysphoric mood.    Objective:  BP 140/72 mmHg  Pulse 71  Temp(Src) 98.5 F (36.9 C) (Oral)  Resp 99  Ht 5\' 3"  (1.6 m)  Wt 159 lb (72.122 kg)  BMI 28.17 kg/m2  SpO2 100%  BP/Weight 11/14/2014 10/22/2014 1/60/1093  Systolic BP 235 573 220  Diastolic BP 72 66 71  Wt. (Lbs) 159 160.2 160  BMI 28.17 27.93 28.35   Physical Exam  Constitutional: She is oriented to person, place, and time. She appears well-developed and well-nourished. No distress.  HENT:  Head: Normocephalic and atraumatic.  Cardiovascular: Normal rate, regular rhythm, normal heart sounds and intact distal pulses.   Pulmonary/Chest: Effort normal and breath sounds normal.  Musculoskeletal: She exhibits edema and tenderness.       Right hand: She exhibits tenderness, bony tenderness and swelling.       Left hand: She exhibits tenderness, deformity and swelling.  Neurological: She is alert and oriented to person, place, and time.  Skin: Skin is warm and dry. No rash noted.  Psychiatric: She has a normal mood and affect.  Assessment & Plan:   Problem List Items Addressed This Visit    Anxiety and depression (Chronic)   Relevant Medications   escitalopram (LEXAPRO) 20 MG tablet   Healthcare maintenance - Primary   Relevant Orders   MM DIGITAL SCREENING BILATERAL   HTN (hypertension) (Chronic)   Relevant Medications   amLODipine (NORVASC) 10 MG tablet   lisinopril (PRINIVIL,ZESTRIL) 40 MG tablet   Osteoarthritis of hand, right   Relevant Medications   acetaminophen (TYLENOL 8 HOUR) 650 MG CR tablet      No orders of the defined types were  placed in this encounter.    Follow-up: Return in about 3 months (around 02/13/2015).   Boykin Nearing MD

## 2014-11-14 NOTE — Patient Instructions (Addendum)
Ms. Midgley,  Thank you for coming in today.  1. HTN: BP well controlled  Continue current regimen   2. R hand OA: Start with tylenol up to 3 times daily as needed   3. Anxiety with poor sleep: Continue lexapro and trazodone 25-50 mg nightly as needed  4. Healthcare maintenance: Mammogram ordered Mammogram is covered by orange card if you do not have the orange card,   Please call Rolena Infante, [phone],  with the BCCCP (breast and cervical cancer control program) at the [facility] to set up an appointment to verify eligibility for a breast exam, mammogram, ultrasound. If you qualify this will be set up at [facility].  F/u for flu shot with flu clinic 2-5 PM F/u with me in 3 months for HTN   Dr. Adrian Blackwater

## 2014-11-27 ENCOUNTER — Ambulatory Visit: Payer: Self-pay | Attending: Family Medicine

## 2014-11-27 DIAGNOSIS — Z23 Encounter for immunization: Secondary | ICD-10-CM | POA: Insufficient documentation

## 2014-11-27 DIAGNOSIS — Z0000 Encounter for general adult medical examination without abnormal findings: Secondary | ICD-10-CM

## 2015-02-05 ENCOUNTER — Encounter: Payer: Self-pay | Admitting: Family Medicine

## 2015-02-05 ENCOUNTER — Ambulatory Visit: Payer: Self-pay | Attending: Family Medicine | Admitting: Family Medicine

## 2015-02-05 VITALS — BP 157/75 | HR 65 | Temp 98.8°F | Resp 16 | Ht 63.0 in | Wt 163.0 lb

## 2015-02-05 DIAGNOSIS — F419 Anxiety disorder, unspecified: Principal | ICD-10-CM

## 2015-02-05 DIAGNOSIS — J309 Allergic rhinitis, unspecified: Secondary | ICD-10-CM | POA: Insufficient documentation

## 2015-02-05 DIAGNOSIS — F329 Major depressive disorder, single episode, unspecified: Secondary | ICD-10-CM

## 2015-02-05 DIAGNOSIS — M19042 Primary osteoarthritis, left hand: Secondary | ICD-10-CM | POA: Insufficient documentation

## 2015-02-05 DIAGNOSIS — F32A Depression, unspecified: Secondary | ICD-10-CM

## 2015-02-05 DIAGNOSIS — J302 Other seasonal allergic rhinitis: Secondary | ICD-10-CM

## 2015-02-05 DIAGNOSIS — M19041 Primary osteoarthritis, right hand: Secondary | ICD-10-CM

## 2015-02-05 DIAGNOSIS — F418 Other specified anxiety disorders: Secondary | ICD-10-CM

## 2015-02-05 LAB — URIC ACID: URIC ACID, SERUM: 3.8 mg/dL (ref 2.4–7.0)

## 2015-02-05 MED ORDER — NAPROXEN 500 MG PO TABS: 500.0000 mg | ORAL_TABLET | Freq: Two times a day (BID) | ORAL | Status: DC

## 2015-02-05 MED ORDER — CETIRIZINE HCL 10 MG PO TABS: 10.0000 mg | ORAL_TABLET | Freq: Every day | ORAL | Status: DC

## 2015-02-05 MED ORDER — TRAZODONE HCL 50 MG PO TABS: 25.0000 mg | ORAL_TABLET | Freq: Every day | ORAL | Status: DC

## 2015-02-05 MED ORDER — IBUPROFEN 200 MG PO TABS
600.0000 mg | ORAL_TABLET | Freq: Once | ORAL | Status: AC
Start: 2015-02-05 — End: 2015-02-05
  Administered 2015-02-05: 600 mg via ORAL

## 2015-02-05 MED ORDER — WRIST SPLINT/ELASTIC LEFT MED MISC: 1.0000 | Freq: Every day | Status: AC

## 2015-02-05 MED ORDER — WRIST SPLINT/ELASTIC RIGHT MED MISC: 1.0000 | Freq: Every day | Status: AC

## 2015-02-05 MED ORDER — ACETAMINOPHEN-CODEINE #3 300-30 MG PO TABS: 1.0000 | ORAL_TABLET | Freq: Three times a day (TID) | ORAL | Status: DC | PRN

## 2015-02-05 NOTE — Progress Notes (Signed)
Patient ID: [name], female   DOB: 28-May-1950, 64 y.o.   MRN: [id number]   Subjective:  Patient ID: [name], female    DOB: [birth year]  Age: 64 y.o. MRN: [id number]  CC: Hand Pain   HPI FRANCHESCA VINT presents for    1. Pain and numbness in both hand and wrist. R >L. No trauma. Pain is moderate to severe. Entire hand. No redness. Some joint swelling.   2. HTN: BP elevated. She took her medicine today. She is in pain. No HA, CP, SOB or vision changes.   Social History  Substance Use Topics  . Smoking status: Former Smoker    Quit date: 03/20/1994  . Smokeless tobacco: Never Used  . Alcohol Use: No    Outpatient Prescriptions Prior to Visit  Medication Sig Dispense Refill  . acetaminophen (TYLENOL 8 HOUR) 650 MG CR tablet Take 1 tablet (650 mg total) by mouth every 8 (eight) hours as needed for pain. 60 tablet 2  . amLODipine (NORVASC) 10 MG tablet Take 1 tablet (10 mg total) by mouth daily. 30 tablet 5  . cetirizine (ZYRTEC) 10 MG tablet Take 10 mg by mouth daily.    Marland Kitchen escitalopram (LEXAPRO) 20 MG tablet Take 1 tablet (20 mg total) by mouth daily. 30 tablet 5  . lisinopril (PRINIVIL,ZESTRIL) 40 MG tablet Take 1 tablet (40 mg total) by mouth daily. 30 tablet 5  . traZODone (DESYREL) 50 MG tablet Take 0.5-1 tablets (25-50 mg total) by mouth at bedtime. 30 tablet 5   No facility-administered medications prior to visit.    ROS Review of Systems  Constitutional: Negative for fever and chills.  Eyes: Negative for visual disturbance.  Respiratory: Negative for shortness of breath.   Cardiovascular: Negative for chest pain.  Gastrointestinal: Negative for abdominal pain and blood in stool.  Musculoskeletal: Positive for arthralgias. Negative for back pain.  Skin: Negative for rash.  Allergic/Immunologic: Negative for immunocompromised state.  Neurological: Positive for numbness.  Hematological: Negative for adenopathy. Does not bruise/bleed easily.    Psychiatric/Behavioral: Negative for suicidal ideas and dysphoric mood.    Objective:  BP 157/75 mmHg  Pulse 65  Temp(Src) 98.8 F (37.1 C) (Oral)  Resp 16  Ht 5\' 3"  (1.6 m)  Wt 163 lb (73.936 kg)  BMI 28.88 kg/m2  SpO2 100%  BP/Weight 02/05/2015 11/14/2014 99991111  Systolic BP [id number]  Diastolic BP 75 72 66  Wt. (Lbs) 163 159 160.2  BMI 28.88 28.17 27.93    Physical Exam  Constitutional: She is oriented to person, place, and time. She appears well-developed and well-nourished. No distress.  HENT:  Head: Normocephalic and atraumatic.  Cardiovascular: Normal rate, regular rhythm, normal heart sounds and intact distal pulses.   Pulmonary/Chest: Effort normal and breath sounds normal.  Musculoskeletal: She exhibits tenderness. She exhibits no edema.  Tenderness in R hand and wrist   Neurological: She is alert and oriented to person, place, and time.  Skin: Skin is warm and dry. No rash noted.  Psychiatric: She has a normal mood and affect.    Assessment & Plan:   Problem List Items Addressed This Visit    Allergic rhinitis   Relevant Medications   cetirizine (ZYRTEC) 10 MG tablet   Anxiety and depression - Primary (Chronic)   Relevant Medications   traZODone (DESYREL) 50 MG tablet   Osteoarthritis of hand, right (Chronic)   Relevant Medications   ibuprofen (ADVIL,MOTRIN) tablet 600 mg (Completed)   naproxen (NAPROSYN)  500 MG tablet   acetaminophen-codeine (TYLENOL #3) 300-30 MG tablet   Elastic Bandages & Supports (WRIST SPLINT/ELASTIC LEFT MED) MISC   Elastic Bandages & Supports (WRIST SPLINT/ELASTIC RIGHT MED) MISC   Other Relevant Orders   Uric Acid (Completed)   Ambulatory referral to Physical Therapy   Osteoarthritis of left hand   Relevant Medications   ibuprofen (ADVIL,MOTRIN) tablet 600 mg (Completed)   naproxen (NAPROSYN) 500 MG tablet   acetaminophen-codeine (TYLENOL #3) 300-30 MG tablet   Elastic Bandages & Supports (WRIST SPLINT/ELASTIC LEFT  MED) MISC   Elastic Bandages & Supports (WRIST SPLINT/ELASTIC RIGHT MED) MISC   Other Relevant Orders   Uric Acid (Completed)   Ambulatory referral to Physical Therapy      No orders of the defined types were placed in this encounter.    Follow-up: No Follow-up on file.   Boykin Nearing MD

## 2015-02-05 NOTE — Progress Notes (Signed)
C/C Hand and wrist pain . nubness around finger. Worsen in Rt hand  Pain scale #8 No tobacco user  No suicidal thought in the past to week

## 2015-02-05 NOTE — Patient Instructions (Addendum)
Diagnoses and all orders for this visit:  Anxiety and depression -     traZODone (DESYREL) 50 MG tablet; Take 0.5-1 tablets (25-50 mg total) by mouth at bedtime.  Other seasonal allergic rhinitis -     cetirizine (ZYRTEC) 10 MG tablet; Take 1 tablet (10 mg total) by mouth daily.  Primary osteoarthritis of right hand -     ibuprofen (ADVIL,MOTRIN) tablet 600 mg; Take 3 tablets (600 mg total) by mouth once. -     naproxen (NAPROSYN) 500 MG tablet; Take 1 tablet (500 mg total) by mouth 2 (two) times daily with a meal. For 3-5 days, then take a break for 1 week at least -     acetaminophen-codeine (TYLENOL #3) 300-30 MG tablet; Take 1 tablet by mouth every 8 (eight) hours as needed for moderate pain. -     Uric Acid -     Ambulatory referral to Physical Therapy -     Elastic Bandages & Supports (WRIST SPLINT/ELASTIC LEFT MED) MISC; 1 each by Does not apply route daily. -     Elastic Bandages & Supports (WRIST SPLINT/ELASTIC RIGHT MED) MISC; 1 each by Does not apply route daily.  Primary osteoarthritis of left hand -     ibuprofen (ADVIL,MOTRIN) tablet 600 mg; Take 3 tablets (600 mg total) by mouth once. -     naproxen (NAPROSYN) 500 MG tablet; Take 1 tablet (500 mg total) by mouth 2 (two) times daily with a meal. For 3-5 days, then take a break for 1 week at least -     acetaminophen-codeine (TYLENOL #3) 300-30 MG tablet; Take 1 tablet by mouth every 8 (eight) hours as needed for moderate pain. -     Uric Acid -     Ambulatory referral to Physical Therapy -     Elastic Bandages & Supports (WRIST SPLINT/ELASTIC LEFT MED) MISC; 1 each by Does not apply route daily. -     Elastic Bandages & Supports (WRIST SPLINT/ELASTIC RIGHT MED) MISC; 1 each by Does not apply route daily.   F/u in 2 weeks for BP recheck with pharmacist  F/u in 4-6 weeks for wrist pain   Dr. Adrian Blackwater

## 2015-02-08 ENCOUNTER — Telehealth: Payer: Self-pay | Admitting: *Deleted

## 2015-02-08 NOTE — Telephone Encounter (Signed)
-----   Message from Boykin Nearing, MD sent at 02/06/2015  8:43 AM EST ----- Normal uric acid

## 2015-02-08 NOTE — Telephone Encounter (Signed)
LVM to return call.

## 2015-02-08 NOTE — Telephone Encounter (Signed)
Date of birth verified by  pt  Normal labs results given  Pt verbalized understanding

## 2015-03-19 ENCOUNTER — Other Ambulatory Visit: Payer: Self-pay | Admitting: Family Medicine

## 2015-03-19 DIAGNOSIS — R928 Other abnormal and inconclusive findings on diagnostic imaging of breast: Secondary | ICD-10-CM

## 2015-03-25 MED FILL — AMLODIPINE BESYLATE 10 MG T: 10 | 30 days supply | Qty: 30 | Fill #4

## 2015-03-25 MED FILL — traZODone HCL 50 MG TABS: 50 | 30 days supply | Qty: 30 | Fill #3

## 2015-03-25 MED FILL — LISINOPRIL 40 MG TABLET: 40 | 30 days supply | Qty: 30 | Fill #5

## 2015-04-19 ENCOUNTER — Telehealth: Payer: Self-pay | Admitting: Family Medicine

## 2015-04-19 ENCOUNTER — Encounter: Payer: Self-pay | Admitting: Family Medicine

## 2015-04-19 ENCOUNTER — Encounter: Payer: Self-pay | Admitting: Clinical

## 2015-04-19 ENCOUNTER — Ambulatory Visit: Payer: Self-pay | Attending: Family Medicine | Admitting: Family Medicine

## 2015-04-19 VITALS — BP 145/90 | HR 88 | Temp 98.7°F | Resp 16 | Ht 63.0 in | Wt 161.0 lb

## 2015-04-19 DIAGNOSIS — F419 Anxiety disorder, unspecified: Secondary | ICD-10-CM | POA: Insufficient documentation

## 2015-04-19 DIAGNOSIS — Z0000 Encounter for general adult medical examination without abnormal findings: Secondary | ICD-10-CM

## 2015-04-19 DIAGNOSIS — F418 Other specified anxiety disorders: Secondary | ICD-10-CM

## 2015-04-19 DIAGNOSIS — I10 Essential (primary) hypertension: Secondary | ICD-10-CM | POA: Insufficient documentation

## 2015-04-19 DIAGNOSIS — F329 Major depressive disorder, single episode, unspecified: Secondary | ICD-10-CM | POA: Insufficient documentation

## 2015-04-19 DIAGNOSIS — Z87891 Personal history of nicotine dependence: Secondary | ICD-10-CM | POA: Insufficient documentation

## 2015-04-19 DIAGNOSIS — M19041 Primary osteoarthritis, right hand: Secondary | ICD-10-CM

## 2015-04-19 DIAGNOSIS — F32A Depression, unspecified: Secondary | ICD-10-CM

## 2015-04-19 DIAGNOSIS — Z79899 Other long term (current) drug therapy: Secondary | ICD-10-CM | POA: Insufficient documentation

## 2015-04-19 DIAGNOSIS — Z1231 Encounter for screening mammogram for malignant neoplasm of breast: Secondary | ICD-10-CM

## 2015-04-19 DIAGNOSIS — M19042 Primary osteoarthritis, left hand: Secondary | ICD-10-CM | POA: Insufficient documentation

## 2015-04-19 LAB — POCT GLYCOSYLATED HEMOGLOBIN (HGB A1C): HEMOGLOBIN A1C: 5.4

## 2015-04-19 MED ORDER — LISINOPRIL 40 MG PO TABS
40.0000 mg | ORAL_TABLET | Freq: Every day | ORAL | Status: DC
Start: 2015-04-19 — End: 2015-09-09

## 2015-04-19 MED ORDER — NAPROXEN 500 MG PO TABS: 500.0000 mg | ORAL_TABLET | Freq: Two times a day (BID) | ORAL | Status: DC

## 2015-04-19 MED ORDER — LORAZEPAM 0.5 MG PO TABS: 0.5000 mg | ORAL_TABLET | Freq: Three times a day (TID) | ORAL | Status: DC

## 2015-04-19 MED ORDER — AMLODIPINE BESYLATE 10 MG PO TABS: 10.0000 mg | ORAL_TABLET | Freq: Every day | ORAL | Status: DC

## 2015-04-19 MED ORDER — ACETAMINOPHEN-CODEINE #3 300-30 MG PO TABS: 1.0000 | ORAL_TABLET | Freq: Three times a day (TID) | ORAL | Status: DC | PRN

## 2015-04-19 MED FILL — AMLODIPINE BESYLATE 10 MG T: 10 | 30 days supply | Qty: 30 | Fill #0

## 2015-04-19 MED FILL — LISINOPRIL 40 MG TABLET: 40 | 30 days supply | Qty: 30 | Fill #0

## 2015-04-19 MED FILL — ACETAMINOPHEN/COD #3 TABLET: 300-30 | 10 days supply | Qty: 30 | Fill #0

## 2015-04-19 MED FILL — LORazepam 0.5 MG TABS: 0.5 | 7 days supply | Qty: 20 | Fill #0

## 2015-04-19 MED FILL — NAPROXEN 500 MG TABLET: 500 | 15 days supply | Qty: 30 | Fill #0

## 2015-04-19 NOTE — Progress Notes (Signed)
Subjective:  Patient ID: [name], female    DOB: April 03, 1950  Age: 65 y.o. MRN: [id number]  CC: Hypertension   HPI GIOVANA FEHLMAN presents for    1. CHRONIC HYPERTENSION  Disease Monitoring  Blood pressure range: 130-160/80s at home   Chest pain: no   Dyspnea: no   Claudication: no   Medication compliance: yes  Medication Side Effects  Lightheadedness: no   Urinary frequency: no   Edema: no     Salt Restriction: yes   2. Hand pain: pain and numbness in both hands. She has known osteoarthritis in both hands. She has intermittent swelling.  she takes naproxen and tylenol #3 sparingly. She wears braces.  3. Anxiety and depression: she has chronic anxiety and depression. She takes lexapro, trazodone prn. She goes to counseling. She has been crying more lately in anticipation of the one year anniversary of her son's death. She is planning to go back to [place] for Easter.   Social History  Substance Use Topics  . Smoking status: Former Smoker    Quit date: 03/20/1994  . Smokeless tobacco: Never Used  . Alcohol Use: No    Outpatient Prescriptions Prior to Visit  Medication Sig Dispense Refill  . acetaminophen (TYLENOL 8 HOUR) 650 MG CR tablet Take 1 tablet (650 mg total) by mouth every 8 (eight) hours as needed for pain. 60 tablet 2  . acetaminophen-codeine (TYLENOL #3) 300-30 MG tablet Take 1 tablet by mouth every 8 (eight) hours as needed for moderate pain. 30 tablet 0  . amLODipine (NORVASC) 10 MG tablet Take 1 tablet (10 mg total) by mouth daily. 30 tablet 5  . cetirizine (ZYRTEC) 10 MG tablet Take 1 tablet (10 mg total) by mouth daily. 30 tablet 3  . Elastic Bandages & Supports (WRIST SPLINT/ELASTIC LEFT MED) MISC 1 each by Does not apply route daily. 1 each 0  . Elastic Bandages & Supports (WRIST SPLINT/ELASTIC RIGHT MED) MISC 1 each by Does not apply route daily. 1 each 0  . escitalopram (LEXAPRO) 20 MG tablet Take 1 tablet (20 mg total) by mouth daily. 30  tablet 5  . lisinopril (PRINIVIL,ZESTRIL) 40 MG tablet TAKE 1 TABLET BY MOUTH DAILY. 30 tablet 1  . lisinopril (PRINIVIL,ZESTRIL) 40 MG tablet Take 1 tablet (40 mg total) by mouth daily. 30 tablet 5  . naproxen (NAPROSYN) 500 MG tablet Take 1 tablet (500 mg total) by mouth 2 (two) times daily with a meal. For 3-5 days, then take a break for 1 week at least 30 tablet 0  . traZODone (DESYREL) 50 MG tablet Take 0.5-1 tablets (25-50 mg total) by mouth at bedtime. 30 tablet 5   No facility-administered medications prior to visit.    ROS Review of Systems  Constitutional: Negative for fever and chills.  Eyes: Negative for visual disturbance.  Respiratory: Negative for shortness of breath.   Cardiovascular: Negative for chest pain.  Gastrointestinal: Negative for abdominal pain and blood in stool.  Musculoskeletal: Positive for arthralgias. Negative for back pain.  Skin: Negative for rash.  Allergic/Immunologic: Negative for immunocompromised state.  Neurological: Positive for numbness.  Hematological: Negative for adenopathy. Does not bruise/bleed easily.  Psychiatric/Behavioral: Positive for dysphoric mood. Negative for suicidal ideas. The patient is nervous/anxious.     Objective:  BP 145/90 mmHg  Pulse 88  Temp(Src) 98.7 F (37.1 C) (Oral)  Resp 16  Ht 5\' 3"  (1.6 m)  Wt 161 lb (73.029 kg)  BMI 28.53 kg/m2  SpO2 100%  BP/Weight 04/19/2015 02/05/2015 99991111  Systolic BP [id number] A999333 [id number]  Diastolic BP 74 75 72  Wt. (Lbs) 161 163 159  BMI 28.53 28.88 28.17   Physical Exam  Constitutional: She is oriented to person, place, and time. She appears well-developed and well-nourished. No distress.  HENT:  Head: Normocephalic and atraumatic.  Cardiovascular: Normal rate, regular rhythm, normal heart sounds and intact distal pulses.   Pulmonary/Chest: Effort normal and breath sounds normal.  Musculoskeletal: She exhibits no edema.       Right wrist: She exhibits decreased range of motion,  tenderness and swelling. She exhibits no bony tenderness, no crepitus, no deformity and no laceration.       Left wrist: She exhibits decreased range of motion, tenderness and swelling. She exhibits no bony tenderness, no effusion, no crepitus, no deformity and no laceration.  Neurological: She is alert and oriented to person, place, and time.  Skin: Skin is warm and dry. No rash noted.  Psychiatric: She has a normal mood and affect.   Lab Results  Component Value Date   HGBA1C 5.40 04/19/2015     Assessment & Plan:   Jabreia was seen today for hypertension.  Diagnoses and all orders for this visit:  Healthcare maintenance -     POCT glycosylated hemoglobin (Hb A1C)  Anxiety and depression -     LORazepam (ATIVAN) 0.5 MG tablet; Take 1 tablet (0.5 mg total) by mouth every 8 (eight) hours.  Primary osteoarthritis of right hand -     acetaminophen-codeine (TYLENOL #3) 300-30 MG tablet; Take 1 tablet by mouth every 8 (eight) hours as needed for moderate pain. -     naproxen (NAPROSYN) 500 MG tablet; Take 1 tablet (500 mg total) by mouth 2 (two) times daily with a meal. For 3-5 days, then take a break for 1 week at least  Primary osteoarthritis of left hand -     acetaminophen-codeine (TYLENOL #3) 300-30 MG tablet; Take 1 tablet by mouth every 8 (eight) hours as needed for moderate pain. -     naproxen (NAPROSYN) 500 MG tablet; Take 1 tablet (500 mg total) by mouth 2 (two) times daily with a meal. For 3-5 days, then take a break for 1 week at least  Essential hypertension -     amLODipine (NORVASC) 10 MG tablet; Take 1 tablet (10 mg total) by mouth daily. -     lisinopril (PRINIVIL,ZESTRIL) 40 MG tablet; Take 1 tablet (40 mg total) by mouth daily.   No orders of the defined types were placed in this encounter.    Follow-up: No Follow-up on file.   Boykin Nearing MD

## 2015-04-19 NOTE — Assessment & Plan Note (Signed)
A; persistent hand OA P: Refilled tylenol #3 and naproxen

## 2015-04-19 NOTE — Telephone Encounter (Signed)
Pt need a refill for her bp  Medicine last seen on 12/16 . Pt use [facility]  Thank you

## 2015-04-19 NOTE — Progress Notes (Signed)
F/U HTN Hand pain and numbness bilateral  No pain today  No suicidal thought in the past two weeks  No tobacco user

## 2015-04-19 NOTE — Assessment & Plan Note (Signed)
A: persistent with slight worsening in setting of nearing one year anniversary of her son's death P: Add ativan

## 2015-04-19 NOTE — Assessment & Plan Note (Signed)
A; elevated BP Med: compliant P: Add ativan for anxiety Continue norvasc 10 and lisinopril 40 Add HCTZ of BP elevated at recheck

## 2015-04-19 NOTE — Patient Instructions (Addendum)
[name] was seen today for hypertension.  Diagnoses and all orders for this visit:  Healthcare maintenance -     POCT glycosylated hemoglobin (Hb A1C)  Anxiety and depression -     LORazepam (ATIVAN) 0.5 MG tablet; Take 1 tablet (0.5 mg total) by mouth every 8 (eight) hours.  Primary osteoarthritis of right hand -     acetaminophen-codeine (TYLENOL #3) 300-30 MG tablet; Take 1 tablet by mouth every 8 (eight) hours as needed for moderate pain. -     naproxen (NAPROSYN) 500 MG tablet; Take 1 tablet (500 mg total) by mouth 2 (two) times daily with a meal. For 3-5 days, then take a break for 1 week at least  Primary osteoarthritis of left hand -     acetaminophen-codeine (TYLENOL #3) 300-30 MG tablet; Take 1 tablet by mouth every 8 (eight) hours as needed for moderate pain. -     naproxen (NAPROSYN) 500 MG tablet; Take 1 tablet (500 mg total) by mouth 2 (two) times daily with a meal. For 3-5 days, then take a break for 1 week at least  Essential hypertension -     amLODipine (NORVASC) 10 MG tablet; Take 1 tablet (10 mg total) by mouth daily. -     lisinopril (PRINIVIL,ZESTRIL) 40 MG tablet; Take 1 tablet (40 mg total) by mouth daily.  Encounter for screening mammogram for breast cancer -     MM DIGITAL SCREENING BILATERAL; Future    F/u in 3 weeks with pharmacist for BP check F/u with me in 6 weeks for HTN   Dr. Adrian Blackwater

## 2015-04-19 NOTE — Progress Notes (Signed)
Depression screen [facility] 2/9 04/19/2015 11/14/2014 09/14/2014 04/19/2014 03/20/2014  Decreased Interest 3 0 3 2 3   Down, Depressed, Hopeless 3 0 3 3 3   PHQ - 2 Score 6 0 6 5 6   Altered sleeping 3 - 3 1 3   Tired, decreased energy 1 - 3 1 0  Change in appetite 2 - 2 1 2   Feeling bad or failure about yourself  - - 3 2 1   Trouble concentrating 0 - 3 3 3   Moving slowly or fidgety/restless 0 - 0 3 2  Suicidal thoughts - - 2 3 2   PHQ-9 Score 12 - 22 19 19   Difficult doing work/chores - - Not difficult at all - -   GAD 7 : Generalized Anxiety Score 04/19/2015  Nervous, Anxious, on Edge 1  Control/stop worrying 2  Worry too much - different things 2  Trouble relaxing 2  Restless 2  Easily annoyed or irritable 2  Afraid - awful might happen 2  Total GAD 7 Score 13

## 2015-04-24 NOTE — Telephone Encounter (Signed)
Rx refilled send to [facility]

## 2015-05-29 MED FILL — traZODone HCL 50 MG TABS: 50 | 30 days supply | Qty: 30 | Fill #4

## 2015-05-29 MED FILL — AMLODIPINE BESYLATE 10 MG T: 10 | 30 days supply | Qty: 30 | Fill #1

## 2015-05-29 MED FILL — LISINOPRIL 40 MG TABLET: 40 | 30 days supply | Qty: 30 | Fill #1

## 2015-06-28 MED FILL — AMLODIPINE BESYLATE 10 MG T: 10 | 30 days supply | Qty: 30 | Fill #2

## 2015-06-28 MED FILL — traZODone HCL 50 MG TABS: 50 | 30 days supply | Qty: 30 | Fill #5

## 2015-06-28 MED FILL — LISINOPRIL 40 MG TABLET: 40 | 30 days supply | Qty: 30 | Fill #2

## 2015-07-30 MED FILL — LISINOPRIL 40 MG TABLET: 40 | 30 days supply | Qty: 30 | Fill #3

## 2015-07-30 MED FILL — AMLODIPINE BESYLATE 10 MG T: 10 | 30 days supply | Qty: 30 | Fill #3

## 2015-08-26 MED FILL — AMLODIPINE BESYLATE 10 MG T: 10 | 30 days supply | Qty: 30 | Fill #4

## 2015-08-26 MED FILL — LISINOPRIL 40 MG TABLET: 40 | 30 days supply | Qty: 30 | Fill #4

## 2015-09-06 ENCOUNTER — Ambulatory Visit: Payer: Self-pay | Admitting: Family Medicine

## 2015-09-09 ENCOUNTER — Ambulatory Visit: Payer: Self-pay | Attending: Family Medicine | Admitting: Family Medicine

## 2015-09-09 ENCOUNTER — Encounter: Payer: Self-pay | Admitting: Family Medicine

## 2015-09-09 VITALS — BP 138/82 | HR 86 | Temp 98.5°F | Resp 18 | Ht 63.0 in | Wt 157.8 lb

## 2015-09-09 DIAGNOSIS — Z79899 Other long term (current) drug therapy: Secondary | ICD-10-CM | POA: Insufficient documentation

## 2015-09-09 DIAGNOSIS — F329 Major depressive disorder, single episode, unspecified: Secondary | ICD-10-CM | POA: Insufficient documentation

## 2015-09-09 DIAGNOSIS — J302 Other seasonal allergic rhinitis: Secondary | ICD-10-CM | POA: Insufficient documentation

## 2015-09-09 DIAGNOSIS — B182 Chronic viral hepatitis C: Secondary | ICD-10-CM | POA: Insufficient documentation

## 2015-09-09 DIAGNOSIS — I10 Essential (primary) hypertension: Secondary | ICD-10-CM | POA: Insufficient documentation

## 2015-09-09 DIAGNOSIS — Z87891 Personal history of nicotine dependence: Secondary | ICD-10-CM | POA: Insufficient documentation

## 2015-09-09 DIAGNOSIS — F418 Other specified anxiety disorders: Secondary | ICD-10-CM

## 2015-09-09 DIAGNOSIS — M19042 Primary osteoarthritis, left hand: Secondary | ICD-10-CM | POA: Insufficient documentation

## 2015-09-09 DIAGNOSIS — M19041 Primary osteoarthritis, right hand: Secondary | ICD-10-CM | POA: Insufficient documentation

## 2015-09-09 DIAGNOSIS — F419 Anxiety disorder, unspecified: Secondary | ICD-10-CM | POA: Insufficient documentation

## 2015-09-09 DIAGNOSIS — R928 Other abnormal and inconclusive findings on diagnostic imaging of breast: Secondary | ICD-10-CM | POA: Insufficient documentation

## 2015-09-09 DIAGNOSIS — F32A Depression, unspecified: Secondary | ICD-10-CM

## 2015-09-09 MED ORDER — ESCITALOPRAM OXALATE 20 MG PO TABS: 20.0000 mg | ORAL_TABLET | Freq: Every day | ORAL | Status: DC

## 2015-09-09 MED ORDER — LISINOPRIL 40 MG PO TABS
40.0000 mg | ORAL_TABLET | Freq: Every day | ORAL | Status: DC
Start: 2015-09-09 — End: 2016-01-31

## 2015-09-09 MED ORDER — TRAZODONE HCL 50 MG PO TABS: 25.0000 mg | ORAL_TABLET | Freq: Every day | ORAL | Status: DC

## 2015-09-09 MED ORDER — AMLODIPINE BESYLATE 10 MG PO TABS: 10.0000 mg | ORAL_TABLET | Freq: Every day | ORAL | Status: DC

## 2015-09-09 MED ORDER — CETIRIZINE HCL 10 MG PO TABS
10.0000 mg | ORAL_TABLET | Freq: Every day | ORAL | Status: DC
Start: 2015-09-09 — End: 2017-06-01

## 2015-09-09 MED ORDER — LORAZEPAM 0.5 MG PO TABS: 0.5000 mg | ORAL_TABLET | Freq: Three times a day (TID) | ORAL | Status: DC | PRN

## 2015-09-09 MED FILL — traZODone HCL 50 MG TABS: 50 | 30 days supply | Qty: 30 | Fill #0

## 2015-09-09 MED FILL — ?ESCITALOPRAM 20 MG TABLET: 20 | 30 days supply | Qty: 30 | Fill #0

## 2015-09-09 MED FILL — ?CETIRIZINE HCL 10 MG TABLE: 10 | 30 days supply | Qty: 30 | Fill #0

## 2015-09-09 MED FILL — LORazepam 0.5 MG TABS: 0.5 | 10 days supply | Qty: 30 | Fill #0

## 2015-09-09 NOTE — Progress Notes (Signed)
Subjective:  Patient ID: [name], female    DOB: 1950-03-24  Age: 65 y.o. MRN: [id number]  CC: Hypertension   HPI [name] has  HTN, anxiety, depression and wrist OA she  presents for    1. CHRONIC HYPERTENSION  Disease Monitoring  Blood pressure range: not checking   Chest pain: no   Dyspnea: no   Claudication: no   Medication compliance: yes  Medication Side Effects  Lightheadedness: no   Urinary frequency: no   Edema: no   2. Anxiety and depression: overall improved but she is dealing with a close friend who has terminal cancer and is on hospice. She has some anxiety when she is with her friend. She takes lexapro daily. She takes trazodone some nights. She does not have ativan currently but would like some prn anxiety.   3. Wrist pain: resolved. She is taking tumeric which has reduced her pain and swelling. She has improved ROM. She is not requiring tylenol #3.    4. Abnormal screening mammogram: in 2013 there was a possible R breast mass noted. She denies R breast lump. She has not had the ordered f/u diagnostic mammogram or ultrasound. She is amenable to both.   5. Hep C +: hep C positive. Patient due for Hep C quant. No peripheral edema or GI upset.   Social History  Substance Use Topics  . Smoking status: Former Smoker    Quit date: 03/20/1994  . Smokeless tobacco: Never Used  . Alcohol Use: No    Outpatient Prescriptions Prior to Visit  Medication Sig Dispense Refill  . acetaminophen (TYLENOL 8 HOUR) 650 MG CR tablet Take 1 tablet (650 mg total) by mouth every 8 (eight) hours as needed for pain. 60 tablet 2  . acetaminophen-codeine (TYLENOL #3) 300-30 MG tablet Take 1 tablet by mouth every 8 (eight) hours as needed for moderate pain. 30 tablet 0  . amLODipine (NORVASC) 10 MG tablet Take 1 tablet (10 mg total) by mouth daily. 30 tablet 5  . cetirizine (ZYRTEC) 10 MG tablet Take 1 tablet (10 mg total) by mouth daily. 30 tablet 3  . Elastic Bandages &  Supports (WRIST SPLINT/ELASTIC LEFT MED) MISC 1 each by Does not apply route daily. 1 each 0  . Elastic Bandages & Supports (WRIST SPLINT/ELASTIC RIGHT MED) MISC 1 each by Does not apply route daily. 1 each 0  . escitalopram (LEXAPRO) 20 MG tablet Take 1 tablet (20 mg total) by mouth daily. 30 tablet 5  . lisinopril (PRINIVIL,ZESTRIL) 40 MG tablet Take 1 tablet (40 mg total) by mouth daily. 30 tablet 5  . LORazepam (ATIVAN) 0.5 MG tablet Take 1 tablet (0.5 mg total) by mouth every 8 (eight) hours. 20 tablet 0  . naproxen (NAPROSYN) 500 MG tablet Take 1 tablet (500 mg total) by mouth 2 (two) times daily with a meal. For 3-5 days, then take a break for 1 week at least 30 tablet 0  . traZODone (DESYREL) 50 MG tablet Take 0.5-1 tablets (25-50 mg total) by mouth at bedtime. 30 tablet 5   No facility-administered medications prior to visit.    ROS Review of Systems  Constitutional: Negative for fever and chills.  Eyes: Negative for visual disturbance.  Respiratory: Negative for shortness of breath.   Cardiovascular: Negative for chest pain.  Gastrointestinal: Negative for abdominal pain and blood in stool.  Musculoskeletal: Negative for back pain and arthralgias.  Skin: Negative for rash.  Allergic/Immunologic: Negative for immunocompromised state.  Neurological: Negative for numbness.  Hematological: Negative for adenopathy. Does not bruise/bleed easily.  Psychiatric/Behavioral: Negative for suicidal ideas and dysphoric mood. The patient is nervous/anxious.     Objective:  BP 138/82 mmHg  Pulse 86  Temp(Src) 98.5 F (36.9 C) (Oral)  Resp 18  Ht 5\' 3"  (1.6 m)  Wt 157 lb 12.8 oz (71.578 kg)  BMI 27.96 kg/m2  SpO2 100%  BP/Weight 09/09/2015 04/19/2015 99991111  Systolic BP 0000000 [id number]  Diastolic BP 82 90 75  Wt. (Lbs) 157.8 161 163  BMI 27.96 28.53 28.88    Physical Exam  Constitutional: She is oriented to person, place, and time. She appears well-developed and well-nourished. No  distress.  HENT:  Head: Normocephalic and atraumatic.  Neck: No thyromegaly present.  Cardiovascular: Normal rate, regular rhythm, normal heart sounds and intact distal pulses.   Pulmonary/Chest: Effort normal and breath sounds normal.  Musculoskeletal: She exhibits no edema.       Right wrist: She exhibits normal range of motion, no tenderness, no bony tenderness, no swelling, no crepitus, no deformity and no laceration.       Left wrist: She exhibits normal range of motion, no tenderness, no bony tenderness, no swelling, no effusion, no crepitus, no deformity and no laceration.  Neurological: She is alert and oriented to person, place, and time.  Skin: Skin is warm and dry. No rash noted.  Psychiatric: She has a normal mood and affect.     Assessment & Plan:   There are no diagnoses linked to this encounter. [name] was seen today for hypertension.  Diagnoses and all orders for this visit:  Essential hypertension -     lisinopril (PRINIVIL,ZESTRIL) 40 MG tablet; Take 1 tablet (40 mg total) by mouth daily. -     amLODipine (NORVASC) 10 MG tablet; Take 1 tablet (10 mg total) by mouth daily.  Anxiety and depression -     escitalopram (LEXAPRO) 20 MG tablet; Take 1 tablet (20 mg total) by mouth daily. -     traZODone (DESYREL) 50 MG tablet; Take 0.5-1 tablets (25-50 mg total) by mouth at bedtime. -     LORazepam (ATIVAN) 0.5 MG tablet; Take 1 tablet (0.5 mg total) by mouth every 8 (eight) hours as needed for anxiety or sleep.  Other seasonal allergic rhinitis -     cetirizine (ZYRTEC) 10 MG tablet; Take 1 tablet (10 mg total) by mouth daily.  Abnormal mammogram of right breast -     MM Digital Diagnostic Bilat; Future -     US BREAST LTD UNI RIGHT INC AXILLA; Future  Chronic hepatitis C without hepatic coma (HCC) -     Hepatitis C RNA quantitative   Meds ordered this encounter  Medications  . lisinopril (PRINIVIL,ZESTRIL) 40 MG tablet    Sig: Take 1 tablet (40 mg total) by  mouth daily.    Dispense:  30 tablet    Refill:  5  . escitalopram (LEXAPRO) 20 MG tablet    Sig: Take 1 tablet (20 mg total) by mouth daily.    Dispense:  30 tablet    Refill:  5  . traZODone (DESYREL) 50 MG tablet    Sig: Take 0.5-1 tablets (25-50 mg total) by mouth at bedtime.    Dispense:  30 tablet    Refill:  5  . cetirizine (ZYRTEC) 10 MG tablet    Sig: Take 1 tablet (10 mg total) by mouth daily.    Dispense:  30 tablet  Refill:  3  . amLODipine (NORVASC) 10 MG tablet    Sig: Take 1 tablet (10 mg total) by mouth daily.    Dispense:  30 tablet    Refill:  5  . LORazepam (ATIVAN) 0.5 MG tablet    Sig: Take 1 tablet (0.5 mg total) by mouth every 8 (eight) hours as needed for anxiety or sleep.    Dispense:  30 tablet    Refill:  0    Follow-up: No Follow-up on file.   Boykin Nearing MD

## 2015-09-09 NOTE — Patient Instructions (Addendum)
[name] was seen today for hypertension.  Diagnoses and all orders for this visit:  Essential hypertension -     lisinopril (PRINIVIL,ZESTRIL) 40 MG tablet; Take 1 tablet (40 mg total) by mouth daily. -     amLODipine (NORVASC) 10 MG tablet; Take 1 tablet (10 mg total) by mouth daily.  Anxiety and depression -     escitalopram (LEXAPRO) 20 MG tablet; Take 1 tablet (20 mg total) by mouth daily. -     traZODone (DESYREL) 50 MG tablet; Take 0.5-1 tablets (25-50 mg total) by mouth at bedtime. -     LORazepam (ATIVAN) 0.5 MG tablet; Take 1 tablet (0.5 mg total) by mouth every 8 (eight) hours as needed for anxiety or sleep.  Other seasonal allergic rhinitis -     cetirizine (ZYRTEC) 10 MG tablet; Take 1 tablet (10 mg total) by mouth daily.  Abnormal mammogram of right breast -     MM Digital Diagnostic Bilat; Future -     US BREAST LTD UNI RIGHT INC AXILLA; Future    F/u in 3 months for HTN   Dr. Adrian Blackwater

## 2015-09-09 NOTE — Assessment & Plan Note (Signed)
Hep C quant ordered

## 2015-09-09 NOTE — Assessment & Plan Note (Signed)
Improved with tumeric supplement Continue current regimen

## 2015-09-09 NOTE — Assessment & Plan Note (Signed)
Abnormal screening mammogram in 2013 with R breast mass Plan for diagnostic mammogram and R breast US

## 2015-09-09 NOTE — Progress Notes (Signed)
Patient is here for HTN FU  Patient denies pain at this time.  Patient has taken medication and patient has eaten today.  Patient is concerned about a friend who is on Hospice.

## 2015-09-09 NOTE — Assessment & Plan Note (Signed)
Anxiety and depression Improved with recent added stress of close friend on hospice  Continue lexapro 20 mg daily Continue trazodone 25-50 mg qHS prn Refilled ativan 0.5 mg q 8 prn anxiety

## 2015-09-09 NOTE — Assessment & Plan Note (Signed)
Improved on manual recheck BP at goal  Plan  Continue norvasc 10 mg daily Continue lisinopril 40 mg daily

## 2015-09-10 LAB — HEPATITIS C RNA QUANTITATIVE
HCV QUANT LOG: 7.46 {Log} — AB (ref <1.18)
HCV QUANT: 28604814 [IU]/mL — AB (ref <15)

## 2015-09-11 ENCOUNTER — Other Ambulatory Visit: Payer: Self-pay

## 2015-09-17 ENCOUNTER — Other Ambulatory Visit: Payer: Self-pay | Admitting: Family Medicine

## 2015-09-17 DIAGNOSIS — B182 Chronic viral hepatitis C: Secondary | ICD-10-CM

## 2015-09-25 ENCOUNTER — Telehealth: Payer: Self-pay | Admitting: Family Medicine

## 2015-09-25 ENCOUNTER — Telehealth: Payer: Self-pay

## 2015-09-25 NOTE — Telephone Encounter (Signed)
Pt's called returning nurse's call to review results please f/up

## 2015-09-25 NOTE — Telephone Encounter (Signed)
Returned pts call and pt is aware of lab results and the ID referral. Pt doesn't have any questions or concerns

## 2015-09-25 NOTE — Telephone Encounter (Signed)
Went over pt lab results.  

## 2015-09-25 NOTE — Telephone Encounter (Signed)
Contacted pt to go over lab results pt did not answer lvm for pt to give me a call at her earliest convenience

## 2015-10-01 MED FILL — ?AMLODIPINE BESYLATE 10 MG: 10 | 30 days supply | Qty: 30 | Fill #5

## 2015-10-01 MED FILL — ?LISINOPRIL 40 MG TABLET: 40 MG | 30 days supply | Qty: 30 | Fill #5

## 2015-10-21 ENCOUNTER — Encounter: Payer: Self-pay | Admitting: Family Medicine

## 2015-10-21 ENCOUNTER — Ambulatory Visit: Payer: Self-pay | Attending: Family Medicine | Admitting: Family Medicine

## 2015-10-21 VITALS — BP 146/90 | HR 83 | Temp 98.4°F | Ht 63.0 in | Wt 160.4 lb

## 2015-10-21 DIAGNOSIS — F419 Anxiety disorder, unspecified: Secondary | ICD-10-CM | POA: Insufficient documentation

## 2015-10-21 DIAGNOSIS — Z0000 Encounter for general adult medical examination without abnormal findings: Secondary | ICD-10-CM

## 2015-10-21 DIAGNOSIS — F329 Major depressive disorder, single episode, unspecified: Secondary | ICD-10-CM | POA: Insufficient documentation

## 2015-10-21 DIAGNOSIS — F418 Other specified anxiety disorders: Secondary | ICD-10-CM

## 2015-10-21 DIAGNOSIS — I10 Essential (primary) hypertension: Secondary | ICD-10-CM | POA: Insufficient documentation

## 2015-10-21 DIAGNOSIS — Z87891 Personal history of nicotine dependence: Secondary | ICD-10-CM | POA: Insufficient documentation

## 2015-10-21 DIAGNOSIS — F32A Depression, unspecified: Secondary | ICD-10-CM

## 2015-10-21 LAB — BASIC METABOLIC PANEL WITH GFR
BUN: 10 mg/dL (ref 7–25)
CALCIUM: 9.5 mg/dL (ref 8.6–10.4)
CO2: 26 mmol/L (ref 20–31)
CREATININE: 0.9 mg/dL (ref 0.50–0.99)
Chloride: 102 mmol/L (ref 98–110)
GFR, EST AFRICAN AMERICAN: 78 mL/min (ref >=60)
GFR, Est Non African American: 68 mL/min (ref >=60)
GLUCOSE: 87 mg/dL (ref 65–99)
POTASSIUM: 4.3 mmol/L (ref 3.5–5.3)
Sodium: 137 mmol/L (ref 135–146)

## 2015-10-21 MED ORDER — PROPRANOLOL HCL ER 80 MG PO CP24: 80.0000 mg | ORAL_CAPSULE | Freq: Every day | ORAL | 2 refills | Status: DC

## 2015-10-21 MED ORDER — TRAZODONE HCL 50 MG PO TABS: 50.0000 mg | ORAL_TABLET | Freq: Every day | ORAL | 5 refills | Status: DC

## 2015-10-21 MED FILL — traZODone HCL 50 MG TABS: 50 | 30 days supply | Qty: 30 | Fill #0

## 2015-10-21 MED FILL — PROPRANOLOL ER 80 MG CAP: 80 | 30 days supply | Qty: 30 | Fill #0

## 2015-10-21 NOTE — Patient Instructions (Addendum)
Diagnoses and all orders for this visit:  Healthcare maintenance -     Flu Vaccine QUAD 36+ mos IM  Anxiety and depression -     traZODone (DESYREL) 50 MG tablet; Take 1 tablet (50 mg total) by mouth at bedtime. -     propranolol ER (INDERAL LA) 80 MG 24 hr capsule; Take 1 capsule (80 mg total) by mouth at bedtime.  Essential hypertension -     BASIC METABOLIC PANEL WITH GFR -     propranolol ER (INDERAL LA) 80 MG 24 hr capsule; Take 1 capsule (80 mg total) by mouth at bedtime.   Continue norvasc 10 mg daily continue lisinopril 40 mg daily  F/u in 4 weeks for HTN  Dr. Adrian Blackwater

## 2015-10-21 NOTE — Assessment & Plan Note (Signed)
Anxiety and depression with ongoing grieving Continue lexapro Continue trazodone Ativan is not a longterm med Patient advised to seek mental healthcare at the following locations Counseling services available at [facility] of [place], [place] and [place].

## 2015-10-21 NOTE — Assessment & Plan Note (Addendum)
Declined with elevated BP Patient is tearful during exam   Add propranolol 80 mg nightly for HTN, HA, anxiety Continue norvasc and lisinopril

## 2015-10-21 NOTE — Progress Notes (Signed)
C/C: patient is having trouble sleeping due to the passing of her son and friend.

## 2015-10-21 NOTE — Progress Notes (Signed)
Subjective:  Patient ID: [name], female    DOB: [birth year]  Age: 65 y.o. MRN: [id number]  CC: No chief complaint on file.   HPI [name] presents for   1. Grieving: grieving recently loss a close friend who passed away one month ago. She also lost her sister and her son last year. She is not receiving counseling services. She is taking lexapro 20 mg daily  and trazodone 25 mg  at night. She feels depressed and sad. She is staying in most days. She was summoned for jury duty but reports that she cannot perform the duty.  2. HTN: taking norvasc and lisinopril. Having HA. No CP, SOB or leg swelling.   Social History  Substance Use Topics  . Smoking status: Former Smoker    Quit date: 03/20/1994  . Smokeless tobacco: Never Used  . Alcohol use No   Outpatient Medications Prior to Visit  Medication Sig Dispense Refill  . acetaminophen (TYLENOL 8 HOUR) 650 MG CR tablet Take 1 tablet (650 mg total) by mouth every 8 (eight) hours as needed for pain. 60 tablet 2  . amLODipine (NORVASC) 10 MG tablet Take 1 tablet (10 mg total) by mouth daily. 30 tablet 5  . cetirizine (ZYRTEC) 10 MG tablet Take 1 tablet (10 mg total) by mouth daily. 30 tablet 3  . Elastic Bandages & Supports (WRIST SPLINT/ELASTIC LEFT MED) MISC 1 each by Does not apply route daily. 1 each 0  . Elastic Bandages & Supports (WRIST SPLINT/ELASTIC RIGHT MED) MISC 1 each by Does not apply route daily. 1 each 0  . escitalopram (LEXAPRO) 20 MG tablet Take 1 tablet (20 mg total) by mouth daily. 30 tablet 5  . lisinopril (PRINIVIL,ZESTRIL) 40 MG tablet Take 1 tablet (40 mg total) by mouth daily. 30 tablet 5  . LORazepam (ATIVAN) 0.5 MG tablet Take 1 tablet (0.5 mg total) by mouth every 8 (eight) hours as needed for anxiety or sleep. 30 tablet 0  . traZODone (DESYREL) 50 MG tablet Take 0.5-1 tablets (25-50 mg total) by mouth at bedtime. 30 tablet 5   No facility-administered medications prior to visit.     ROS Review of  Systems  Constitutional: Negative for chills and fever.  Eyes: Negative for visual disturbance.  Respiratory: Negative for shortness of breath.   Cardiovascular: Negative for chest pain.  Gastrointestinal: Negative for abdominal pain and blood in stool.  Musculoskeletal: Negative for arthralgias and back pain.  Skin: Negative for rash.  Allergic/Immunologic: Negative for immunocompromised state.  Neurological: Positive for headaches.  Hematological: Negative for adenopathy. Does not bruise/bleed easily.  Psychiatric/Behavioral: Positive for dysphoric mood, sleep disturbance and suicidal ideas (thoughts she would be better off dead but no plan ). The patient is nervous/anxious.     Objective:  BP (!) 185/74 (BP Location: Right Arm, Patient Position: Sitting, Cuff Size: Small)   Pulse 83   Temp 98.4 F (36.9 C) (Oral)   Ht 5\' 3"  (1.6 m)   Wt 160 lb 6.4 oz (72.8 kg)   SpO2 100%   BMI 28.41 kg/m   BP/Weight 10/21/2015 09/09/2015 99991111  Systolic BP 123XX123 0000000 Q000111Q  Diastolic BP 74 82 90  Wt. (Lbs) 160.4 157.8 161  BMI 28.41 27.96 28.53   Physical Exam  Constitutional: She is oriented to person, place, and time. She appears well-developed and well-nourished. No distress.  HENT:  Head: Normocephalic and atraumatic.  Cardiovascular: Normal rate, regular rhythm, normal heart sounds and intact distal  pulses.   Pulmonary/Chest: Effort normal and breath sounds normal.  Musculoskeletal: She exhibits no edema.  Neurological: She is alert and oriented to person, place, and time.  Skin: Skin is warm and dry. No rash noted.  Psychiatric: She exhibits a depressed mood.  Tearful during the exam    Depression screen [facility] 2/9 10/21/2015 09/09/2015 04/19/2015  Decreased Interest 1 0 3  Down, Depressed, Hopeless 1 0 3  PHQ - 2 Score 2 0 6  Altered sleeping - - 3  Tired, decreased energy - - 1  Change in appetite - - 2  Feeling bad or failure about yourself  - - -  Trouble concentrating - - 0    Moving slowly or fidgety/restless - - 0  Suicidal thoughts - - -  PHQ-9 Score - - 12  Difficult doing work/chores - - -   GAD 7 : Generalized Anxiety Score 09/09/2015 04/19/2015  Nervous, Anxious, on Edge 0 1  Control/stop worrying 0 2  Worry too much - different things 0 2  Trouble relaxing 0 2  Restless 0 2  Easily annoyed or irritable 0 2  Afraid - awful might happen 0 2  Total GAD 7 Score 0 13     Assessment & Plan:  Diagnoses and all orders for this visit:  Healthcare maintenance -     Flu Vaccine QUAD 36+ mos IM  Anxiety and depression -     traZODone (DESYREL) 50 MG tablet; Take 1 tablet (50 mg total) by mouth at bedtime.   There are no diagnoses linked to this encounter.  No orders of the defined types were placed in this encounter.   Follow-up: No Follow-up on file.   Boykin Nearing MD

## 2015-10-23 ENCOUNTER — Telehealth: Payer: Self-pay

## 2015-10-23 NOTE — Telephone Encounter (Signed)
Patient was informed of results 

## 2015-11-01 MED FILL — LISINOPRIL 40 MG TABLET: 40 | 30 days supply | Qty: 30 | Fill #0

## 2015-11-01 MED FILL — AMLODIPINE BESYLATE 10 MG T: 10 | 30 days supply | Qty: 30 | Fill #5

## 2015-12-03 MED FILL — AMLODIPINE BESYLATE 10 MG T: 10 | 30 days supply | Qty: 30 | Fill #0

## 2015-12-03 MED FILL — ?LISINOPRIL 40 MG TABLET: 40 MG | 30 days supply | Qty: 30 | Fill #1

## 2015-12-12 ENCOUNTER — Telehealth: Payer: Self-pay | Admitting: *Deleted

## 2015-12-12 NOTE — Telephone Encounter (Signed)
Called and left patient a voice mail to offer her an appt for 12/25/15 with Dr. Linus Salmons. If she calls back please schedule a new Hep C appt. Myrtis Hopping

## 2015-12-30 MED FILL — PROPRANOLOL ER 80 MG CAP: 80 | 30 days supply | Qty: 30 | Fill #1

## 2015-12-30 MED FILL — traZODone HCL 50 MG TABS: 50 | 30 days supply | Qty: 30 | Fill #1

## 2016-01-01 MED FILL — LISINOPRIL 40 MG TABLET: 40 | 30 days supply | Qty: 30 | Fill #2

## 2016-01-01 MED FILL — ?AMLODIPINE BESYLATE 10 MG: 10 | 30 days supply | Qty: 30 | Fill #1

## 2016-01-31 ENCOUNTER — Telehealth: Payer: Self-pay | Admitting: Family Medicine

## 2016-01-31 DIAGNOSIS — I10 Essential (primary) hypertension: Secondary | ICD-10-CM

## 2016-01-31 MED ORDER — LISINOPRIL 40 MG PO TABS: 40.0000 mg | ORAL_TABLET | Freq: Every day | ORAL | 0 refills | Status: DC

## 2016-01-31 MED ORDER — AMLODIPINE BESYLATE 10 MG PO TABS: 10.0000 mg | ORAL_TABLET | Freq: Every day | ORAL | 0 refills | Status: DC

## 2016-01-31 MED FILL — AMLODIPINE BESYLATE 10 MG T: 10 | 30 days supply | Qty: 30 | Fill #0

## 2016-01-31 MED FILL — LISINOPRIL 40 MG TABLET: 40 | 30 days supply | Qty: 30 | Fill #0

## 2016-01-31 NOTE — Telephone Encounter (Signed)
Requested medications refilled - patient must have office visit for refills.

## 2016-01-31 NOTE — Telephone Encounter (Signed)
Patient is needing amlodipine and lisinopril.

## 2016-02-04 MED FILL — PROPRANOLOL ER 80 MG CAP: 80 | 30 days supply | Qty: 30 | Fill #2

## 2016-05-13 MED FILL — traZODone HCL 50 MG TABS: 50 | 30 days supply | Qty: 30 | Fill #2

## 2016-05-13 MED FILL — LISINOPRIL 40 MG TABLET: 40 | 30 days supply | Qty: 30 | Fill #3

## 2016-05-13 MED FILL — ?AMLODIPINE BESYLATE 10 MG: 10 | 30 days supply | Qty: 30 | Fill #2

## 2016-06-10 MED FILL — ?AMLODIPINE BESYLATE 10 MG: 10 | 30 days supply | Qty: 30 | Fill #3

## 2016-06-10 MED FILL — traZODone HCL 50 MG TABS: 50 | 30 days supply | Qty: 30 | Fill #3

## 2016-06-10 MED FILL — LISINOPRIL 40 MG TABLET: 40 | 30 days supply | Qty: 30 | Fill #4

## 2016-07-13 MED FILL — ?AMLODIPINE BESYLATE 10 MG: 10 | 30 days supply | Qty: 30 | Fill #4

## 2016-07-13 MED FILL — traZODone HCL 50 MG TABS: 50 | 30 days supply | Qty: 30 | Fill #4

## 2016-07-13 MED FILL — LISINOPRIL 40 MG TABLET: 40 | 30 days supply | Qty: 30 | Fill #5

## 2016-07-15 ENCOUNTER — Encounter: Payer: Self-pay | Admitting: Family Medicine

## 2016-08-18 ENCOUNTER — Ambulatory Visit: Payer: Self-pay | Admitting: Family Medicine

## 2016-12-17 ENCOUNTER — Encounter: Payer: Self-pay | Admitting: Family Medicine

## 2016-12-17 ENCOUNTER — Ambulatory Visit: Payer: Medicare HMO | Admitting: Licensed Clinical Social Worker

## 2016-12-17 ENCOUNTER — Other Ambulatory Visit: Payer: Self-pay

## 2016-12-17 ENCOUNTER — Ambulatory Visit: Payer: Medicare HMO | Attending: Internal Medicine | Admitting: Family Medicine

## 2016-12-17 VITALS — BP 164/74 | HR 84 | Temp 98.4°F | Resp 18 | Ht 63.0 in | Wt 168.0 lb

## 2016-12-17 DIAGNOSIS — B182 Chronic viral hepatitis C: Secondary | ICD-10-CM | POA: Diagnosis not present

## 2016-12-17 DIAGNOSIS — Z8249 Family history of ischemic heart disease and other diseases of the circulatory system: Secondary | ICD-10-CM | POA: Diagnosis not present

## 2016-12-17 DIAGNOSIS — Z91013 Allergy to seafood: Secondary | ICD-10-CM | POA: Diagnosis not present

## 2016-12-17 DIAGNOSIS — F32A Depression, unspecified: Secondary | ICD-10-CM

## 2016-12-17 DIAGNOSIS — I10 Essential (primary) hypertension: Secondary | ICD-10-CM

## 2016-12-17 DIAGNOSIS — F419 Anxiety disorder, unspecified: Secondary | ICD-10-CM

## 2016-12-17 DIAGNOSIS — R0989 Other specified symptoms and signs involving the circulatory and respiratory systems: Secondary | ICD-10-CM | POA: Diagnosis not present

## 2016-12-17 DIAGNOSIS — I499 Cardiac arrhythmia, unspecified: Secondary | ICD-10-CM | POA: Diagnosis not present

## 2016-12-17 DIAGNOSIS — M19041 Primary osteoarthritis, right hand: Secondary | ICD-10-CM | POA: Insufficient documentation

## 2016-12-17 DIAGNOSIS — Z79899 Other long term (current) drug therapy: Secondary | ICD-10-CM | POA: Diagnosis not present

## 2016-12-17 DIAGNOSIS — Z8261 Family history of arthritis: Secondary | ICD-10-CM | POA: Diagnosis not present

## 2016-12-17 DIAGNOSIS — Z886 Allergy status to analgesic agent status: Secondary | ICD-10-CM | POA: Diagnosis not present

## 2016-12-17 DIAGNOSIS — Z87891 Personal history of nicotine dependence: Secondary | ICD-10-CM | POA: Diagnosis not present

## 2016-12-17 DIAGNOSIS — F329 Major depressive disorder, single episode, unspecified: Secondary | ICD-10-CM | POA: Diagnosis not present

## 2016-12-17 DIAGNOSIS — M19042 Primary osteoarthritis, left hand: Secondary | ICD-10-CM | POA: Diagnosis not present

## 2016-12-17 DIAGNOSIS — J302 Other seasonal allergic rhinitis: Secondary | ICD-10-CM | POA: Diagnosis not present

## 2016-12-17 MED ORDER — CLONIDINE HCL 0.1 MG PO TABS
0.2000 mg | ORAL_TABLET | Freq: Once | ORAL | Status: AC
  Administered 2016-12-17: 0.2 mg via ORAL

## 2016-12-17 MED ORDER — LISINOPRIL 40 MG PO TABS: 40.0000 mg | ORAL_TABLET | Freq: Every day | ORAL | 0 refills | Status: DC

## 2016-12-17 MED ORDER — AMLODIPINE BESYLATE 10 MG PO TABS: 10.0000 mg | ORAL_TABLET | Freq: Every day | ORAL | 5 refills | Status: DC

## 2016-12-17 MED ORDER — ESCITALOPRAM OXALATE 20 MG PO TABS: 20.0000 mg | ORAL_TABLET | Freq: Every day | ORAL | 5 refills | Status: DC

## 2016-12-17 MED ORDER — TRAZODONE HCL 50 MG PO TABS: 50.0000 mg | ORAL_TABLET | Freq: Every day | ORAL | 5 refills | Status: DC

## 2016-12-17 MED ORDER — PROPRANOLOL HCL ER 80 MG PO CP24: 80.0000 mg | ORAL_CAPSULE | Freq: Every day | ORAL | 5 refills | Status: DC

## 2016-12-17 MED FILL — PROPRANOLOL ER 80 MG CAP: 80 | 30 days supply | Qty: 30 | Fill #0

## 2016-12-17 MED FILL — traZODone HCL 50 MG TABS: 50 | 30 days supply | Qty: 30 | Fill #0

## 2016-12-17 MED FILL — LISINOPRIL 40 MG TABLET: 40 | 30 days supply | Qty: 30 | Fill #0

## 2016-12-17 MED FILL — ESCITALOPRAM 20 MG TABLET: 20 | 30 days supply | Qty: 30 | Fill #0

## 2016-12-17 MED FILL — AMLODIPINE BESYLATE 10 MG T: 10 | 30 days supply | Qty: 30 | Fill #0

## 2016-12-17 NOTE — Patient Instructions (Signed)
Major Depressive Disorder, Adult Major depressive disorder (MDD) is a mental health condition. MDD often makes you feel sad, hopeless, or helpless. MDD can also cause symptoms in your body. MDD can affect your:  Work.  School.  Relationships.  Other normal activities.  MDD can range from mild to very bad. It may occur once (single episode MDD). It can also occur many times (recurrent MDD). The main symptoms of MDD often include:  Feeling sad, depressed, or irritable most of the time.  Loss of interest.  MDD symptoms also include:  Sleeping too much or too little.  Eating too much or too little.  A change in your weight.  Feeling tired (fatigue) or having low energy.  Feeling worthless.  Feeling guilty.  Trouble making decisions.  Trouble thinking clearly.  Thoughts of suicide or harming others.  Feeling weak.  Feeling agitated.  Keeping yourself from being around other people (isolation).  Follow these instructions at home: Activity  Do these things as told by your doctor: ? Go back to your normal activities. ? Exercise regularly. ? Spend time outdoors. Alcohol  Talk with your doctor about how alcohol can affect your antidepressant medicines.  Do not drink alcohol. Or, limit how much alcohol you drink. ? This means no more than 1 drink a day for nonpregnant women and 2 drinks a day for men. One drink equals one of these:  12 oz of beer.  5 oz of wine.  1 oz of hard liquor. General instructions  Take over-the-counter and prescription medicines only as told by your doctor.  Eat a healthy diet.  Get plenty of sleep.  Find activities that you enjoy. Make time to do them.  Think about joining a support group. Your doctor may be able to suggest a group for you.  Keep all follow-up visits as told by your doctor. This is important. Where to find more information:  [employer] on Mental Illness: ? www.nami.[place]: ? https://carter.com/  National Suicide Prevention Lifeline: ? 367-810-6181. This is free, 24-hour help. Contact a doctor if:  Your symptoms get worse.  You have new symptoms. Get help right away if:  You self-harm.  You see, hear, taste, smell, or feel things that are not present (hallucinate). If you ever feel like you may hurt yourself or others, or have thoughts about taking your own life, get help right away. You can go to your nearest emergency department or call:  Your local emergency services (911 in the [place]  A suicide crisis helpline, such as the National Suicide Prevention Lifeline: ? 317 481 6693. This is open 24 hours a day.  This information is not intended to replace advice given to you by your health care provider. Make sure you discuss any questions you have with your health care provider. Document Released: 01/28/2015 Document Revised: 11/03/2015 Document Reviewed: 11/03/2015 Elsevier Interactive Patient Education  2017 [employer]. Hypertension Hypertension is another name for high blood pressure. High blood pressure forces your heart to work harder to pump blood. This can cause problems over time. There are two numbers in a blood pressure reading. There is a top number (systolic) over a bottom number (diastolic). It is best to have a blood pressure below 120/80. Healthy choices can help lower your blood pressure. You may need medicine to help lower your blood pressure if:  Your blood pressure cannot be lowered with healthy choices.  Your blood pressure is higher than 130/80.  Follow these  instructions at home: Eating and drinking  If directed, follow the DASH eating plan. This diet includes: ? Filling half of your plate at each meal with fruits and vegetables. ? Filling one quarter of your plate at each meal with whole grains. Whole grains include whole wheat pasta, brown rice, and whole grain bread. ? Eating or drinking low-fat dairy  products, such as skim milk or low-fat yogurt. ? Filling one quarter of your plate at each meal with low-fat (lean) proteins. Low-fat proteins include fish, skinless chicken, eggs, beans, and tofu. ? Avoiding fatty meat, cured and processed meat, or chicken with skin. ? Avoiding premade or processed food.  Eat less than 1,500 mg of salt (sodium) a day.  Limit alcohol use to no more than 1 drink a day for nonpregnant women and 2 drinks a day for men. One drink equals 12 oz of beer, 5 oz of wine, or 1 oz of hard liquor. Lifestyle  Work with your doctor to stay at a healthy weight or to lose weight. Ask your doctor what the best weight is for you.  Get at least 30 minutes of exercise that causes your heart to beat faster (aerobic exercise) most days of the week. This may include walking, swimming, or biking.  Get at least 30 minutes of exercise that strengthens your muscles (resistance exercise) at least 3 days a week. This may include lifting weights or pilates.  Do not use any products that contain nicotine or tobacco. This includes cigarettes and e-cigarettes. If you need help quitting, ask your doctor.  Check your blood pressure at home as told by your doctor.  Keep all follow-up visits as told by your doctor. This is important. Medicines  Take over-the-counter and prescription medicines only as told by your doctor. Follow directions carefully.  Do not skip doses of blood pressure medicine. The medicine does not work as well if you skip doses. Skipping doses also puts you at risk for problems.  Ask your doctor about side effects or reactions to medicines that you should watch for. Contact a doctor if:  You think you are having a reaction to the medicine you are taking.  You have headaches that keep coming back (recurring).  You feel dizzy.  You have swelling in your ankles.  You have trouble with your vision. Get help right away if:  You get a very bad headache.  You  start to feel confused.  You feel weak or numb.  You feel faint.  You get very bad pain in your: ? Chest. ? Belly (abdomen).  You throw up (vomit) more than once.  You have trouble breathing. Summary  Hypertension is another name for high blood pressure.  Making healthy choices can help lower blood pressure. If your blood pressure cannot be controlled with healthy choices, you may need to take medicine. This information is not intended to replace advice given to you by your health care provider. Make sure you discuss any questions you have with your health care provider. Document Released: 08/05/2007 Document Revised: 01/15/2016 Document Reviewed: 01/15/2016 Elsevier Interactive Patient Education  [employer].

## 2016-12-17 NOTE — Progress Notes (Signed)
Patient ID: [name], female    DOB: 08-Nov-1950, 66 y.o.   MRN: [id number]  PCP: No primary care provider on file.  Chief Complaint  Patient presents with  . Medication Refill    Subjective:  HPI [name] is a 66 y.o. female presents for medication management. Medical history significant for hypertension,  hepatitis C, depression, and anxiety. She report has been several months that she's last taken any of her medication. She reports associated headache and lightheadedness, which are exacerbated when she wakes up in the morning. She has not checked her blood pressure at all as she has misplaced her home cuff.  She has not experienced any chest pain, shortness of breath, palpitations, or visual disturbances. She admits to some symptoms of worsening depression over the last several months. She has been staying in the house a lot and not being socially interactive. Reports on average sleep in the house maybe 1 a week. Denies having any suicidal thoughts however endorses fear and no desire to interact with others as the reason for staying in home. She also associates depression with the loss of her son 2 years ago. She would like to get back on her medications in hopes of improving her overall affect and mood. [name] tested positive for hep C back in 2017 and was initially referred to infectious disease. She reports today that she has not received any treatment and did not receive a follow-up call from infectious disease. She would like that referral resubmitted today. She also requests a letter excusing her from jury duty due to overall mental status.  Social History   Social History  . Marital status: Married    Spouse name: N/A  . Number of children: N/A  . Years of education: N/A   Occupational History  . Not on file.   Social History Main Topics  . Smoking status: Former Smoker    Quit date: 03/20/1994  . Smokeless tobacco: Never Used  . Alcohol use No  . Drug use: No  .  Sexual activity: Yes    Birth control/ protection: Condom   Other Topics Concern  . Not on file   Social History Narrative   Recently separated from husband of 82 years.     Family History  Problem Relation Age of Onset  . Arthritis Sister        RA  . Hypertension Mother    Review of Systems See HPI Patient Active Problem List   Diagnosis Date Noted  . Abnormal mammogram of right breast 09/09/2015  . Allergic rhinitis 02/05/2015  . Osteoarthritis of left hand 02/05/2015  . Osteoarthritis of hand, right 09/14/2014  . Grieving 05/25/2014  . HTN (hypertension) 03/20/2014  . Anxiety and depression 03/20/2014  . Hepatitis C 07/17/2011    Allergies  Allergen Reactions  . Fish Allergy Shortness Of Breath  . Strawberry Extract Shortness Of Breath  . Aspirin Nausea Only    Prior to Admission medications   Medication Sig Start Date End Date Taking? Authorizing Provider  acetaminophen (TYLENOL 8 HOUR) 650 MG CR tablet Take 1 tablet (650 mg total) by mouth every 8 (eight) hours as needed for pain. 11/14/14  Yes Funches, Josalyn, MD  amLODipine (NORVASC) 10 MG tablet Take 1 tablet (10 mg total) by mouth daily. 01/31/16  Yes Funches, Josalyn, MD  cetirizine (ZYRTEC) 10 MG tablet Take 1 tablet (10 mg total) by mouth daily. 09/09/15  Yes Boykin Nearing, MD  Elastic Bandages & Supports (  WRIST SPLINT/ELASTIC LEFT MED) MISC 1 each by Does not apply route daily. 02/05/15  Yes Boykin Nearing, MD  Elastic Bandages & Supports (WRIST SPLINT/ELASTIC RIGHT MED) MISC 1 each by Does not apply route daily. 02/05/15  Yes Funches, Josalyn, MD  escitalopram (LEXAPRO) 20 MG tablet Take 1 tablet (20 mg total) by mouth daily. 09/09/15  Yes Funches, Josalyn, MD  lisinopril (PRINIVIL,ZESTRIL) 40 MG tablet Take 1 tablet (40 mg total) by mouth daily. 01/31/16  Yes Funches, Josalyn, MD  LORazepam (ATIVAN) 0.5 MG tablet Take 1 tablet (0.5 mg total) by mouth every 8 (eight) hours as needed for anxiety or sleep.  09/09/15  Yes Funches, Josalyn, MD  propranolol ER (INDERAL LA) 80 MG 24 hr capsule Take 1 capsule (80 mg total) by mouth at bedtime. 10/21/15  Yes Funches, Adriana Mccallum, MD  traZODone (DESYREL) 50 MG tablet Take 1 tablet (50 mg total) by mouth at bedtime. 10/21/15  Yes Boykin Nearing, MD    Past Medical, Surgical Family and Social History reviewed and updated.    Objective:     Wt Readings from Last 3 Encounters:  12/17/16 168 lb (76.2 kg)  10/21/15 160 lb 6.4 oz (72.8 kg)  09/09/15 157 lb 12.8 oz (71.6 kg)    Physical Exam  Constitutional: She is oriented to person, place, and time. She appears well-developed and well-nourished.  HENT:  Head: Normocephalic and atraumatic.  Eyes: Pupils are equal, round, and reactive to light. Conjunctivae and EOM are normal.  Neck: Normal range of motion. Neck supple.  Cardiovascular: Normal rate and intact distal pulses.  A regularly irregular rhythm present.  Pulmonary/Chest: Effort normal and breath sounds normal.  Musculoskeletal: Normal range of motion.  Neurological: She is alert and oriented to person, place, and time. Coordination normal.  Skin: Skin is dry.  Psychiatric: She has a normal mood and affect. Her behavior is normal. Judgment and thought content normal.   Assessment & Plan:  1. Essential hypertension, -uncontrolled.  Resuming home medications. cloNIDine (CATAPRES) tablet 0.2 mg; Take 2 tablets (0.2 mg total) by mouth once, now. - lisinopril (PRINIVIL,ZESTRIL) 40 MG tablet; Take 1 tablet (40 mg total) by mouth daily.   - amLODipine (NORVASC) 10 MG tablet; Take 1 tablet (10 mg total) by mouth daily.   - TSH - Comprehensive metabolic panel -Urine Dipstick   2. Chronic hepatitis C without hepatic coma (HCC) - Ambulatory referral to Infectious Disease - CBC with Differential  3. Anxiety and depression, currently active. Counseled on exercise such as walking and engaging in social activities at least once per week. - traZODone  (DESYREL) 50 MG tablet; Take 1 tablet (50 mg total) by mouth at bedtime. - escitalopram (LEXAPRO) 20 MG tablet; Take 1 tablet (20 mg total) by mouth daily.   - TSH  4. Other seasonal allergic rhinitis -Continue Cetirizine   5. Irregular heart beat - EKG 12-Lead-NSR , negative ischemia/ST changes   Establish care with primary care provider. Patient given letter requesting dismissal of jury duty. Return for blood pressure check in 2 weeks.   Carroll Sage. Kenton Kingfisher, MSN, [facility] and [address], [place], [place] [address] [phone]

## 2016-12-17 NOTE — BH Specialist Note (Signed)
Integrated Behavioral Health Initial Visit  MRN: [id number] Name: [name]  Number of [place] Clinician visits:: 1/6 Session Start time: 2:50 PM  Session End time: 3:15 PM Total time: 25 minutes  Type of Service: [place] Interpretor:No. Interpretor Name and Language: N/A   Warm Hand Off Completed.       SUBJECTIVE: [name] is a 66 y.o. female accompanied by self Patient was referred by FNP Kenton Kingfisher for depression. Patient reports the following symptoms/concerns: overwhelming feelings of sadness and worry, anxiety attacks, difficulty sleeping, low energy, difficulty concentrating, withdrawn, irritability, and hx of suicidal ideation Duration of problem: Ongoing; Severity of problem: severe  OBJECTIVE: Mood: Anxious and Affect: Appropriate Risk of harm to self or others: No plan to harm self or others  LIFE CONTEXT: Family and Social: Pt resides alone. She reports having friends and family check on her; however, she has been displaying withdrawn behavior School/Work:  Self-Care: No report of substance use Life Changes: Pt is experiencing increased anxiety and depression. She has not been compliant with medications to manage ongoing medical concerns. Pt is grieving the loss of her son from two years ago  GOALS ADDRESSED: Patient will: 1. Reduce symptoms of: anxiety and depression 2. Increase knowledge and/or ability of: coping skills  3. Demonstrate ability to: Increase adequate support systems for patient/family, Increase motivation to adhere to plan of care and Improve medication compliance  INTERVENTIONS: Interventions utilized: Mindfulness or Psychologist, educational, Supportive Counseling, Psychoeducation and/or Health Education and Link to [employer]  Standardized Assessments completed: GAD-7 and PHQ 2&9 with C-SSRS  ASSESSMENT: Patient currently experiencing depression and anxiety triggered by  ongoing medical concerns and the loss of her son. She reports overwhelming feelings of sadness and worry, anxiety attacks, difficulty sleeping, low energy, difficulty concentrating, withdrawn, irritability, and hx of suicidal ideation. Pt denies current SI/HI/AVH. LCSWA inquired about protective factors and provided pt with crisis intervention resources.   Patient may benefit from psychoeducation, psychotherapy, and medication management. LCSWA educated pt on the correlation between one's physical and mental health. Pt was taught grounding exercises to assist with ongoing panic attacks and was strongly encouraged to comply with medication management to assist with improving hypertension and behavioral health. Pt is not interested in psychotherapy at this time.    PLAN: 1. Follow up with behavioral health clinician on : Pt was encouraged to contact LCSWA if symptoms worsen or fail to improve to schedule behavioral appointments at [facility]. 2. Behavioral recommendations: LCSWA recommends that pt apply healthy coping skills discussed, comply with medications, and utilize provided resourcs. Pt is encouraged to schedule follow up appointment with LCSWA 3. Referral(s): [place] (In Clinic) 4. "From scale of 1-10, how likely are you to follow plan?": 8/10  Rebekah Chesterfield, LCSW 12/18/16 4:24 PM

## 2016-12-18 LAB — CBC WITH DIFFERENTIAL/PLATELET
BASOS: 0 %
Basophils Absolute: 0 10*3/uL (ref 0.0–0.2)
EOS (ABSOLUTE): 0.1 10*3/uL (ref 0.0–0.4)
EOS: 3 %
HEMATOCRIT: 41.7 % (ref 34.0–46.6)
Hemoglobin: 13.7 g/dL (ref 11.1–15.9)
IMMATURE GRANS (ABS): 0 10*3/uL (ref 0.0–0.1)
Immature Granulocytes: 0 %
Lymphocytes Absolute: 2.1 10*3/uL (ref 0.7–3.1)
Lymphs: 49 %
MCH: 28.9 pg (ref 26.6–33.0)
MCHC: 32.9 g/dL (ref 31.5–35.7)
MCV: 88 fL (ref 79–97)
MONOS ABS: 0.5 10*3/uL (ref 0.1–0.9)
Monocytes: 13 %
NEUTROS ABS: 1.5 10*3/uL (ref 1.4–7.0)
NEUTROS PCT: 35 %
Platelets: 235 10*3/uL (ref 150–379)
RBC: 4.74 x10E6/uL (ref 3.77–5.28)
RDW: 14.3 % (ref 12.3–15.4)
WBC: 4.3 10*3/uL (ref 3.4–10.8)

## 2016-12-18 LAB — COMPREHENSIVE METABOLIC PANEL
A/G RATIO: 1.6 (ref 1.2–2.2)
ALBUMIN: 4.7 g/dL (ref 3.6–4.8)
ALT: 56 IU/L — AB (ref 0–32)
AST: 61 IU/L — ABNORMAL HIGH (ref 0–40)
Alkaline Phosphatase: 96 IU/L (ref 39–117)
BUN / CREAT RATIO: 20 (ref 12–28)
BUN: 16 mg/dL (ref 8–27)
Bilirubin Total: 0.5 mg/dL (ref 0.0–1.2)
CALCIUM: 9.9 mg/dL (ref 8.7–10.3)
CO2: 24 mmol/L (ref 20–29)
Chloride: 101 mmol/L (ref 96–106)
Creatinine, Ser: 0.82 mg/dL (ref 0.57–1.00)
GFR, EST AFRICAN AMERICAN: 87 mL/min/{1.73_m2} (ref >59)
GFR, EST NON AFRICAN AMERICAN: 75 mL/min/{1.73_m2} (ref >59)
Globulin, Total: 3 g/dL (ref 1.5–4.5)
Glucose: 99 mg/dL (ref 65–99)
POTASSIUM: 4.2 mmol/L (ref 3.5–5.2)
Sodium: 141 mmol/L (ref 134–144)
TOTAL PROTEIN: 7.7 g/dL (ref 6.0–8.5)

## 2016-12-18 LAB — TSH: TSH: 0.494 u[IU]/mL (ref 0.450–4.500)

## 2017-01-11 ENCOUNTER — Other Ambulatory Visit: Payer: Self-pay | Admitting: Family Medicine

## 2017-01-11 DIAGNOSIS — I10 Essential (primary) hypertension: Secondary | ICD-10-CM

## 2017-01-11 MED FILL — traZODone HCL 50 MG TABS: 50 | 30 days supply | Qty: 30 | Fill #1

## 2017-01-11 MED FILL — PROPRANOLOL ER 80 MG CAP: 80 | 30 days supply | Qty: 30 | Fill #1

## 2017-01-11 MED FILL — AMLODIPINE BESYLATE 10 MG T: 10 | 30 days supply | Qty: 30 | Fill #1

## 2017-01-11 MED FILL — ESCITALOPRAM 20 MG TABLET: 20 | 30 days supply | Qty: 30 | Fill #1

## 2017-01-11 NOTE — Telephone Encounter (Signed)
Patient has not reestablished. Saw [facility] 3 weeks ago. Please refill and will request OV

## 2017-01-11 NOTE — Telephone Encounter (Signed)
I saw that but she didn't know who the patient was going to be seeing over there and if they should take it over.

## 2017-01-11 NOTE — Telephone Encounter (Signed)
This was ordered by Provider Kenton Kingfisher three weeks ago.

## 2017-01-11 NOTE — Telephone Encounter (Signed)
O I understand. This is from the day she saw the patients at [facility]. I will request a refill and OV for her. Thank you!

## 2017-01-13 MED FILL — LISINOPRIL 40 MG TABS: 40 | 30 days supply | Qty: 30 | Fill #0

## 2017-03-03 ENCOUNTER — Other Ambulatory Visit: Payer: Self-pay | Admitting: Internal Medicine

## 2017-03-03 DIAGNOSIS — I10 Essential (primary) hypertension: Secondary | ICD-10-CM

## 2017-03-03 MED FILL — ESCITALOPRAM 20 MG TABLET: 20 | 30 days supply | Qty: 30 | Fill #2

## 2017-03-03 MED FILL — AMLODIPINE BESYLATE 10 MG T: 10 | 30 days supply | Qty: 30 | Fill #2

## 2017-03-03 MED FILL — traZODone HCL 50 MG TABS: 50 | 30 days supply | Qty: 30 | Fill #2

## 2017-03-03 MED FILL — LISINOPRIL 40 MG TAB: 40 | 30 days supply | Qty: 30 | Fill #0

## 2017-03-03 MED FILL — PROPRANOLOL ER 80 MG CAP: 80 | 30 days supply | Qty: 30 | Fill #2

## 2017-05-05 ENCOUNTER — Other Ambulatory Visit: Payer: Self-pay | Admitting: Internal Medicine

## 2017-05-05 DIAGNOSIS — I10 Essential (primary) hypertension: Secondary | ICD-10-CM

## 2017-05-05 MED FILL — ESCITALOPRAM 20 MG TABLET: 20 | 30 days supply | Qty: 30 | Fill #3

## 2017-05-05 MED FILL — traZODone HCL 50 MG TABS: 50 | 30 days supply | Qty: 30 | Fill #3

## 2017-05-05 MED FILL — AMLODIPINE BESYLATE 10 MG T: 10 | 30 days supply | Qty: 30 | Fill #3

## 2017-05-06 ENCOUNTER — Other Ambulatory Visit: Payer: Self-pay | Admitting: Pharmacist

## 2017-05-06 DIAGNOSIS — I10 Essential (primary) hypertension: Secondary | ICD-10-CM

## 2017-05-06 MED ORDER — LISINOPRIL 40 MG PO TABS: 40.0000 mg | ORAL_TABLET | Freq: Every day | ORAL | 0 refills | Status: DC

## 2017-05-06 MED FILL — LISINOPRIL 40 MG TAB: 40 | 30 days supply | Qty: 30 | Fill #0

## 2017-06-01 ENCOUNTER — Encounter: Payer: Self-pay | Admitting: Family Medicine

## 2017-06-01 ENCOUNTER — Ambulatory Visit: Payer: Medicare HMO | Attending: Family Medicine | Admitting: Family Medicine

## 2017-06-01 VITALS — BP 180/76 | HR 56 | Temp 98.0°F | Ht 63.0 in | Wt 164.0 lb

## 2017-06-01 DIAGNOSIS — F329 Major depressive disorder, single episode, unspecified: Secondary | ICD-10-CM | POA: Diagnosis not present

## 2017-06-01 DIAGNOSIS — Z886 Allergy status to analgesic agent status: Secondary | ICD-10-CM | POA: Insufficient documentation

## 2017-06-01 DIAGNOSIS — I10 Essential (primary) hypertension: Secondary | ICD-10-CM | POA: Diagnosis not present

## 2017-06-01 DIAGNOSIS — Z79899 Other long term (current) drug therapy: Secondary | ICD-10-CM | POA: Diagnosis not present

## 2017-06-01 DIAGNOSIS — F419 Anxiety disorder, unspecified: Secondary | ICD-10-CM | POA: Diagnosis not present

## 2017-06-01 DIAGNOSIS — B1920 Unspecified viral hepatitis C without hepatic coma: Secondary | ICD-10-CM | POA: Diagnosis not present

## 2017-06-01 DIAGNOSIS — J302 Other seasonal allergic rhinitis: Secondary | ICD-10-CM

## 2017-06-01 DIAGNOSIS — Z90710 Acquired absence of both cervix and uterus: Secondary | ICD-10-CM | POA: Insufficient documentation

## 2017-06-01 DIAGNOSIS — Z9012 Acquired absence of left breast and nipple: Secondary | ICD-10-CM | POA: Diagnosis not present

## 2017-06-01 DIAGNOSIS — Z0000 Encounter for general adult medical examination without abnormal findings: Secondary | ICD-10-CM

## 2017-06-01 DIAGNOSIS — Z853 Personal history of malignant neoplasm of breast: Secondary | ICD-10-CM | POA: Insufficient documentation

## 2017-06-01 DIAGNOSIS — F32A Depression, unspecified: Secondary | ICD-10-CM

## 2017-06-01 MED ORDER — LISINOPRIL 40 MG PO TABS: 40.0000 mg | ORAL_TABLET | Freq: Every day | ORAL | 6 refills | Status: DC

## 2017-06-01 MED ORDER — ESCITALOPRAM OXALATE 20 MG PO TABS: 20.0000 mg | ORAL_TABLET | Freq: Every day | ORAL | 6 refills | Status: DC

## 2017-06-01 MED ORDER — PNEUMOCOCCAL 13-VAL CONJ VACC IM SUSP: 0.5000 mL | INTRAMUSCULAR | 0 refills | Status: AC

## 2017-06-01 MED ORDER — AMLODIPINE BESYLATE 10 MG PO TABS: 10.0000 mg | ORAL_TABLET | Freq: Every day | ORAL | 6 refills | Status: DC

## 2017-06-01 MED ORDER — CETIRIZINE HCL 10 MG PO TABS: 10.0000 mg | ORAL_TABLET | Freq: Every day | ORAL | 6 refills | Status: AC

## 2017-06-01 MED ORDER — TRAZODONE HCL 50 MG PO TABS: 50.0000 mg | ORAL_TABLET | Freq: Every day | ORAL | 6 refills | Status: DC

## 2017-06-01 MED ORDER — TETANUS-DIPHTH-ACELL PERTUSSIS 5-2.5-18.5 LF-MCG/0.5 IM SUSP: 0.5000 mL | Freq: Once | INTRAMUSCULAR | 0 refills | Status: AC

## 2017-06-01 MED ORDER — PROPRANOLOL HCL ER 80 MG PO CP24: 80.0000 mg | ORAL_CAPSULE | Freq: Every day | ORAL | 6 refills | Status: DC

## 2017-06-01 MED FILL — LISINOPRIL 40 MG TAB: 40 | 30 days supply | Qty: 30 | Fill #0

## 2017-06-01 MED FILL — BOOSTRIX VACCINE SYRINGE: 5-2.5-18.5 | 1 days supply | Qty: 1 | Fill #0

## 2017-06-01 MED FILL — traZODone HCL 50 MG TABS: 50 | 30 days supply | Qty: 30 | Fill #0

## 2017-06-01 MED FILL — PREVNAR 13 SYRINGE: 1 days supply | Qty: 1 | Fill #0

## 2017-06-01 MED FILL — AMLODIPINE BESYLATE 10 MG T: 10 | 30 days supply | Qty: 30 | Fill #0

## 2017-06-01 MED FILL — PROPRANOLOL ER 80 MG CAP: 80 | 30 days supply | Qty: 30 | Fill #0

## 2017-06-01 MED FILL — ESCITALOPRAM 20 MG TABLET: 20 | 30 days supply | Qty: 30 | Fill #0

## 2017-06-01 NOTE — Patient Instructions (Signed)

## 2017-06-01 NOTE — Progress Notes (Signed)
Subjective:  Patient ID: [name], female    DOB: 04-12-1950  Age: 67 y.o. MRN: [id number]  CC: Hypertension and Medication Refill   HPI [name] is a 67 year old female with a history of hypertension, anxiety and depression, history of breast cancer (status post left mastectomy over 16 years ago) who presents today to establish care with me. Her blood pressure is significantly elevated and she endorses taking just Lisinopril this morning and plans to take Amlodipine when she gets home later in the day and takes propranolol at night.  She staggers her blood pressure medications to prevent hypotension. She denies chest pains or shortness of breath. She is very active and walks regularly taking the stairs instead of the elevator and is adherent with a low-sodium diet.  With regards to her anxiety and depression, she denies suicidal ideation or intents but states she has been in a low mood recently because last week was the anniversary of her son's passing (he died in a motor vehicle crash). She has no additional concerns today.   Past Medical History:  Diagnosis Date  . Breast cancer ([place]) 2004   left breast, Paget's disease, full involvement of nipple with invasive ductal carcinoma  . Hepatitis C 07/17/2011   Positive Hepatitis C Antibody test and then confirmed with Hepatitis C Virus RNA by PCR.  Marland Kitchen Hypercholesteremia    dx 2010  . Hypertension    dx 1996  . Leukopenia 07/10/2011   Stable compared to 2012    Past Surgical History:  Procedure Laterality Date  . ABDOMINAL HYSTERECTOMY     late 1990s.  Marland Kitchen BREAST SURGERY Left    2004  . MASTECTOMY  2005   left side    Allergies  Allergen Reactions  . Fish Allergy Shortness Of Breath  . Strawberry Extract Shortness Of Breath  . Aspirin Nausea Only     Outpatient Medications Prior to Visit  Medication Sig Dispense Refill  . acetaminophen (TYLENOL 8 HOUR) 650 MG CR tablet Take 1 tablet (650 mg total) by mouth every  8 (eight) hours as needed for pain. 60 tablet 2  . Elastic Bandages & Supports (WRIST SPLINT/ELASTIC LEFT MED) MISC 1 each by Does not apply route daily. 1 each 0  . Elastic Bandages & Supports (WRIST SPLINT/ELASTIC RIGHT MED) MISC 1 each by Does not apply route daily. 1 each 0  . amLODipine (NORVASC) 10 MG tablet Take 1 tablet (10 mg total) by mouth daily. 30 tablet 5  . cetirizine (ZYRTEC) 10 MG tablet Take 1 tablet (10 mg total) by mouth daily. 30 tablet 3  . escitalopram (LEXAPRO) 20 MG tablet Take 1 tablet (20 mg total) by mouth daily. 30 tablet 5  . lisinopril (PRINIVIL,ZESTRIL) 40 MG tablet Take 1 tablet (40 mg total) by mouth daily. 30 tablet 0  . propranolol ER (INDERAL LA) 80 MG 24 hr capsule Take 1 capsule (80 mg total) by mouth at bedtime. 30 capsule 5  . traZODone (DESYREL) 50 MG tablet Take 1 tablet (50 mg total) by mouth at bedtime. 30 tablet 5  . LORazepam (ATIVAN) 0.5 MG tablet Take 1 tablet (0.5 mg total) by mouth every 8 (eight) hours as needed for anxiety or sleep. (Patient not taking: Reported on 06/01/2017) 30 tablet 0   No facility-administered medications prior to visit.     ROS Review of Systems  Constitutional: Negative for activity change, appetite change and fatigue.  HENT: Negative for congestion, sinus pressure and sore throat.  Eyes: Negative for visual disturbance.  Respiratory: Negative for cough, chest tightness, shortness of breath and wheezing.   Cardiovascular: Negative for chest pain and palpitations.  Gastrointestinal: Negative for abdominal distention, abdominal pain and constipation.  Endocrine: Negative for polydipsia.  Genitourinary: Negative for dysuria and frequency.  Musculoskeletal: Negative for arthralgias and back pain.  Skin: Negative for rash.  Neurological: Negative for tremors, light-headedness and numbness.  Hematological: Does not bruise/bleed easily.  Psychiatric/Behavioral: Negative for agitation and behavioral problems.     Objective:  BP (!) 180/76   Pulse (!) 56   Temp 98 F (36.7 C) (Oral)   Ht '5\' 3"'$  (1.6 m)   Wt 164 lb (74.4 kg)   SpO2 100%   BMI 29.05 kg/m   BP/Weight 06/01/2017 12/17/2016 4/50/3888  Systolic BP 280 034 917  Diastolic BP 76 74 90  Wt. (Lbs) 164 168 160.4  BMI 29.05 29.76 28.41      Physical Exam  Constitutional: She is oriented to person, place, and time. She appears well-developed and well-nourished.  Cardiovascular: Normal rate, normal heart sounds and intact distal pulses.  No murmur heard. Pulmonary/Chest: Effort normal and breath sounds normal. She has no wheezes. She has no rales. She exhibits no tenderness.  Abdominal: Soft. Bowel sounds are normal. She exhibits no distension and no mass. There is no tenderness.  Musculoskeletal: Normal range of motion.  Neurological: She is alert and oriented to person, place, and time.  Skin: Skin is warm and dry.  Psychiatric: She has a normal mood and affect.      CMP Latest Ref Rng & Units 12/17/2016 10/21/2015 04/03/2014  Glucose 65 - 99 mg/dL 99 87 128(H)  BUN 8 - 27 mg/dL '16 10 12  '$ Creatinine 0.57 - 1.00 mg/dL 0.82 0.90 0.87  Sodium 134 - 144 mmol/L 141 137 139  Potassium 3.5 - 5.2 mmol/L 4.2 4.3 4.1  Chloride 96 - 106 mmol/L 101 102 104  CO2 20 - 29 mmol/L '24 26 26  '$ Calcium 8.7 - 10.3 mg/dL 9.9 9.5 9.9  Total Protein 6.0 - 8.5 g/dL 7.7 - -  Total Bilirubin 0.0 - 1.2 mg/dL 0.5 - -  Alkaline Phos 39 - 117 IU/L 96 - -  AST 0 - 40 IU/L 61(H) - -  ALT 0 - 32 IU/L 56(H) - -    Lipid Panel     Component Value Date/Time   CHOL 212 (H) 03/20/2014 1118   TRIG 99 03/20/2014 1118   HDL 60 03/20/2014 1118   CHOLHDL 3.5 03/20/2014 1118   VLDL 20 03/20/2014 1118   LDLCALC 132 (H) 03/20/2014 1118    Assessment & Plan:   1. Essential hypertension Controlled Took only one antihypertensive this morning-advised to take amlodipine and lisinopril in the morning and propranolol in the evening Low sodium, DASH diet -  amLODipine (NORVASC) 10 MG tablet; Take 1 tablet (10 mg total) by mouth daily.  Dispense: 30 tablet; Refill: 6 - lisinopril (PRINIVIL,ZESTRIL) 40 MG tablet; Take 1 tablet (40 mg total) by mouth daily.  Dispense: 30 tablet; Refill: 6 - propranolol ER (INDERAL LA) 80 MG 24 hr capsule; Take 1 capsule (80 mg total) by mouth at bedtime.  Dispense: 30 capsule; Refill: 6 - Lipid panel - CMP14+EGFR  2. Anxiety and depression Stable - escitalopram (LEXAPRO) 20 MG tablet; Take 1 tablet (20 mg total) by mouth daily.  Dispense: 30 tablet; Refill: 6 - propranolol ER (INDERAL LA) 80 MG 24 hr capsule; Take 1 capsule (80 mg  total) by mouth at bedtime.  Dispense: 30 capsule; Refill: 6 - traZODone (DESYREL) 50 MG tablet; Take 1 tablet (50 mg total) by mouth at bedtime.  Dispense: 30 tablet; Refill: 6  3. Other seasonal allergic rhinitis Controlled - cetirizine (ZYRTEC) 10 MG tablet; Take 1 tablet (10 mg total) by mouth daily.  Dispense: 30 tablet; Refill: 6  4. Healthcare maintenance Declines mammogram, colonoscopy    Meds ordered this encounter  Medications  . amLODipine (NORVASC) 10 MG tablet    Sig: Take 1 tablet (10 mg total) by mouth daily.    Dispense:  30 tablet    Refill:  6  . escitalopram (LEXAPRO) 20 MG tablet    Sig: Take 1 tablet (20 mg total) by mouth daily.    Dispense:  30 tablet    Refill:  6  . lisinopril (PRINIVIL,ZESTRIL) 40 MG tablet    Sig: Take 1 tablet (40 mg total) by mouth daily.    Dispense:  30 tablet    Refill:  6  . propranolol ER (INDERAL LA) 80 MG 24 hr capsule    Sig: Take 1 capsule (80 mg total) by mouth at bedtime.    Dispense:  30 capsule    Refill:  6  . traZODone (DESYREL) 50 MG tablet    Sig: Take 1 tablet (50 mg total) by mouth at bedtime.    Dispense:  30 tablet    Refill:  6  . cetirizine (ZYRTEC) 10 MG tablet    Sig: Take 1 tablet (10 mg total) by mouth daily.    Dispense:  30 tablet    Refill:  6  . Tdap (BOOSTRIX) 5-2.5-18.5 LF-MCG/0.5  injection    Sig: Inject 0.5 mLs into the muscle once for 1 dose.    Dispense:  0.5 mL    Refill:  0  . pneumococcal 13-valent conjugate vaccine (PREVNAR 13) SUSP injection    Sig: Inject 0.5 mLs into the muscle tomorrow at 10 am for 1 dose.    Dispense:  0.5 mL    Refill:  0    Follow-up: Return in about 6 months (around 12/01/2017) for Follow-up of chronic medical conditions.   Charlott Rakes MD

## 2017-06-02 ENCOUNTER — Other Ambulatory Visit: Payer: Self-pay | Admitting: Family Medicine

## 2017-06-02 ENCOUNTER — Telehealth: Payer: Self-pay

## 2017-06-02 DIAGNOSIS — E7800 Pure hypercholesterolemia, unspecified: Secondary | ICD-10-CM | POA: Insufficient documentation

## 2017-06-02 LAB — CMP14+EGFR
ALT: 32 IU/L (ref 0–32)
AST: 42 IU/L — AB (ref 0–40)
Albumin/Globulin Ratio: 1.7 (ref 1.2–2.2)
Albumin: 4.5 g/dL (ref 3.6–4.8)
Alkaline Phosphatase: 79 IU/L (ref 39–117)
BUN/Creatinine Ratio: 11 — ABNORMAL LOW (ref 12–28)
BUN: 14 mg/dL (ref 8–27)
Bilirubin Total: 0.5 mg/dL (ref 0.0–1.2)
CALCIUM: 9.5 mg/dL (ref 8.7–10.3)
CO2: 24 mmol/L (ref 20–29)
CREATININE: 1.28 mg/dL — AB (ref 0.57–1.00)
Chloride: 101 mmol/L (ref 96–106)
GFR calc Af Amer: 50 mL/min/{1.73_m2} — ABNORMAL LOW (ref >59)
GFR, EST NON AFRICAN AMERICAN: 44 mL/min/{1.73_m2} — AB (ref >59)
GLOBULIN, TOTAL: 2.7 g/dL (ref 1.5–4.5)
Glucose: 103 mg/dL — ABNORMAL HIGH (ref 65–99)
Potassium: 3.8 mmol/L (ref 3.5–5.2)
Sodium: 142 mmol/L (ref 134–144)
Total Protein: 7.2 g/dL (ref 6.0–8.5)

## 2017-06-02 LAB — LIPID PANEL
CHOLESTEROL TOTAL: 222 mg/dL — AB (ref 100–199)
Chol/HDL Ratio: 3.1 ratio (ref 0.0–4.4)
HDL: 71 mg/dL (ref >39)
LDL CALC: 133 mg/dL — AB (ref 0–99)
Triglycerides: 91 mg/dL (ref 0–149)
VLDL Cholesterol Cal: 18 mg/dL (ref 5–40)

## 2017-06-02 MED ORDER — ATORVASTATIN CALCIUM 20 MG PO TABS: 20.0000 mg | ORAL_TABLET | Freq: Every day | ORAL | 3 refills | Status: DC

## 2017-06-02 MED FILL — ATORVASTATIN 20 MG TABLET: 20 | 30 days supply | Qty: 30 | Fill #0

## 2017-06-02 NOTE — Telephone Encounter (Signed)
Patient was called and informed of lab results. 

## 2017-06-30 MED FILL — ATORVASTATIN 20 MG TABLET: 20 | 30 days supply | Qty: 30 | Fill #1

## 2017-07-08 MED FILL — ESCITALOPRAM 20 MG TABLET: 20 | 30 days supply | Qty: 30 | Fill #1

## 2017-07-08 MED FILL — traZODone HCL 50 MG TABS: 50 | 30 days supply | Qty: 30 | Fill #1

## 2017-07-08 MED FILL — LISINOPRIL 40 MG TAB: 40 | 30 days supply | Qty: 30 | Fill #1

## 2017-07-08 MED FILL — PROPRANOLOL ER 80 MG CAP: 80 | 30 days supply | Qty: 30 | Fill #1

## 2017-07-08 MED FILL — AMLODIPINE BESYLATE 10 MG T: 10 | 30 days supply | Qty: 30 | Fill #1

## 2017-08-02 MED FILL — ATORVASTATIN 20 MG TABLET: 20 | 30 days supply | Qty: 30 | Fill #2

## 2017-08-19 MED FILL — LISINOPRIL 40 MG TABLET: 40 | 30 days supply | Qty: 30 | Fill #2

## 2017-08-19 MED FILL — PROPRANOLOL ER 80 MG CAP: 80 | 30 days supply | Qty: 30 | Fill #2

## 2017-08-19 MED FILL — ESCITALOPRAM 20 MG TABLET: 20 | 30 days supply | Qty: 30 | Fill #2

## 2017-08-19 MED FILL — AMLODIPINE BESYLATE 10 MG T: 10 | 30 days supply | Qty: 30 | Fill #2

## 2017-08-19 MED FILL — traZODone HCL 50 MG TABS: 50 | 30 days supply | Qty: 30 | Fill #2

## 2017-09-22 MED FILL — PROPRANOLOL ER 80 MG CAP: 80 | 30 days supply | Qty: 30 | Fill #3

## 2017-09-22 MED FILL — ATORVASTATIN 20 MG TABLET: 20 | 30 days supply | Qty: 30 | Fill #3

## 2017-10-05 MED FILL — LISINOPRIL 40 MG TABLET: 40 | 30 days supply | Qty: 30 | Fill #3

## 2017-10-06 ENCOUNTER — Other Ambulatory Visit: Payer: Self-pay

## 2017-10-06 DIAGNOSIS — F419 Anxiety disorder, unspecified: Secondary | ICD-10-CM

## 2017-10-06 DIAGNOSIS — F329 Major depressive disorder, single episode, unspecified: Secondary | ICD-10-CM

## 2017-10-06 DIAGNOSIS — I10 Essential (primary) hypertension: Secondary | ICD-10-CM

## 2017-10-06 DIAGNOSIS — F32A Depression, unspecified: Secondary | ICD-10-CM

## 2017-10-06 MED ORDER — TRAZODONE HCL 50 MG PO TABS: 50.0000 mg | ORAL_TABLET | Freq: Every day | ORAL | 0 refills | Status: DC

## 2017-10-06 MED ORDER — ESCITALOPRAM OXALATE 20 MG PO TABS: 20.0000 mg | ORAL_TABLET | Freq: Every day | ORAL | 2 refills | Status: DC

## 2017-10-06 MED ORDER — LISINOPRIL 40 MG PO TABS: 40.0000 mg | ORAL_TABLET | Freq: Every day | ORAL | 0 refills | Status: DC

## 2017-10-06 MED ORDER — PROPRANOLOL HCL ER 80 MG PO CP24: 80.0000 mg | ORAL_CAPSULE | Freq: Every day | ORAL | 0 refills | Status: DC

## 2017-10-06 MED ORDER — AMLODIPINE BESYLATE 10 MG PO TABS: 10.0000 mg | ORAL_TABLET | Freq: Every day | ORAL | 0 refills | Status: DC

## 2017-10-06 MED ORDER — ATORVASTATIN CALCIUM 20 MG PO TABS: 20.0000 mg | ORAL_TABLET | Freq: Every day | ORAL | 2 refills | Status: DC

## 2017-10-11 ENCOUNTER — Encounter (HOSPITAL_COMMUNITY): Payer: Self-pay

## 2017-10-11 ENCOUNTER — Other Ambulatory Visit: Payer: Self-pay

## 2017-10-11 ENCOUNTER — Emergency Department (HOSPITAL_COMMUNITY): Payer: Medicare HMO

## 2017-10-11 ENCOUNTER — Emergency Department (HOSPITAL_COMMUNITY)
Admission: EM | Admit: 2017-10-11 | Discharge: 2017-10-11 | Disposition: A | Discharge status: Home / Self Care | Payer: Medicare HMO | Attending: Emergency Medicine | Admitting: Emergency Medicine

## 2017-10-11 DIAGNOSIS — Y999 Unspecified external cause status: Secondary | ICD-10-CM | POA: Insufficient documentation

## 2017-10-11 DIAGNOSIS — I10 Essential (primary) hypertension: Secondary | ICD-10-CM | POA: Diagnosis not present

## 2017-10-11 DIAGNOSIS — Z87891 Personal history of nicotine dependence: Secondary | ICD-10-CM | POA: Diagnosis not present

## 2017-10-11 DIAGNOSIS — S8992XA Unspecified injury of left lower leg, initial encounter: Secondary | ICD-10-CM | POA: Diagnosis not present

## 2017-10-11 DIAGNOSIS — Z79899 Other long term (current) drug therapy: Secondary | ICD-10-CM | POA: Insufficient documentation

## 2017-10-11 DIAGNOSIS — S80212A Abrasion, left knee, initial encounter: Secondary | ICD-10-CM | POA: Insufficient documentation

## 2017-10-11 DIAGNOSIS — W19XXXA Unspecified fall, initial encounter: Secondary | ICD-10-CM

## 2017-10-11 DIAGNOSIS — Y929 Unspecified place or not applicable: Secondary | ICD-10-CM | POA: Insufficient documentation

## 2017-10-11 DIAGNOSIS — W0110XA Fall on same level from slipping, tripping and stumbling with subsequent striking against unspecified object, initial encounter: Secondary | ICD-10-CM | POA: Diagnosis not present

## 2017-10-11 DIAGNOSIS — Y9301 Activity, walking, marching and hiking: Secondary | ICD-10-CM | POA: Diagnosis not present

## 2017-10-11 DIAGNOSIS — S80211A Abrasion, right knee, initial encounter: Secondary | ICD-10-CM | POA: Diagnosis not present

## 2017-10-11 DIAGNOSIS — Z853 Personal history of malignant neoplasm of breast: Secondary | ICD-10-CM | POA: Insufficient documentation

## 2017-10-11 DIAGNOSIS — T148XXA Other injury of unspecified body region, initial encounter: Secondary | ICD-10-CM

## 2017-10-11 DIAGNOSIS — M25562 Pain in left knee: Secondary | ICD-10-CM | POA: Diagnosis not present

## 2017-10-11 MED ORDER — ACETAMINOPHEN 325 MG PO TABS
650.0000 mg | ORAL_TABLET | Freq: Once | ORAL | Status: AC
  Administered 2017-10-11: 650 mg via ORAL
  Filled 2017-10-11: qty 2

## 2017-10-11 MED ORDER — ACETAMINOPHEN 325 MG PO TABS: 650.0000 mg | ORAL_TABLET | Freq: Four times a day (QID) | ORAL | 0 refills | Status: AC | PRN

## 2017-10-11 NOTE — ED Triage Notes (Signed)
Pt states she tripped and fell running in the parking lot. No LOC, did not hit her head. Multiple abrasions noted to her knees.

## 2017-10-11 NOTE — ED Notes (Signed)
ED Provider at bedside. 

## 2017-10-11 NOTE — ED Notes (Signed)
Pt given crutches by NT

## 2017-10-11 NOTE — Discharge Instructions (Signed)
Please read attached information. If you experience any new or worsening signs or symptoms please return to the emergency room for evaluation. Please follow-up with your primary care provider or specialist as discussed. Please use medication prescribed only as directed and discontinue taking if you have any concerning signs or symptoms.   °

## 2017-10-11 NOTE — ED Provider Notes (Signed)
[facility] EMERGENCY DEPARTMENT Provider Note   CSN: [id number] Arrival date & time: 10/11/17  1155     History   Chief Complaint Chief Complaint  Patient presents with  . Fall    HPI [name] is a 67 y.o. female.  HPI   20 -year-old female presents today status post fall. Patient she was running to a car when she tripped and fell on bilateral knees and hands. She notes superficial abrasions noted to the bilateral anterior knees and right palmar aspect. Patient notes she has been able to ambulate but with pain in the left anterior knee. Patient notes her tetanus is up-to-date ( April of this year). No medications prior to arrival. no loss of consciousness or head injury.  Past Medical History:  Diagnosis Date  . Breast cancer ([place]) 2004   left breast, Paget's disease, full involvement of nipple with invasive ductal carcinoma  . Hepatitis C 07/17/2011   Positive Hepatitis C Antibody test and then confirmed with Hepatitis C Virus RNA by PCR.  Marland Kitchen Hypercholesteremia    dx 2010  . Hypertension    dx 1996  . Leukopenia 07/10/2011   Stable compared to 2012    Patient Active Problem List   Diagnosis Date Noted  . Hypercholesteremia 06/02/2017  . Abnormal mammogram of right breast 09/09/2015  . Allergic rhinitis 02/05/2015  . Osteoarthritis of left hand 02/05/2015  . Osteoarthritis of hand, right 09/14/2014  . Grieving 05/25/2014  . HTN (hypertension) 03/20/2014  . Anxiety and depression 03/20/2014  . Hepatitis C 07/17/2011    Past Surgical History:  Procedure Laterality Date  . ABDOMINAL HYSTERECTOMY     late 1990s.  Marland Kitchen BREAST SURGERY Left    2004  . MASTECTOMY  2005   left side     OB History   None      Home Medications    Prior to Admission medications   Medication Sig Start Date End Date Taking? Authorizing Provider  acetaminophen (TYLENOL) 325 MG tablet Take 2 tablets (650 mg total) by mouth every 6 (six) hours as needed. 10/11/17    Adonai Selsor, Dellis Filbert, PA-C  amLODipine (NORVASC) 10 MG tablet Take 1 tablet (10 mg total) by mouth daily. MUST MAKE APPT FOR FURTHER REFILLS 10/06/17   Charlott Rakes, MD  atorvastatin (LIPITOR) 20 MG tablet Take 1 tablet (20 mg total) by mouth daily. 10/06/17   Charlott Rakes, MD  cetirizine (ZYRTEC) 10 MG tablet Take 1 tablet (10 mg total) by mouth daily. 06/01/17   Charlott Rakes, MD  Elastic Bandages & Supports (WRIST SPLINT/ELASTIC LEFT MED) MISC 1 each by Does not apply route daily. 02/05/15   Boykin Nearing, MD  Elastic Bandages & Supports (WRIST SPLINT/ELASTIC RIGHT MED) MISC 1 each by Does not apply route daily. 02/05/15   Funches, Adriana Mccallum, MD  escitalopram (LEXAPRO) 20 MG tablet Take 1 tablet (20 mg total) by mouth daily. 10/06/17   Charlott Rakes, MD  lisinopril (PRINIVIL,ZESTRIL) 40 MG tablet Take 1 tablet (40 mg total) by mouth daily. MUST MAKE APPT FOR FURTHER REFILLS 10/06/17   Charlott Rakes, MD  LORazepam (ATIVAN) 0.5 MG tablet Take 1 tablet (0.5 mg total) by mouth every 8 (eight) hours as needed for anxiety or sleep. Patient not taking: Reported on 06/01/2017 09/09/15   Boykin Nearing, MD  propranolol ER (INDERAL LA) 80 MG 24 hr capsule Take 1 capsule (80 mg total) by mouth at bedtime. MUST MAKE APPT FOR FURTHER REFILLS 10/06/17   Charlott Rakes,  MD  traZODone (DESYREL) 50 MG tablet Take 1 tablet (50 mg total) by mouth at bedtime. MUST MAKE APPT FOR FURTHER REFILLS 10/06/17   Charlott Rakes, MD    Family History Family History  Problem Relation Age of Onset  . Hypertension Mother   . Arthritis Sister        RA    Social History Social History   Tobacco Use  . Smoking status: Former Smoker    Last attempt to quit: 03/20/1994    Years since quitting: 23.5  . Smokeless tobacco: Never Used  Substance Use Topics  . Alcohol use: No  . Drug use: No     Allergies   Fish allergy; Strawberry extract; and Aspirin   Review of Systems Review of Systems  All other systems reviewed  and are negative.    Physical Exam Updated Vital Signs BP (!) 199/87 (BP Location: Right Arm)   Pulse 65   Temp 98.6 F (37 C) (Oral)   Resp 20   SpO2 100%   Physical Exam  Constitutional: She is oriented to person, place, and time. She appears well-developed and well-nourished.  HENT:  Head: Normocephalic and atraumatic.  Eyes: Pupils are equal, round, and reactive to light. Conjunctivae are normal. Right eye exhibits no discharge. Left eye exhibits no discharge. No scleral icterus.  Neck: Normal range of motion. No JVD present. No tracheal deviation present.  Pulmonary/Chest: Effort normal. No stridor.  Musculoskeletal:  Bilateral knees with superficial abrasions- joints are supple with full active range of motion, tenderness to palpation of left anterior knee- no significant swelling- no laxity noted  Neurological: She is alert and oriented to person, place, and time. Coordination normal.  Psychiatric: She has a normal mood and affect. Her behavior is normal. Judgment and thought content normal.  Nursing note and vitals reviewed.    ED Treatments / Results  Labs (all labs ordered are listed, but only abnormal results are displayed) Labs Reviewed - No data to display  EKG None  Radiology Dg Knee Complete 4 Views Left  Result Date: 10/11/2017 CLINICAL DATA:  Pain following fall EXAM: LEFT KNEE - COMPLETE 4+ VIEW COMPARISON:  None. FINDINGS: Frontal, lateral, and bilateral oblique views were obtained. No evident fracture or dislocation. No joint effusion. There is mild spurring in each compartment. No appreciable joint space narrowing, however. No erosive change. IMPRESSION: Slight generalized osteoarthritic change. No fracture or joint effusion. Electronically Signed   By: Lowella Grip III M.D.   On: 10/11/2017 13:47    Procedures Procedures (including critical care time)  Medications Ordered in ED Medications  acetaminophen (TYLENOL) tablet 650 mg (has no  administration in time range)     Initial Impression / Assessment and Plan / ED Course  I have reviewed the triage vital signs and the nursing notes.  Pertinent labs & imaging results that were available during my care of the patient were reviewed by me and considered in my medical decision making (see chart for details).     Labs:   Imaging: DG knee complete 4 view left  Consults:  Therapeutics:  Discharge Meds:   Assessment/Plan: 71 show female presents status post fall. She has superficial abrasions noted to the anterior knee. Patient is able to ambulate in place all of her weight on her left leg. I have lower suspicion for acute internal derangement or occult fracture. Patient is requesting crutches, I counseled safe use of crutches that she is 39 although she is well-appearing active with  good muscular tone. Patient will be discharged with instructions to use Tylenol as needed for discomfort, wound care instructions given, tetanus is up-to-date, strict return precautions given and outpatient follow-up encouraged. Patient verbalized understanding and agreement to today's plan had no further questions or concerns.    Final Clinical Impressions(s) / ED Diagnoses   Final diagnoses:  Fall, initial encounter  Abrasion  Injury of left knee, initial encounter    ED Discharge Orders         Ordered    acetaminophen (TYLENOL) 325 MG tablet  Every 6 hours PRN     10/11/17 1425           Okey Regal, PA-C 10/11/17 1432    Julianne Rice, MD 10/11/17 1810

## 2017-11-26 ENCOUNTER — Other Ambulatory Visit: Payer: Self-pay | Admitting: Family Medicine

## 2017-11-26 DIAGNOSIS — F32A Depression, unspecified: Secondary | ICD-10-CM

## 2017-11-26 DIAGNOSIS — I10 Essential (primary) hypertension: Secondary | ICD-10-CM

## 2017-11-26 DIAGNOSIS — F419 Anxiety disorder, unspecified: Secondary | ICD-10-CM

## 2017-11-26 DIAGNOSIS — F329 Major depressive disorder, single episode, unspecified: Secondary | ICD-10-CM

## 2017-12-08 ENCOUNTER — Other Ambulatory Visit: Payer: Self-pay | Admitting: Family Medicine

## 2017-12-08 DIAGNOSIS — F329 Major depressive disorder, single episode, unspecified: Secondary | ICD-10-CM

## 2017-12-08 DIAGNOSIS — F419 Anxiety disorder, unspecified: Principal | ICD-10-CM

## 2017-12-08 DIAGNOSIS — F32A Depression, unspecified: Secondary | ICD-10-CM

## 2017-12-21 ENCOUNTER — Ambulatory Visit: Payer: Medicare HMO | Attending: Family Medicine | Admitting: Family Medicine

## 2017-12-21 ENCOUNTER — Encounter: Payer: Self-pay | Admitting: Family Medicine

## 2017-12-21 VITALS — BP 178/90 | HR 71 | Temp 98.4°F | Resp 16 | Wt 158.6 lb

## 2017-12-21 DIAGNOSIS — F329 Major depressive disorder, single episode, unspecified: Secondary | ICD-10-CM | POA: Diagnosis not present

## 2017-12-21 DIAGNOSIS — I10 Essential (primary) hypertension: Secondary | ICD-10-CM | POA: Diagnosis not present

## 2017-12-21 DIAGNOSIS — Z23 Encounter for immunization: Secondary | ICD-10-CM

## 2017-12-21 DIAGNOSIS — M25511 Pain in right shoulder: Secondary | ICD-10-CM

## 2017-12-21 DIAGNOSIS — F32A Depression, unspecified: Secondary | ICD-10-CM

## 2017-12-21 DIAGNOSIS — E7800 Pure hypercholesterolemia, unspecified: Secondary | ICD-10-CM | POA: Insufficient documentation

## 2017-12-21 DIAGNOSIS — M19042 Primary osteoarthritis, left hand: Secondary | ICD-10-CM

## 2017-12-21 DIAGNOSIS — Z780 Asymptomatic menopausal state: Secondary | ICD-10-CM | POA: Diagnosis not present

## 2017-12-21 DIAGNOSIS — M19041 Primary osteoarthritis, right hand: Secondary | ICD-10-CM | POA: Diagnosis not present

## 2017-12-21 DIAGNOSIS — F419 Anxiety disorder, unspecified: Secondary | ICD-10-CM | POA: Diagnosis not present

## 2017-12-21 DIAGNOSIS — E2839 Other primary ovarian failure: Secondary | ICD-10-CM | POA: Diagnosis not present

## 2017-12-21 DIAGNOSIS — Z1211 Encounter for screening for malignant neoplasm of colon: Secondary | ICD-10-CM

## 2017-12-21 DIAGNOSIS — Z79899 Other long term (current) drug therapy: Secondary | ICD-10-CM | POA: Diagnosis not present

## 2017-12-21 DIAGNOSIS — Z1239 Encounter for other screening for malignant neoplasm of breast: Secondary | ICD-10-CM

## 2017-12-21 DIAGNOSIS — B1920 Unspecified viral hepatitis C without hepatic coma: Secondary | ICD-10-CM | POA: Insufficient documentation

## 2017-12-21 MED ORDER — PREDNISONE 20 MG PO TABS: 20.0000 mg | ORAL_TABLET | Freq: Every day | ORAL | 0 refills | Status: DC

## 2017-12-21 MED ORDER — ESCITALOPRAM OXALATE 20 MG PO TABS: 20.0000 mg | ORAL_TABLET | Freq: Every day | ORAL | 1 refills | Status: DC

## 2017-12-21 MED ORDER — PROPRANOLOL HCL ER 80 MG PO CP24: 80.0000 mg | ORAL_CAPSULE | Freq: Every day | ORAL | 1 refills | Status: DC

## 2017-12-21 MED ORDER — MELOXICAM 7.5 MG PO TABS: 7.5000 mg | ORAL_TABLET | Freq: Every day | ORAL | 1 refills | Status: DC

## 2017-12-21 MED ORDER — ATORVASTATIN CALCIUM 20 MG PO TABS: 20.0000 mg | ORAL_TABLET | Freq: Every day | ORAL | 1 refills | Status: DC

## 2017-12-21 MED ORDER — LISINOPRIL 40 MG PO TABS: 40.0000 mg | ORAL_TABLET | Freq: Every day | ORAL | 1 refills | Status: DC

## 2017-12-21 MED ORDER — AMLODIPINE BESYLATE 10 MG PO TABS: 10.0000 mg | ORAL_TABLET | Freq: Every day | ORAL | 1 refills | Status: DC

## 2017-12-21 MED FILL — PROPRANOLOL ER 80 MG CAP: 80 | 90 days supply | Qty: 90 | Fill #0

## 2017-12-21 MED FILL — AMLODIPINE BESYLATE 10 MG T: 10 | 90 days supply | Qty: 90 | Fill #0

## 2017-12-21 MED FILL — MELOXICAM 7.5 MG TABLET: 7.5 | 30 days supply | Qty: 30 | Fill #0

## 2017-12-21 MED FILL — predniSONE 20 MG TABS: 20 | 5 days supply | Qty: 5 | Fill #0

## 2017-12-21 NOTE — Progress Notes (Signed)
Subjective:  Patient ID: [name], female    DOB: 05-Dec-1950  Age: 67 y.o. MRN: [id number]  CC: Medication Refill   HPI [name] is a 67 year old female with a history of hypertension, anxiety and depression, history of breast cancer (status post left mastectomy over 16 years ago) who presents today for follow-up visit. She complains of right shoulder pain at the site where her bra strap touches her skin, left wrist swelling and pain and nodule in her right fifth finger and feels like her fifth finger "is being pulled in". She denies history of trauma and has not taken any medications for her symptoms which she has noticed over the last couple of days. Her chart reveals she is allergic to aspirin but she informs me she is able to take ibuprofen. Anxiety and depression are controlled on her current regimen. Her blood pressure is elevated today and she endorses running out of her antihypertensives.  Past Medical History:  Diagnosis Date  . Breast cancer ([place]) 2004   left breast, Paget's disease, full involvement of nipple with invasive ductal carcinoma  . Hepatitis C 07/17/2011   Positive Hepatitis C Antibody test and then confirmed with Hepatitis C Virus RNA by PCR.  Marland Kitchen Hypercholesteremia    dx 2010  . Hypertension    dx 1996  . Leukopenia 07/10/2011   Stable compared to 2012    Past Surgical History:  Procedure Laterality Date  . ABDOMINAL HYSTERECTOMY     late 1990s.  Marland Kitchen BREAST SURGERY Left    2004  . MASTECTOMY  2005   left side    Allergies  Allergen Reactions  . Fish Allergy Shortness Of Breath  . Strawberry Extract Shortness Of Breath  . Aspirin Nausea Only     Outpatient Medications Prior to Visit  Medication Sig Dispense Refill  . acetaminophen (TYLENOL) 325 MG tablet Take 2 tablets (650 mg total) by mouth every 6 (six) hours as needed. 30 tablet 0  . cetirizine (ZYRTEC) 10 MG tablet Take 1 tablet (10 mg total) by mouth daily. 30 tablet 6  . Elastic  Bandages & Supports (WRIST SPLINT/ELASTIC LEFT MED) MISC 1 each by Does not apply route daily. 1 each 0  . Elastic Bandages & Supports (WRIST SPLINT/ELASTIC RIGHT MED) MISC 1 each by Does not apply route daily. 1 each 0  . LORazepam (ATIVAN) 0.5 MG tablet Take 1 tablet (0.5 mg total) by mouth every 8 (eight) hours as needed for anxiety or sleep. (Patient not taking: Reported on 06/01/2017) 30 tablet 0  . traZODone (DESYREL) 50 MG tablet TAKE 1 TABLET (50 MG TOTAL) BY MOUTH AT BEDTIME. MUST MAKE APPT FOR FURTHER REFILLS 30 tablet 0  . amLODipine (NORVASC) 10 MG tablet Take 1 tablet (10 mg total) by mouth daily. MUST MAKE APPT FOR FURTHER REFILLS 30 tablet 0  . atorvastatin (LIPITOR) 20 MG tablet TAKE 1 TABLET (20 MG TOTAL) BY MOUTH DAILY. 90 tablet 2  . escitalopram (LEXAPRO) 20 MG tablet TAKE 1 TABLET (20 MG TOTAL) BY MOUTH DAILY. 90 tablet 2  . lisinopril (PRINIVIL,ZESTRIL) 40 MG tablet TAKE 1 TABLET (40 MG TOTAL) BY MOUTH DAILY. MUST MAKE APPT FOR FURTHER REFILLS 30 tablet 0  . propranolol ER (INDERAL LA) 80 MG 24 hr capsule Take 1 capsule (80 mg total) by mouth at bedtime. MUST MAKE APPT FOR FURTHER REFILLS 30 capsule 0   No facility-administered medications prior to visit.     ROS Review of Systems  Constitutional:  Negative for activity change, appetite change and fatigue.  HENT: Negative for congestion, sinus pressure and sore throat.   Eyes: Negative for visual disturbance.  Respiratory: Negative for cough, chest tightness, shortness of breath and wheezing.   Cardiovascular: Negative for chest pain and palpitations.  Gastrointestinal: Negative for abdominal distention, abdominal pain and constipation.  Endocrine: Negative for polydipsia.  Genitourinary: Negative for dysuria and frequency.  Musculoskeletal:       See hpi  Skin: Negative for rash.  Neurological: Negative for tremors, light-headedness and numbness.  Hematological: Does not bruise/bleed easily.  Psychiatric/Behavioral:  Negative for agitation and behavioral problems.    Objective:  BP (!) 178/90   Pulse 71   Temp 98.4 F (36.9 C) (Oral)   Resp 16   Wt 158 lb 9.6 oz (71.9 kg)   SpO2 100%   BMI 28.09 kg/m   BP/Weight 12/21/2017 11/19/1005 03/03/1973  Systolic BP 883 254 982  Diastolic BP 90 89 76  Wt. (Lbs) 158.6 - 164  BMI 28.09 - 29.05      Physical Exam  Constitutional: She is oriented to person, place, and time. She appears well-developed and well-nourished.  Cardiovascular: Normal rate, normal heart sounds and intact distal pulses.  No murmur heard. Pulmonary/Chest: Effort normal and breath sounds normal. She has no wheezes. She has no rales. She exhibits no tenderness.  Abdominal: Soft. Bowel sounds are normal. She exhibits no distension and no mass. There is no tenderness.  Musculoskeletal: Normal range of motion.  TTP of superior aspect of R shoulder Abduction of R upper extremity limited to 90 degrees Bouchard's nodules on R 5th finger with slight flexion deformity TTP of ulnar aspect of L wrist, no edema.  Neurological: She is alert and oriented to person, place, and time.  Skin: Skin is warm and dry.  Psychiatric: She has a normal mood and affect.     Assessment & Plan:   1. Anxiety and depression Controlled - propranolol ER (INDERAL LA) 80 MG 24 hr capsule; Take 1 capsule (80 mg total) by mouth at bedtime.  Dispense: 90 capsule; Refill: 1 - escitalopram (LEXAPRO) 20 MG tablet; Take 1 tablet (20 mg total) by mouth daily.  Dispense: 90 tablet; Refill: 1  2. Essential hypertension Uncontrolled due to running out of medications which I have refilled - propranolol ER (INDERAL LA) 80 MG 24 hr capsule; Take 1 capsule (80 mg total) by mouth at bedtime.  Dispense: 90 capsule; Refill: 1 - amLODipine (NORVASC) 10 MG tablet; Take 1 tablet (10 mg total) by mouth daily.  Dispense: 90 tablet; Refill: 1 - lisinopril (PRINIVIL,ZESTRIL) 40 MG tablet; Take 1 tablet (40 mg total) by mouth  daily.  Dispense: 90 tablet; Refill: 1  3. Primary osteoarthritis of both hands This could explain nodules and diffuse arthralgias - predniSONE (DELTASONE) 20 MG tablet; Take 1 tablet (20 mg total) by mouth daily with breakfast.  Dispense: 5 tablet; Refill: 0 - meloxicam (MOBIC) 7.5 MG tablet; Take 1 tablet (7.5 mg total) by mouth daily.  Dispense: 30 tablet; Refill: 1  4. Acute pain of right shoulder Could be early osteoarthritis Placed on anti-inflammatory Apply ice - meloxicam (MOBIC) 7.5 MG tablet; Take 1 tablet (7.5 mg total) by mouth daily.  Dispense: 30 tablet; Refill: 1  5. Estrogen deficiency - DG Bone Density; Future  6. Screening for breast cancer - MM Digital Screening; Future  7. Screening for colon cancer - Ambulatory referral to Gastroenterology   Meds ordered this encounter  Medications  .  propranolol ER (INDERAL LA) 80 MG 24 hr capsule    Sig: Take 1 capsule (80 mg total) by mouth at bedtime.    Dispense:  90 capsule    Refill:  1  . amLODipine (NORVASC) 10 MG tablet    Sig: Take 1 tablet (10 mg total) by mouth daily.    Dispense:  90 tablet    Refill:  1  . atorvastatin (LIPITOR) 20 MG tablet    Sig: Take 1 tablet (20 mg total) by mouth daily.    Dispense:  90 tablet    Refill:  1  . escitalopram (LEXAPRO) 20 MG tablet    Sig: Take 1 tablet (20 mg total) by mouth daily.    Dispense:  90 tablet    Refill:  1  . lisinopril (PRINIVIL,ZESTRIL) 40 MG tablet    Sig: Take 1 tablet (40 mg total) by mouth daily.    Dispense:  90 tablet    Refill:  1  . predniSONE (DELTASONE) 20 MG tablet    Sig: Take 1 tablet (20 mg total) by mouth daily with breakfast.    Dispense:  5 tablet    Refill:  0  . meloxicam (MOBIC) 7.5 MG tablet    Sig: Take 1 tablet (7.5 mg total) by mouth daily.    Dispense:  30 tablet    Refill:  1    Follow-up: Return in about 3 months (around 03/23/2018) for follow up of chronic medical conditions.   Charlott Rakes MD

## 2017-12-21 NOTE — Progress Notes (Signed)
Pt states she has knot on the top of right shoulder where her bra strap goes  Pt states he left wrist is swollen

## 2017-12-21 NOTE — Patient Instructions (Signed)

## 2017-12-22 ENCOUNTER — Encounter: Payer: Self-pay | Admitting: Internal Medicine

## 2018-01-05 ENCOUNTER — Other Ambulatory Visit: Payer: Self-pay | Admitting: Family Medicine

## 2018-01-05 DIAGNOSIS — I10 Essential (primary) hypertension: Secondary | ICD-10-CM

## 2018-01-18 ENCOUNTER — Telehealth: Payer: Self-pay | Admitting: *Deleted

## 2018-01-18 NOTE — Telephone Encounter (Signed)
Pt no showed PV today at 9 am- I called pt at 930 am- got her voice mail- I left a message that she needed to call and RS her PV by 5 pm today and that if she did not, her colonoscopy would be cancelled along with her PV and she could call back and RS both-  Pt did not call and RS PV- I mailed her a No show letter as well-   PV and colon cancelled   [facility]

## 2018-01-31 ENCOUNTER — Encounter: Payer: Medicare HMO | Admitting: Internal Medicine

## 2018-03-28 ENCOUNTER — Encounter: Payer: Self-pay | Admitting: Family Medicine

## 2018-03-28 ENCOUNTER — Ambulatory Visit: Payer: Medicare HMO | Attending: Family Medicine | Admitting: Family Medicine

## 2018-03-28 VITALS — BP 150/72 | HR 58 | Temp 98.0°F | Ht 63.0 in | Wt 161.4 lb

## 2018-03-28 DIAGNOSIS — I10 Essential (primary) hypertension: Secondary | ICD-10-CM | POA: Diagnosis not present

## 2018-03-28 DIAGNOSIS — F329 Major depressive disorder, single episode, unspecified: Secondary | ICD-10-CM

## 2018-03-28 DIAGNOSIS — E7800 Pure hypercholesterolemia, unspecified: Secondary | ICD-10-CM | POA: Diagnosis not present

## 2018-03-28 DIAGNOSIS — F32A Depression, unspecified: Secondary | ICD-10-CM

## 2018-03-28 DIAGNOSIS — F419 Anxiety disorder, unspecified: Secondary | ICD-10-CM | POA: Diagnosis not present

## 2018-03-28 MED ORDER — TRAZODONE HCL 50 MG PO TABS: 50.0000 mg | ORAL_TABLET | Freq: Every day | ORAL | 3 refills | Status: DC

## 2018-03-28 MED ORDER — HYDROXYZINE HCL 25 MG PO TABS: 25.0000 mg | ORAL_TABLET | Freq: Three times a day (TID) | ORAL | 3 refills | Status: DC | PRN

## 2018-03-28 MED ORDER — ISOSORBIDE MONONITRATE ER 30 MG PO TB24: 30.0000 mg | ORAL_TABLET | Freq: Every day | ORAL | 6 refills | Status: DC

## 2018-03-28 MED FILL — hydrOXYzine HCL 25 MG TABS: 25 | 30 days supply | Qty: 90 | Fill #0

## 2018-03-28 MED FILL — ISOSORBIDE MN ER 30 MG TAB: 30 | 30 days supply | Qty: 30 | Fill #0

## 2018-03-28 MED FILL — traZODone HCL 50 MG TABS: 50 | 30 days supply | Qty: 30 | Fill #0

## 2018-03-28 NOTE — Patient Instructions (Signed)

## 2018-03-28 NOTE — Progress Notes (Signed)
Subjective:  Patient ID: [name], female    DOB: 08-Dec-1950  Age: 68 y.o. MRN: [id number]  CC: Hypertension   HPI RAFAELLA KOLE  is a 68 year old female with a history of hypertension, anxiety and depression, history of breast cancer (status post left mastectomy over 16 years ago) who presents today for follow-up visit. She complains of worsening symptoms of anxiety and depression as she has had a couple of deaths in her apartment which has caused her to be uninterested in activities and a low motivation but she denies suicidal ideations or intent. Her blood pressure is elevated and she endorses compliance with her medications. She tolerates her statin with no complaints of myalgias. Past Medical History:  Diagnosis Date  . Breast cancer ([place]) 2004   left breast, Paget's disease, full involvement of nipple with invasive ductal carcinoma  . Hepatitis C 07/17/2011   Positive Hepatitis C Antibody test and then confirmed with Hepatitis C Virus RNA by PCR.  Marland Kitchen Hypercholesteremia    dx 2010  . Hypertension    dx 1996  . Leukopenia 07/10/2011   Stable compared to 2012    Past Surgical History:  Procedure Laterality Date  . ABDOMINAL HYSTERECTOMY     late 1990s.  Marland Kitchen BREAST SURGERY Left    2004  . MASTECTOMY  2005   left side    Allergies  Allergen Reactions  . Fish Allergy Shortness Of Breath  . Strawberry Extract Shortness Of Breath  . Aspirin Nausea Only      Outpatient Medications Prior to Visit  Medication Sig Dispense Refill  . acetaminophen (TYLENOL) 325 MG tablet Take 2 tablets (650 mg total) by mouth every 6 (six) hours as needed. 30 tablet 0  . amLODipine (NORVASC) 10 MG tablet Take 1 tablet (10 mg total) by mouth daily. 90 tablet 1  . atorvastatin (LIPITOR) 20 MG tablet Take 1 tablet (20 mg total) by mouth daily. 90 tablet 1  . cetirizine (ZYRTEC) 10 MG tablet Take 1 tablet (10 mg total) by mouth daily. 30 tablet 6  . Elastic Bandages & Supports (WRIST  SPLINT/ELASTIC LEFT MED) MISC 1 each by Does not apply route daily. 1 each 0  . Elastic Bandages & Supports (WRIST SPLINT/ELASTIC RIGHT MED) MISC 1 each by Does not apply route daily. 1 each 0  . escitalopram (LEXAPRO) 20 MG tablet Take 1 tablet (20 mg total) by mouth daily. 90 tablet 1  . lisinopril (PRINIVIL,ZESTRIL) 40 MG tablet Take 1 tablet (40 mg total) by mouth daily. 30 tablet 2  . LORazepam (ATIVAN) 0.5 MG tablet Take 1 tablet (0.5 mg total) by mouth every 8 (eight) hours as needed for anxiety or sleep. 30 tablet 0  . propranolol ER (INDERAL LA) 80 MG 24 hr capsule Take 1 capsule (80 mg total) by mouth at bedtime. 90 capsule 1  . meloxicam (MOBIC) 7.5 MG tablet Take 1 tablet (7.5 mg total) by mouth daily. (Patient not taking: Reported on 03/28/2018) 30 tablet 1  . predniSONE (DELTASONE) 20 MG tablet Take 1 tablet (20 mg total) by mouth daily with breakfast. (Patient not taking: Reported on 03/28/2018) 5 tablet 0  . traZODone (DESYREL) 50 MG tablet TAKE 1 TABLET (50 MG TOTAL) BY MOUTH AT BEDTIME. MUST MAKE APPT FOR FURTHER REFILLS 30 tablet 0   No facility-administered medications prior to visit.     ROS Review of Systems  Constitutional: Negative for activity change, appetite change and fatigue.  HENT: Negative for congestion, sinus  pressure and sore throat.   Eyes: Negative for visual disturbance.  Respiratory: Negative for cough, chest tightness, shortness of breath and wheezing.   Cardiovascular: Negative for chest pain and palpitations.  Gastrointestinal: Negative for abdominal distention, abdominal pain and constipation.  Endocrine: Negative for polydipsia.  Genitourinary: Negative for dysuria and frequency.  Musculoskeletal: Negative for arthralgias and back pain.  Skin: Negative for rash.  Neurological: Negative for tremors, light-headedness and numbness.  Hematological: Does not bruise/bleed easily.  Psychiatric/Behavioral: Negative for agitation and behavioral problems.     Objective:  BP (!) 150/72   Pulse (!) 58   Temp 98 F (36.7 C) (Oral)   Ht _0  (1.6 m)   Wt 161 lb 6.4 oz (73.2 kg)   SpO2 99%   BMI 28.59 kg/m   BP/Weight 03/28/2018 12/21/2017 3/71/0626  Systolic BP 948 546 270  Diastolic BP 72 90 89  Wt. (Lbs) 161.4 158.6 -  BMI 28.59 28.09 -      Physical Exam Constitutional:      Appearance: She is well-developed.  Cardiovascular:     Rate and Rhythm: Normal rate.     Heart sounds: Normal heart sounds. No murmur.  Pulmonary:     Effort: Pulmonary effort is normal.     Breath sounds: Normal breath sounds. No wheezing or rales.  Chest:     Chest wall: No tenderness.  Abdominal:     General: Bowel sounds are normal. There is no distension.     Palpations: Abdomen is soft. There is no mass.     Tenderness: There is no abdominal tenderness.  Musculoskeletal: Normal range of motion.  Neurological:     Mental Status: She is alert and oriented to person, place, and time.  Psychiatric:        Mood and Affect: Mood normal.      Assessment & Plan:   1. Anxiety and depression Uncontrolled due to recent bereavement We will add hydroxyzine to her regimen She will benefit from grief counseling - traZODone (DESYREL) 50 MG tablet; Take 1 tablet (50 mg total) by mouth at bedtime.  Dispense: 30 tablet; Refill: 3 - hydrOXYzine (ATARAX/VISTARIL) 25 MG tablet; Take 1 tablet (25 mg total) by mouth 3 (three) times daily as needed.  Dispense: 90 tablet; Refill: 3  2. Essential hypertension Uncontrolled Isosorbide added to regimen Low-sodium, DASH diet - CMP14+EGFR - isosorbide mononitrate (IMDUR) 30 MG 24 hr tablet; Take 1 tablet (30 mg total) by mouth daily.  Dispense: 30 tablet; Refill: 6  3. Hypercholesteremia Controlled - Lipid panel   Meds ordered this encounter  Medications  . traZODone (DESYREL) 50 MG tablet    Sig: Take 1 tablet (50 mg total) by mouth at bedtime.    Dispense:  30 tablet    Refill:  3  . hydrOXYzine  (ATARAX/VISTARIL) 25 MG tablet    Sig: Take 1 tablet (25 mg total) by mouth 3 (three) times daily as needed.    Dispense:  90 tablet    Refill:  3  . isosorbide mononitrate (IMDUR) 30 MG 24 hr tablet    Sig: Take 1 tablet (30 mg total) by mouth daily.    Dispense:  30 tablet    Refill:  6    Follow-up: Return in about 3 months (around 06/27/2018) for Follow-up of chronic medical conditions.   Charlott Rakes MD

## 2018-03-29 ENCOUNTER — Encounter: Payer: Self-pay | Admitting: Family Medicine

## 2018-03-29 LAB — CMP14+EGFR
ALBUMIN: 4.7 g/dL (ref 3.8–4.8)
ALK PHOS: 82 IU/L (ref 39–117)
ALT: 41 IU/L — ABNORMAL HIGH (ref 0–32)
AST: 45 IU/L — ABNORMAL HIGH (ref 0–40)
Albumin/Globulin Ratio: 1.9 (ref 1.2–2.2)
BUN / CREAT RATIO: 17 (ref 12–28)
BUN: 17 mg/dL (ref 8–27)
Bilirubin Total: 0.7 mg/dL (ref 0.0–1.2)
CALCIUM: 9.9 mg/dL (ref 8.7–10.3)
CO2: 23 mmol/L (ref 20–29)
CREATININE: 0.98 mg/dL (ref 0.57–1.00)
Chloride: 102 mmol/L (ref 96–106)
GFR calc Af Amer: 69 mL/min/{1.73_m2} (ref >59)
GFR, EST NON AFRICAN AMERICAN: 60 mL/min/{1.73_m2} (ref >59)
GLOBULIN, TOTAL: 2.5 g/dL (ref 1.5–4.5)
GLUCOSE: 99 mg/dL (ref 65–99)
Potassium: 4.4 mmol/L (ref 3.5–5.2)
Sodium: 141 mmol/L (ref 134–144)
Total Protein: 7.2 g/dL (ref 6.0–8.5)

## 2018-03-29 LAB — LIPID PANEL
CHOLESTEROL TOTAL: 198 mg/dL (ref 100–199)
Chol/HDL Ratio: 2.3 ratio (ref 0.0–4.4)
HDL: 87 mg/dL (ref >39)
LDL Calculated: 98 mg/dL (ref 0–99)
TRIGLYCERIDES: 65 mg/dL (ref 0–149)
VLDL CHOLESTEROL CAL: 13 mg/dL (ref 5–40)

## 2018-03-30 ENCOUNTER — Other Ambulatory Visit: Payer: Self-pay | Admitting: Family Medicine

## 2018-03-30 DIAGNOSIS — F419 Anxiety disorder, unspecified: Principal | ICD-10-CM

## 2018-03-30 DIAGNOSIS — F32A Depression, unspecified: Secondary | ICD-10-CM

## 2018-03-30 DIAGNOSIS — F329 Major depressive disorder, single episode, unspecified: Secondary | ICD-10-CM

## 2018-04-04 ENCOUNTER — Telehealth: Payer: Self-pay

## 2018-04-04 NOTE — Telephone Encounter (Signed)
-----   Message from Charlott Rakes, MD sent at 03/29/2018  8:28 AM EST ----- Please inform the patient her labs are stable.

## 2018-04-04 NOTE — Telephone Encounter (Signed)
Patient was called and informed of lab results. 

## 2018-04-20 MED FILL — PROPRANOLOL ER 80 MG CAP: 80 | 90 days supply | Qty: 90 | Fill #1

## 2018-04-20 MED FILL — AMLODIPINE BESYLATE 10 MG T: 10 | 30 days supply | Qty: 30 | Fill #1

## 2018-06-03 MED FILL — traZODone HCL 50 MG TABS: 50 | 30 days supply | Qty: 30 | Fill #1

## 2018-06-03 MED FILL — ESCITALOPRAM 20 MG TABLET: 20 | 90 days supply | Qty: 90 | Fill #0

## 2018-06-03 MED FILL — LISINOPRIL 40 MG TABLET: 40 | 90 days supply | Qty: 90 | Fill #0

## 2018-06-03 MED FILL — hydrOXYzine HCL 25 MG TABS: 25 | 30 days supply | Qty: 90 | Fill #1

## 2018-06-03 MED FILL — AMLODIPINE BESYLATE 10 MG T: 10 | 30 days supply | Qty: 30 | Fill #2

## 2018-06-03 MED FILL — ISOSORBIDE MN ER 30 MG TAB: 30 | 30 days supply | Qty: 30 | Fill #1

## 2018-07-11 MED FILL — ISOSORBIDE MN ER 30 MG TAB: 30 | 30 days supply | Qty: 30 | Fill #2

## 2018-07-11 MED FILL — traZODone HCL 50 MG TABS: 50 | 30 days supply | Qty: 30 | Fill #2

## 2018-07-11 MED FILL — AMLODIPINE BESYLATE 10 MG T: 10 | 30 days supply | Qty: 30 | Fill #3

## 2018-08-25 ENCOUNTER — Other Ambulatory Visit: Payer: Self-pay | Admitting: Family Medicine

## 2018-08-25 DIAGNOSIS — I10 Essential (primary) hypertension: Secondary | ICD-10-CM

## 2018-08-25 MED FILL — traZODone HCL 50 MG TABS: 50 | 30 days supply | Qty: 30 | Fill #3

## 2018-08-25 MED FILL — hydrOXYzine HCL 25 MG TABS: 25 | 30 days supply | Qty: 90 | Fill #2

## 2018-08-25 MED FILL — AMLODIPINE BESYLATE 10 MG T: 10 | 30 days supply | Qty: 30 | Fill #0

## 2018-09-13 DIAGNOSIS — H2513 Age-related nuclear cataract, bilateral: Secondary | ICD-10-CM | POA: Diagnosis not present

## 2018-09-13 DIAGNOSIS — H40013 Open angle with borderline findings, low risk, bilateral: Secondary | ICD-10-CM | POA: Diagnosis not present

## 2018-09-13 MED FILL — KETOROLAC 0.4% OPHTH SOLN: 0.4 | 19 days supply | Qty: 5 | Fill #0

## 2018-09-13 MED FILL — PREDNISOLONE AC 1% EYE DROP: 1 | 38 days supply | Qty: 10 | Fill #0

## 2018-09-13 MED FILL — OFLOXACIN 0.3% EYE DROPS: 0.3 | 19 days supply | Qty: 5 | Fill #0

## 2018-09-20 MED FILL — ISOSORBIDE MN ER 30 MG TAB: 30 | 30 days supply | Qty: 30 | Fill #3

## 2018-09-28 DIAGNOSIS — F325 Major depressive disorder, single episode, in full remission: Secondary | ICD-10-CM | POA: Diagnosis not present

## 2018-09-28 DIAGNOSIS — Z9012 Acquired absence of left breast and nipple: Secondary | ICD-10-CM | POA: Diagnosis not present

## 2018-09-28 DIAGNOSIS — F418 Other specified anxiety disorders: Secondary | ICD-10-CM | POA: Diagnosis not present

## 2018-09-28 DIAGNOSIS — E785 Hyperlipidemia, unspecified: Secondary | ICD-10-CM | POA: Diagnosis not present

## 2018-09-28 DIAGNOSIS — I10 Essential (primary) hypertension: Secondary | ICD-10-CM | POA: Diagnosis not present

## 2018-09-28 DIAGNOSIS — G4700 Insomnia, unspecified: Secondary | ICD-10-CM | POA: Diagnosis not present

## 2018-09-28 DIAGNOSIS — H547 Unspecified visual loss: Secondary | ICD-10-CM | POA: Diagnosis not present

## 2018-09-28 DIAGNOSIS — Z853 Personal history of malignant neoplasm of breast: Secondary | ICD-10-CM | POA: Diagnosis not present

## 2018-09-28 DIAGNOSIS — Z6823 Body mass index (BMI) 23.0-23.9, adult: Secondary | ICD-10-CM | POA: Diagnosis not present

## 2018-09-29 DIAGNOSIS — H2512 Age-related nuclear cataract, left eye: Secondary | ICD-10-CM | POA: Diagnosis not present

## 2018-10-12 ENCOUNTER — Other Ambulatory Visit: Payer: Self-pay | Admitting: Family Medicine

## 2018-10-12 DIAGNOSIS — F419 Anxiety disorder, unspecified: Secondary | ICD-10-CM

## 2018-10-12 DIAGNOSIS — F32A Depression, unspecified: Secondary | ICD-10-CM

## 2018-10-12 DIAGNOSIS — F329 Major depressive disorder, single episode, unspecified: Secondary | ICD-10-CM

## 2018-10-13 DIAGNOSIS — H2511 Age-related nuclear cataract, right eye: Secondary | ICD-10-CM | POA: Diagnosis not present

## 2018-10-27 ENCOUNTER — Other Ambulatory Visit: Payer: Self-pay | Admitting: Family Medicine

## 2018-10-27 DIAGNOSIS — F329 Major depressive disorder, single episode, unspecified: Secondary | ICD-10-CM

## 2018-10-27 DIAGNOSIS — F32A Depression, unspecified: Secondary | ICD-10-CM

## 2018-10-27 DIAGNOSIS — I10 Essential (primary) hypertension: Secondary | ICD-10-CM

## 2018-10-27 MED FILL — ISOSORBIDE MN ER 30 MG TAB: 30 | 30 days supply | Qty: 30 | Fill #4

## 2018-10-27 MED FILL — AMLODIPINE BESYLATE 10 MG T: 10 | 30 days supply | Qty: 30 | Fill #0

## 2018-10-27 MED FILL — LISINOPRIL 40 MG TABLET: 40 | 90 days supply | Qty: 90 | Fill #1

## 2018-10-27 MED FILL — traZODone HCL 50 MG TABS: 50 | 30 days supply | Qty: 30 | Fill #0

## 2018-11-08 ENCOUNTER — Other Ambulatory Visit: Payer: Self-pay

## 2018-11-08 ENCOUNTER — Encounter: Payer: Self-pay | Admitting: Family Medicine

## 2018-11-08 ENCOUNTER — Ambulatory Visit: Payer: Medicare PPO | Attending: Family Medicine | Admitting: Family Medicine

## 2018-11-08 DIAGNOSIS — M19042 Primary osteoarthritis, left hand: Secondary | ICD-10-CM | POA: Diagnosis not present

## 2018-11-08 DIAGNOSIS — F329 Major depressive disorder, single episode, unspecified: Secondary | ICD-10-CM | POA: Diagnosis not present

## 2018-11-08 DIAGNOSIS — F419 Anxiety disorder, unspecified: Secondary | ICD-10-CM

## 2018-11-08 DIAGNOSIS — I10 Essential (primary) hypertension: Secondary | ICD-10-CM | POA: Diagnosis not present

## 2018-11-08 DIAGNOSIS — F32A Depression, unspecified: Secondary | ICD-10-CM

## 2018-11-08 DIAGNOSIS — M19041 Primary osteoarthritis, right hand: Secondary | ICD-10-CM | POA: Diagnosis not present

## 2018-11-08 MED ORDER — ESCITALOPRAM OXALATE 20 MG PO TABS
20.0000 mg | ORAL_TABLET | Freq: Every day | ORAL | 1 refills | Status: DC
Start: 2018-11-08 — End: 2019-07-05

## 2018-11-08 MED ORDER — MELOXICAM 7.5 MG PO TABS: 7.5000 mg | ORAL_TABLET | Freq: Every day | ORAL | 1 refills | Status: DC

## 2018-11-08 MED ORDER — PROPRANOLOL HCL ER 80 MG PO CP24: 80.0000 mg | ORAL_CAPSULE | Freq: Every day | ORAL | 1 refills | Status: DC

## 2018-11-08 MED ORDER — ISOSORBIDE MONONITRATE ER 30 MG PO TB24: 30.0000 mg | ORAL_TABLET | Freq: Every day | ORAL | 6 refills | Status: DC

## 2018-11-08 MED ORDER — AMLODIPINE BESYLATE 10 MG PO TABS: 10.0000 mg | ORAL_TABLET | Freq: Every day | ORAL | 1 refills | Status: DC

## 2018-11-08 MED ORDER — TRAZODONE HCL 50 MG PO TABS: 50.0000 mg | ORAL_TABLET | Freq: Every day | ORAL | 1 refills | Status: DC

## 2018-11-08 MED ORDER — HYDROXYZINE HCL 25 MG PO TABS: 25.0000 mg | ORAL_TABLET | Freq: Three times a day (TID) | ORAL | 3 refills | Status: DC | PRN

## 2018-11-08 MED ORDER — ATORVASTATIN CALCIUM 20 MG PO TABS: 20.0000 mg | ORAL_TABLET | Freq: Every day | ORAL | 1 refills | Status: DC

## 2018-11-08 MED ORDER — PREDNISONE 20 MG PO TABS: 20.0000 mg | ORAL_TABLET | Freq: Every day | ORAL | 0 refills | Status: DC

## 2018-11-08 MED ORDER — LISINOPRIL 40 MG PO TABS: 40.0000 mg | ORAL_TABLET | Freq: Every day | ORAL | 1 refills | Status: DC

## 2018-11-08 NOTE — Progress Notes (Signed)
Patient has been called and DOB has been verified. Patient has been screened and transferred to PCP to start phone visit.     

## 2018-11-08 NOTE — Progress Notes (Signed)
Virtual Visit via Telephone Note  I connected with [name], on 11/08/2018 at 2:26 PM by telephone due to the COVID-19 pandemic and verified that I am speaking with the correct person using two identifiers.   Consent: I discussed the limitations, risks, security and privacy concerns of performing an evaluation and management service by telephone and the availability of in person appointments. I also discussed with the patient that there may be a patient responsible charge related to this service. The patient expressed understanding and agreed to proceed.   Location of Patient: Home  Location of Provider: Clinic   Persons participating in Telemedicine visit: Dyanne Carrel Farrington-CMA Dr. Felecia Shelling     History of Present Illness: [name]  is a 68 -year-old female with a history of hypertension, anxiety and depression, history of breast cancer (status post left mastectomy over 16 years ago) who presents today for a follow-up visit.  She complains of Arthritis in her hands and has been dropping things; this is chronic.  She was previously prescribed meloxicam which she has run out of at this time.  Pain is worse in the morning and she sometimes has associated stiffness and swelling of her knuckles. Compliant with her antihypertensive and is needing refills today. Doing well on her SSRI and hydroxyzine for anxiety.  Also compliant with Lipitor with no complaints of adverse effects.  Past Medical History:  Diagnosis Date  . Breast cancer ([place]) 2004   left breast, Paget's disease, full involvement of nipple with invasive ductal carcinoma  . Hepatitis C 07/17/2011   Positive Hepatitis C Antibody test and then confirmed with Hepatitis C Virus RNA by PCR.  Marland Kitchen Hypercholesteremia    dx 2010  . Hypertension    dx 1996  . Leukopenia 07/10/2011   Stable compared to 2012   Allergies  Allergen Reactions  . Fish Allergy Shortness Of Breath  . Strawberry Extract Shortness  Of Breath  . Aspirin Nausea Only    Current Outpatient Medications on File Prior to Visit  Medication Sig Dispense Refill  . acetaminophen (TYLENOL) 325 MG tablet Take 2 tablets (650 mg total) by mouth every 6 (six) hours as needed. 30 tablet 0  . amLODipine (NORVASC) 10 MG tablet Take 1 tablet (10 mg total) by mouth daily. Must keep upcoming office visit for refills 30 tablet 0  . atorvastatin (LIPITOR) 20 MG tablet Take 1 tablet (20 mg total) by mouth daily. 90 tablet 1  . cetirizine (ZYRTEC) 10 MG tablet Take 1 tablet (10 mg total) by mouth daily. 30 tablet 6  . Elastic Bandages & Supports (WRIST SPLINT/ELASTIC LEFT MED) MISC 1 each by Does not apply route daily. 1 each 0  . Elastic Bandages & Supports (WRIST SPLINT/ELASTIC RIGHT MED) MISC 1 each by Does not apply route daily. 1 each 0  . escitalopram (LEXAPRO) 20 MG tablet TAKE 1 TABLET (20 MG TOTAL) BY MOUTH DAILY. 90 tablet 0  . hydrOXYzine (ATARAX/VISTARIL) 25 MG tablet Take 1 tablet (25 mg total) by mouth 3 (three) times daily as needed. 90 tablet 3  . isosorbide mononitrate (IMDUR) 30 MG 24 hr tablet Take 1 tablet (30 mg total) by mouth daily. 30 tablet 6  . lisinopril (PRINIVIL,ZESTRIL) 40 MG tablet Take 1 tablet (40 mg total) by mouth daily. 30 tablet 2  . LORazepam (ATIVAN) 0.5 MG tablet Take 1 tablet (0.5 mg total) by mouth every 8 (eight) hours as needed for anxiety or sleep. 30 tablet 0  .  propranolol ER (INDERAL LA) 80 MG 24 hr capsule Take 1 capsule (80 mg total) by mouth at bedtime. 90 capsule 1  . traZODone (DESYREL) 50 MG tablet Take 1 tablet (50 mg total) by mouth at bedtime. Must keep upcoming office visit for refills 30 tablet 0  . meloxicam (MOBIC) 7.5 MG tablet Take 1 tablet (7.5 mg total) by mouth daily. (Patient not taking: Reported on 03/28/2018) 30 tablet 1  . predniSONE (DELTASONE) 20 MG tablet Take 1 tablet (20 mg total) by mouth daily with breakfast. (Patient not taking: Reported on 03/28/2018) 5 tablet 0   No  current facility-administered medications on file prior to visit.     Observations/Objective: Awake, alert, oriented x3 Not in acute distress  Assessment and Plan: 1. Primary osteoarthritis of both hands Uncontrolled due to running out of NSAID which has been refilled - predniSONE (DELTASONE) 20 MG tablet; Take 1 tablet (20 mg total) by mouth daily with breakfast.  Dispense: 5 tablet; Refill: 0 - meloxicam (MOBIC) 7.5 MG tablet; Take 1 tablet (7.5 mg total) by mouth daily.  Dispense: 30 tablet; Refill: 1  2. Anxiety and depression Controlled - propranolol ER (INDERAL LA) 80 MG 24 hr capsule; Take 1 capsule (80 mg total) by mouth at bedtime.  Dispense: 90 capsule; Refill: 1 - escitalopram (LEXAPRO) 20 MG tablet; Take 1 tablet (20 mg total) by mouth daily.  Dispense: 90 tablet; Refill: 1 - hydrOXYzine (ATARAX/VISTARIL) 25 MG tablet; Take 1 tablet (25 mg total) by mouth 3 (three) times daily as needed.  Dispense: 90 tablet; Refill: 3 - traZODone (DESYREL) 50 MG tablet; Take 1 tablet (50 mg total) by mouth at bedtime.  Dispense: 90 tablet; Refill: 1  3. Essential hypertension Controlled Counseled on blood pressure goal of less than 130/80, low-sodium, DASH diet, medication compliance, 150 minutes of moderate intensity exercise per week. Discussed medication compliance, adverse effects. - propranolol ER (INDERAL LA) 80 MG 24 hr capsule; Take 1 capsule (80 mg total) by mouth at bedtime.  Dispense: 90 capsule; Refill: 1 - isosorbide mononitrate (IMDUR) 30 MG 24 hr tablet; Take 1 tablet (30 mg total) by mouth daily.  Dispense: 30 tablet; Refill: 6 - amLODipine (NORVASC) 10 MG tablet; Take 1 tablet (10 mg total) by mouth daily.  Dispense: 90 tablet; Refill: 1 - lisinopril (ZESTRIL) 40 MG tablet; Take 1 tablet (40 mg total) by mouth daily.  Dispense: 90 tablet; Refill: 1    Follow Up Instructions: 6 months   I discussed the assessment and treatment plan with the patient. The patient was  provided an opportunity to ask questions and all were answered. The patient agreed with the plan and demonstrated an understanding of the instructions.   The patient was advised to call back or seek an in-person evaluation if the symptoms worsen or if the condition fails to improve as anticipated.     I provided 15 minutes total of non-face-to-face time during this encounter including median intraservice time, reviewing previous notes, labs, imaging, medications, management and patient verbalized understanding.     Charlott Rakes, MD, FAAFP. [facility] and [facility], [place]   11/08/2018, 2:26 PM

## 2018-11-09 ENCOUNTER — Ambulatory Visit: Payer: Medicare PPO | Attending: Family Medicine | Admitting: Pharmacist

## 2018-11-09 ENCOUNTER — Other Ambulatory Visit: Payer: Self-pay

## 2018-11-09 DIAGNOSIS — Z23 Encounter for immunization: Secondary | ICD-10-CM | POA: Diagnosis not present

## 2018-11-09 MED FILL — PROPRANOLOL ER 80 MG CAP: 80 | 90 days supply | Qty: 90 | Fill #0

## 2018-11-09 MED FILL — hydrOXYzine HCL 25 MG TABS: 25 | 30 days supply | Qty: 90 | Fill #0

## 2018-11-09 MED FILL — MELOXICAM 7.5 MG TABLET: 7.5 | 30 days supply | Qty: 30 | Fill #0

## 2018-11-09 MED FILL — predniSONE 20 MG TABS: 20 | 5 days supply | Qty: 5 | Fill #0

## 2018-11-09 MED FILL — ESCITALOPRAM 20 MG TABLET: 20 | 90 days supply | Qty: 90 | Fill #0

## 2018-11-09 MED FILL — ATORVASTATIN 20 MG TABLET: 20 | 90 days supply | Qty: 90 | Fill #0

## 2018-11-09 NOTE — Progress Notes (Signed)
Patient presents for vaccination against influenza per orders of Dr. Newlin. Consent given. Counseling provided. No contraindications exists. Vaccine administered without incident.   

## 2018-11-21 ENCOUNTER — Telehealth: Payer: Self-pay | Admitting: Family Medicine

## 2018-11-21 NOTE — Telephone Encounter (Signed)
New Message   Pt states she was prescribed some arthritis  medication and it is causing her fingers to tingle and burn. Patient want to know if that is suppose to happen. Please f/u

## 2018-11-21 NOTE — Telephone Encounter (Signed)
Patient was called and a voicemail was left informing patient to return phone call. Needing to know which medication is giving her these symptoms.

## 2018-11-23 DIAGNOSIS — Z961 Presence of intraocular lens: Secondary | ICD-10-CM | POA: Diagnosis not present

## 2018-12-15 MED FILL — ISOSORBIDE MN ER 30 MG TAB: 30 | 30 days supply | Qty: 30 | Fill #0

## 2018-12-15 MED FILL — AMLODIPINE BESYLATE 10 MG T: 10 | 90 days supply | Qty: 90 | Fill #0

## 2018-12-15 MED FILL — traZODone HCL 50 MG TABS: 50 | 90 days supply | Qty: 90 | Fill #0

## 2018-12-27 ENCOUNTER — Telehealth: Payer: Self-pay | Admitting: Family Medicine

## 2018-12-27 NOTE — Telephone Encounter (Signed)
Ulice Dash with [facility] called to see if a prescription was received for back knee right shoulder support. Please follow up.

## 2018-12-30 NOTE — Telephone Encounter (Signed)
Paperwork has been filled out and faxed over to company.

## 2018-12-30 NOTE — Telephone Encounter (Signed)
[name] with [facility] called to see if a prescription was received for back knee right shoulder support. Please follow up.

## 2019-04-14 MED FILL — ATORVASTATIN CALCIUM 20 MG: 20 | 90 days supply | Qty: 90 | Fill #1

## 2019-04-14 MED FILL — PROPRANOLOL ER 80 MG CAP: 80 | 90 days supply | Qty: 90 | Fill #1

## 2019-04-14 MED FILL — ISOSORBIDE MN ER 30 MG TAB: 30 | 90 days supply | Qty: 90 | Fill #1

## 2019-04-14 MED FILL — LISINOPRIL 40 MG TABLET: 40 | 90 days supply | Qty: 90 | Fill #0

## 2019-04-14 MED FILL — ESCITALOPRAM 20 MG TABLET: 20 | 90 days supply | Qty: 90 | Fill #1

## 2019-04-14 MED FILL — AMLODIPINE BESYLATE 10 MG T: 10 | 90 days supply | Qty: 90 | Fill #1

## 2019-04-14 MED FILL — traZODone HCL 50 MG TABS: 50 | 90 days supply | Qty: 90 | Fill #1

## 2019-04-14 MED FILL — hydrOXYzine HCL 25 MG TABS: 25 | 30 days supply | Qty: 90 | Fill #1

## 2019-04-18 ENCOUNTER — Ambulatory Visit: Payer: Medicare Other | Attending: Family Medicine | Admitting: Family Medicine

## 2019-04-18 ENCOUNTER — Other Ambulatory Visit: Payer: Self-pay

## 2019-04-18 ENCOUNTER — Encounter: Payer: Self-pay | Admitting: Family Medicine

## 2019-04-18 VITALS — BP 163/76 | HR 89 | Ht 63.0 in | Wt 164.0 lb

## 2019-04-18 DIAGNOSIS — Z0000 Encounter for general adult medical examination without abnormal findings: Secondary | ICD-10-CM | POA: Diagnosis not present

## 2019-04-18 DIAGNOSIS — E2839 Other primary ovarian failure: Secondary | ICD-10-CM

## 2019-04-18 DIAGNOSIS — Z23 Encounter for immunization: Secondary | ICD-10-CM

## 2019-04-18 DIAGNOSIS — Z1231 Encounter for screening mammogram for malignant neoplasm of breast: Secondary | ICD-10-CM

## 2019-04-18 DIAGNOSIS — Z1211 Encounter for screening for malignant neoplasm of colon: Secondary | ICD-10-CM

## 2019-04-18 NOTE — Progress Notes (Signed)
Subjective:   [name] is a 69 y.o. female who presents for Medicare Annual (Subsequent) preventive examination. 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  Review of Systems:  General: negative for fever, weight loss, appetite change Eyes: no visual symptoms. ENT: no ear symptoms, no sinus tenderness, no nasal congestion or sore throat. Neck: no pain  Respiratory: no wheezing, shortness of breath, cough Cardiovascular: no chest pain, no dyspnea on exertion, no pedal edema, no orthopnea. Gastrointestinal: no abdominal pain, no diarrhea, no constipation Genito-Urinary: no urinary frequency, no dysuria, no polyuria. Hematologic: no bruising Endocrine: no cold or heat intolerance Neurological: no headaches, no seizures, no tremors Musculoskeletal: no joint pains, no joint swelling Skin: no pruritus, no rash. Psychological: no depression, no anxiety,          Objective:     Vitals: BP (!) 163/76   Pulse 89   Ht 5\' 3"  (1.6 m)   Wt 164 lb (74.4 kg)   SpO2 100%   BMI 29.05 kg/m   Body mass index is 29.05 kg/m.  Advanced Directives 04/18/2019 10/11/2017 12/17/2016 10/21/2015 09/09/2015 04/19/2015 04/19/2015  Does Patient Have a Medical Advance Directive? No No No No No No No  Would patient like information on creating a medical advance directive? - No - Patient declined - No - patient declined information No - patient declined information - -    Tobacco Social History   Tobacco Use  Smoking Status Former Smoker  . Quit date: 03/20/1994  . Years since quitting: 25.0  Smokeless Tobacco Never Used     Counseling given: Not Answered   Clinical Intake:  Pre-visit preparation completed: Yes  Pain : No/denies pain     Diabetes: No     Interpreter Needed?: No     Past Medical History:  Diagnosis Date  . Breast cancer ([place]) 2004   left breast, Paget's disease, full involvement of nipple with invasive ductal carcinoma  . Hepatitis C 07/17/2011   Positive Hepatitis C Antibody test and then confirmed with Hepatitis C Virus RNA by PCR.  Marland Kitchen Hypercholesteremia    dx 2010  . Hypertension    dx 1996  . Leukopenia 07/10/2011   Stable compared to 2012   Past Surgical History:  Procedure Laterality Date  . ABDOMINAL HYSTERECTOMY     late 1990s.  Marland Kitchen BREAST SURGERY Left    2004  . MASTECTOMY  2005   left side   Family History  Problem Relation Age of Onset  . Hypertension Mother   . Arthritis Sister        RA   Social History   Socioeconomic History  . Marital status: Married    Spouse name: Not on file  . Number of children: Not on file  . Years of education: Not on file  . Highest education level: Not on file  Occupational History  . Not on file  Tobacco Use  . Smoking status: Former Smoker    Quit date: 03/20/1994    Years since quitting: 25.0  . Smokeless tobacco: Never Used  Substance and Sexual Activity  . Alcohol use: No  . Drug use: No  . Sexual activity: Yes    Birth control/protection: Condom  Other Topics Concern  . Not on file  Social History Narrative   Recently separated from husband of 86 years.  Social Determinants of Health   Financial Resource Strain:   . Difficulty of Paying Living Expenses: Not on file  Food Insecurity:   . Worried About Charity fundraiser in the Last Year: Not on file  . Ran Out of Food in the Last Year: Not on file  Transportation Needs:   . Lack of Transportation (Medical): Not on file  . Lack of Transportation (Non-Medical): Not on file  Physical Activity:   . Days of Exercise per Week: Not on file  . Minutes of Exercise per Session: Not on file  Stress:   . Feeling of Stress : Not on file  Social Connections:   . Frequency of Communication with Friends and Family: Not on file  . Frequency of Social Gatherings with Friends and Family: Not on file  .  Attends Religious Services: Not on file  . Active Member of Clubs or Organizations: Not on file  . Attends Archivist Meetings: Not on file  . Marital Status: Not on file    Outpatient Encounter Medications as of 04/18/2019  Medication Sig  . acetaminophen (TYLENOL) 325 MG tablet Take 2 tablets (650 mg total) by mouth every 6 (six) hours as needed.  Marland Kitchen amLODipine (NORVASC) 10 MG tablet Take 1 tablet (10 mg total) by mouth daily.  Marland Kitchen atorvastatin (LIPITOR) 20 MG tablet Take 1 tablet (20 mg total) by mouth daily.  . cetirizine (ZYRTEC) 10 MG tablet Take 1 tablet (10 mg total) by mouth daily.  . Elastic Bandages & Supports (WRIST SPLINT/ELASTIC LEFT MED) MISC 1 each by Does not apply route daily.  . Elastic Bandages & Supports (WRIST SPLINT/ELASTIC RIGHT MED) MISC 1 each by Does not apply route daily.  Marland Kitchen escitalopram (LEXAPRO) 20 MG tablet Take 1 tablet (20 mg total) by mouth daily.  . hydrOXYzine (ATARAX/VISTARIL) 25 MG tablet Take 1 tablet (25 mg total) by mouth 3 (three) times daily as needed.  . isosorbide mononitrate (IMDUR) 30 MG 24 hr tablet Take 1 tablet (30 mg total) by mouth daily.  Marland Kitchen lisinopril (ZESTRIL) 40 MG tablet Take 1 tablet (40 mg total) by mouth daily.  Marland Kitchen LORazepam (ATIVAN) 0.5 MG tablet Take 1 tablet (0.5 mg total) by mouth every 8 (eight) hours as needed for anxiety or sleep.  . propranolol ER (INDERAL LA) 80 MG 24 hr capsule Take 1 capsule (80 mg total) by mouth at bedtime.  . traZODone (DESYREL) 50 MG tablet Take 1 tablet (50 mg total) by mouth at bedtime.  . meloxicam (MOBIC) 7.5 MG tablet Take 1 tablet (7.5 mg total) by mouth daily. (Patient not taking: Reported on 04/18/2019)  . predniSONE (DELTASONE) 20 MG tablet Take 1 tablet (20 mg total) by mouth daily with breakfast. (Patient not taking: Reported on 04/18/2019)   No facility-administered encounter medications on file as of 04/18/2019.    Activities of Daily Living In your present state of health, do you  have any difficulty performing the following activities: 04/18/2019  Hearing? N  Vision? N  Difficulty concentrating or making decisions? N  Walking or climbing stairs? N  Dressing or bathing? N  Doing errands, shopping? N  Some recent data might be hidden    Patient Care Team: Charlott Rakes, MD as PCP - General (Family Medicine) Danie Binder, MD (Gastroenterology)     Constitutional: normal appearing,  Eyes: PERRLA HEENT: Head is atraumatic, normal sinuses, normal oropharynx, normal appearing tonsils and palate, tympanic membrane is normal bilaterally. Neck: normal range of motion,  no thyromegaly, no JVD Breast: Left mastectomy scar ; right breast with no tenderness or lump Cardiovascular: normal rate and rhythm, normal heart sounds, no murmurs, rub or gallop, no pedal edema Respiratory: Normal breath sounds, clear to auscultation bilaterally, no wheezes, no rales, no rhonchi Abdomen: soft, not tender to palpation, normal bowel sounds, no enlarged organs Musculoskeletal: Full ROM, no tenderness in joints Skin: warm and dry, no lesions. Neurological: alert, oriented x3, cranial nerves I-XII grossly intact , normal motor strength, normal sensation. Psychological: normal mood.   Assessment:   This is a routine wellness examination for [facility].  Exercise Activities and Dietary recommendations    Goals    . Blood Pressure < 150/90       Fall Risk Fall Risk  04/18/2019 11/08/2018 12/17/2016 10/21/2015 09/09/2015  Falls in the past year? 0 0 No No No   Is the patient's home free of loose throw rugs in walkways, pet beds, electrical cords, etc?   yes      Grab bars in the bathroom? yes      Handrails on the stairs?   n/a      Adequate lighting?   yes  Timed Get Up and Go performed: yes  Depression Screen PHQ 2/9 Scores 04/18/2019 11/08/2018 03/28/2018 12/21/2017  PHQ - 2 Score 0 0 5 3  PHQ- 9 Score - 0 17 -     Cognitive Function  Normal      Immunization History    Administered Date(s) Administered  . Influenza,inj,Quad PF,6+ Mos 11/27/2014, 10/21/2015, 12/21/2017, 11/09/2018  . Influenza-Unspecified 10/31/2013  . Pneumococcal Conjugate-13 06/01/2017  . Tdap 06/01/2017    Qualifies for Shingles Vaccine?yes  Screening Tests Health Maintenance  Topic Date Due  . COLONOSCOPY  03/22/2001  . MAMMOGRAM  07/02/2013  . DEXA SCAN  03/22/2016  . PNA vac Low Risk Adult (2 of 2 - PPSV23) 06/02/2018  . COLON CANCER SCREENING ANNUAL FOBT  01/03/2020  . TETANUS/TDAP  06/02/2027  . INFLUENZA VACCINE  Completed  . Hepatitis C Screening  Completed    Cancer Screenings: Lung: Low Dose CT Chest recommended if Age 86-80 years, 30 pack-year currently smoking OR have quit w/in 15years. Patient does not qualify. Breast:  Up to date on Mammogram? No   Up to date of Bone Density/Dexa? No Colorectal: no  Additional Screenings: completed: Hepatitis C Screening:      Plan:    1. Encounter for Medicare annual wellness exam Counseled on 150 minutes of exercise per week, healthy eating (including decreased daily intake of saturated fats, cholesterol, added sugars, sodium), routine healthcare maintenance. - Basic Metabolic Panel  2. Encounter for screening mammogram for malignant neoplasm of breast - MM 3D SCREEN BREAST BILATERAL; Future  3. Screening for colon cancer - Ambulatory referral to Gastroenterology  4. Estrogen deficiency - DG Bone Density; Future    I have personally reviewed and noted the following in the patient's chart:   . Medical and social history . Use of alcohol, tobacco or illicit drugs  . Current medications and supplements . Functional ability and status . Nutritional status . Physical activity . Advanced directives . List of other physicians . Hospitalizations, surgeries, and ER visits in previous 12 months . Vitals . Screenings to include cognitive, depression, and falls . Referrals and appointments  In addition, I have  reviewed and discussed with patient certain preventive protocols, quality metrics, and best practice recommendations. A written personalized care plan for preventive services as well as general preventive health  recommendations were provided to patient.     Charlott Rakes, MD  04/18/2019

## 2019-04-18 NOTE — Patient Instructions (Signed)
  [name] , Thank you for taking time to come for your Medicare Wellness Visit. I appreciate your ongoing commitment to your health goals. Please review the following plan we discussed and let me know if I can assist you in the future.   These are the goals we discussed: Goals    . Blood Pressure < 150/90       This is a list of the screening recommended for you and due dates:  Health Maintenance  Topic Date Due  . Colon Cancer Screening  03/22/2001  . Mammogram  07/02/2013  . DEXA scan (bone density measurement)  03/22/2016  . Pneumonia vaccines (2 of 2 - PPSV23) 06/02/2018  . Stool Blood Test  01/03/2020  . Tetanus Vaccine  06/02/2027  . Flu Shot  Completed  .  Hepatitis C: One time screening is recommended by Center for Disease Control  (CDC) for  adults born from [birth year] through 1964.   Completed

## 2019-04-19 LAB — BASIC METABOLIC PANEL
BUN/Creatinine Ratio: 13 (ref 12–28)
BUN: 16 mg/dL (ref 8–27)
CO2: 24 mmol/L (ref 20–29)
Calcium: 9.7 mg/dL (ref 8.7–10.3)
Chloride: 102 mmol/L (ref 96–106)
Creatinine, Ser: 1.26 mg/dL — ABNORMAL HIGH (ref 0.57–1.00)
GFR calc Af Amer: 51 mL/min/{1.73_m2} — ABNORMAL LOW (ref >59)
GFR calc non Af Amer: 44 mL/min/{1.73_m2} — ABNORMAL LOW (ref >59)
Glucose: 103 mg/dL — ABNORMAL HIGH (ref 65–99)
Potassium: 4.3 mmol/L (ref 3.5–5.2)
Sodium: 143 mmol/L (ref 134–144)

## 2019-04-20 ENCOUNTER — Telehealth: Payer: Self-pay

## 2019-04-20 NOTE — Telephone Encounter (Signed)
Patient name and DOB has been verified Patient was informed of lab results. Patient had no questions.  

## 2019-04-20 NOTE — Telephone Encounter (Signed)
-----   Message from Charlott Rakes, MD sent at 04/19/2019  9:09 PM EST ----- Please inform the patient that labs are normal. Thank you.

## 2019-04-27 ENCOUNTER — Ambulatory Visit: Payer: Medicare Other

## 2019-05-04 ENCOUNTER — Ambulatory Visit: Payer: Medicare Other | Attending: Internal Medicine

## 2019-05-04 DIAGNOSIS — Z23 Encounter for immunization: Secondary | ICD-10-CM | POA: Insufficient documentation

## 2019-05-04 NOTE — Progress Notes (Signed)
   Covid-19 Vaccination Clinic  Name:  [name]    MRN: [id number] DOB: 1951-02-07  05/04/2019  [name] was observed post Covid-19 immunization for 15 minutes without incident. She was provided with Vaccine Information Sheet and instruction to access the V-Safe system.   [name] was instructed to call 911 with any severe reactions post vaccine: Marland Kitchen Difficulty breathing  . Swelling of face and throat  . A fast heartbeat  . A bad rash all over body  . Dizziness and weakness

## 2019-05-23 MED FILL — AZELASTINE HCL 0.05 % SOLN: 0.05 | 22 days supply | Qty: 6 | Fill #0

## 2019-05-24 MED FILL — XIIDRA 5 % SOLN: 5 | 30 days supply | Qty: 60 | Fill #0

## 2019-05-24 MED FILL — AMOXICILLIN 500 MG CAPSULE: 500 | 7 days supply | Qty: 14 | Fill #0

## 2019-05-25 ENCOUNTER — Other Ambulatory Visit: Payer: Self-pay

## 2019-05-25 ENCOUNTER — Ambulatory Visit: Payer: Medicare Other

## 2019-05-25 ENCOUNTER — Ambulatory Visit: Payer: Medicare Other | Attending: Family Medicine | Admitting: Family Medicine

## 2019-05-30 ENCOUNTER — Ambulatory Visit: Payer: Medicare Other | Attending: Internal Medicine

## 2019-05-30 DIAGNOSIS — Z23 Encounter for immunization: Secondary | ICD-10-CM

## 2019-05-30 NOTE — Progress Notes (Signed)
   Covid-19 Vaccination Clinic  Name:  [name]    MRN: [id number] DOB: 06/30/1950  05/30/2019  [name] was observed post Covid-19 immunization for 15 minutes without incident. She was provided with Vaccine Information Sheet and instruction to access the V-Safe system.   [name] was instructed to call 911 with any severe reactions post vaccine: Marland Kitchen Difficulty breathing  . Swelling of face and throat  . A fast heartbeat  . A bad rash all over body  . Dizziness and weakness   Immunizations Administered    Name Date Dose VIS Date Route   Pfizer COVID-19 Vaccine 05/30/2019  3:10 PM 0.3 mL 02/10/2019 Intramuscular   Manufacturer: [place]   Lot: [id number]   [place]: [id number]

## 2019-06-01 ENCOUNTER — Encounter: Payer: Self-pay | Admitting: Family Medicine

## 2019-07-04 ENCOUNTER — Other Ambulatory Visit: Payer: Self-pay | Admitting: Family Medicine

## 2019-07-04 DIAGNOSIS — F32A Depression, unspecified: Secondary | ICD-10-CM

## 2019-07-04 DIAGNOSIS — F419 Anxiety disorder, unspecified: Secondary | ICD-10-CM

## 2019-07-04 DIAGNOSIS — I10 Essential (primary) hypertension: Secondary | ICD-10-CM

## 2019-07-04 MED FILL — hydrOXYzine HCL 25 MG TABS: 25 | 30 days supply | Qty: 90 | Fill #2

## 2019-07-05 ENCOUNTER — Encounter: Payer: Self-pay | Admitting: Family Medicine

## 2019-07-05 ENCOUNTER — Ambulatory Visit: Payer: Medicare Other | Attending: Family Medicine | Admitting: Family Medicine

## 2019-07-05 ENCOUNTER — Other Ambulatory Visit: Payer: Self-pay

## 2019-07-05 VITALS — BP 128/72 | HR 77 | Ht 63.0 in | Wt 164.6 lb

## 2019-07-05 DIAGNOSIS — E7800 Pure hypercholesterolemia, unspecified: Secondary | ICD-10-CM

## 2019-07-05 DIAGNOSIS — F419 Anxiety disorder, unspecified: Secondary | ICD-10-CM | POA: Diagnosis not present

## 2019-07-05 DIAGNOSIS — F32A Depression, unspecified: Secondary | ICD-10-CM

## 2019-07-05 DIAGNOSIS — I10 Essential (primary) hypertension: Secondary | ICD-10-CM

## 2019-07-05 DIAGNOSIS — M19041 Primary osteoarthritis, right hand: Secondary | ICD-10-CM | POA: Diagnosis not present

## 2019-07-05 DIAGNOSIS — M19042 Primary osteoarthritis, left hand: Secondary | ICD-10-CM

## 2019-07-05 DIAGNOSIS — F329 Major depressive disorder, single episode, unspecified: Secondary | ICD-10-CM

## 2019-07-05 MED ORDER — ISOSORBIDE MONONITRATE ER 30 MG PO TB24: 30.0000 mg | ORAL_TABLET | Freq: Every day | ORAL | 6 refills | Status: DC

## 2019-07-05 MED ORDER — ATORVASTATIN CALCIUM 20 MG PO TABS: 20.0000 mg | ORAL_TABLET | Freq: Every day | ORAL | 1 refills | Status: DC

## 2019-07-05 MED ORDER — MELOXICAM 7.5 MG PO TABS: 7.5000 mg | ORAL_TABLET | Freq: Every day | ORAL | 1 refills | Status: DC

## 2019-07-05 MED ORDER — TRAZODONE HCL 50 MG PO TABS: 50.0000 mg | ORAL_TABLET | Freq: Every day | ORAL | 1 refills | Status: DC

## 2019-07-05 MED ORDER — ESCITALOPRAM OXALATE 20 MG PO TABS: 20.0000 mg | ORAL_TABLET | Freq: Every day | ORAL | 1 refills | Status: DC

## 2019-07-05 MED ORDER — LISINOPRIL 40 MG PO TABS: 40.0000 mg | ORAL_TABLET | Freq: Every day | ORAL | 1 refills | Status: DC

## 2019-07-05 MED ORDER — AMLODIPINE BESYLATE 10 MG PO TABS: 10.0000 mg | ORAL_TABLET | Freq: Every day | ORAL | 1 refills | Status: DC

## 2019-07-05 MED ORDER — PROPRANOLOL HCL ER 80 MG PO CP24: 80.0000 mg | ORAL_CAPSULE | Freq: Every day | ORAL | 1 refills | Status: DC

## 2019-07-05 MED ORDER — HYDROXYZINE HCL 25 MG PO TABS: 25.0000 mg | ORAL_TABLET | Freq: Three times a day (TID) | ORAL | 3 refills | Status: DC | PRN

## 2019-07-05 MED FILL — ESCITALOPRAM 20 MG TABLET: 20 | 90 days supply | Qty: 90 | Fill #0

## 2019-07-05 MED FILL — AMLODIPINE BESYLATE 10 MG T: 10 | 90 days supply | Qty: 90 | Fill #0

## 2019-07-05 MED FILL — MELOXICAM 7.5 MG TABLET: 7.5 | 30 days supply | Qty: 30 | Fill #0

## 2019-07-05 MED FILL — traZODone HCL 50 MG TABS: 50 | 90 days supply | Qty: 90 | Fill #0

## 2019-07-05 MED FILL — LISINOPRIL 40 MG TABLET: 40 | 90 days supply | Qty: 90 | Fill #0

## 2019-07-05 MED FILL — ATORVASTATIN CALCIUM 20 MG: 20 | 90 days supply | Qty: 90 | Fill #0

## 2019-07-05 MED FILL — ISOSORBIDE MN ER 30 MG TAB: 30 | 90 days supply | Qty: 90 | Fill #0

## 2019-07-05 MED FILL — PROPRANOLOL ER 80 MG CAP: 80 | 90 days supply | Qty: 90 | Fill #0

## 2019-07-05 NOTE — Progress Notes (Signed)
Subjective:  Patient ID: [name], female    DOB: 1951-02-27  Age: 69 y.o. MRN: [id number]  CC: Hypertension   HPI [name] is a 69 -year-old female with a history of hypertension, anxiety and depression, history of breast cancer (status post left mastectomy over 16 years ago) who presents today for a follow-up visit. She complains of pain in her wrists which are worse in the morning and on rainy days.  Today pain is moderate to severe more in the lateral aspect of her left wrist and she denies history of trauma.  She currently has no medications for this. Doing well on her antihypertensive and her anxiety and depression controlled. She states she will be traveling to the [place] soon for vacation She has no additional concerns today.  Past Medical History:  Diagnosis Date  . Breast cancer ([place]) 2004   left breast, Paget's disease, full involvement of nipple with invasive ductal carcinoma  . Hepatitis C 07/17/2011   Positive Hepatitis C Antibody test and then confirmed with Hepatitis C Virus RNA by PCR.  Marland Kitchen Hypercholesteremia    dx 2010  . Hypertension    dx 1996  . Leukopenia 07/10/2011   Stable compared to 2012    Past Surgical History:  Procedure Laterality Date  . ABDOMINAL HYSTERECTOMY     late 1990s.  Marland Kitchen BREAST SURGERY Left    2004  . MASTECTOMY  2005   left side    Family History  Problem Relation Age of Onset  . Hypertension Mother   . Arthritis Sister        RA    Allergies  Allergen Reactions  . Fish Allergy Shortness Of Breath  . Strawberry Extract Shortness Of Breath  . Aspirin Nausea Only    Outpatient Medications Prior to Visit  Medication Sig Dispense Refill  . acetaminophen (TYLENOL) 325 MG tablet Take 2 tablets (650 mg total) by mouth every 6 (six) hours as needed. 30 tablet 0  . amLODipine (NORVASC) 10 MG tablet Take 1 tablet (10 mg total) by mouth daily. 90 tablet 1  . atorvastatin (LIPITOR) 20 MG tablet Take 1 tablet (20 mg total) by  mouth daily. 90 tablet 1  . cetirizine (ZYRTEC) 10 MG tablet Take 1 tablet (10 mg total) by mouth daily. 30 tablet 6  . Elastic Bandages & Supports (WRIST SPLINT/ELASTIC LEFT MED) MISC 1 each by Does not apply route daily. 1 each 0  . Elastic Bandages & Supports (WRIST SPLINT/ELASTIC RIGHT MED) MISC 1 each by Does not apply route daily. 1 each 0  . escitalopram (LEXAPRO) 20 MG tablet Take 1 tablet (20 mg total) by mouth daily. 90 tablet 1  . hydrOXYzine (ATARAX/VISTARIL) 25 MG tablet Take 1 tablet (25 mg total) by mouth 3 (three) times daily as needed. 90 tablet 3  . isosorbide mononitrate (IMDUR) 30 MG 24 hr tablet Take 1 tablet (30 mg total) by mouth daily. 30 tablet 6  . lisinopril (ZESTRIL) 40 MG tablet Take 1 tablet (40 mg total) by mouth daily. 90 tablet 1  . LORazepam (ATIVAN) 0.5 MG tablet Take 1 tablet (0.5 mg total) by mouth every 8 (eight) hours as needed for anxiety or sleep. 30 tablet 0  . propranolol ER (INDERAL LA) 80 MG 24 hr capsule Take 1 capsule (80 mg total) by mouth at bedtime. 90 capsule 1  . traZODone (DESYREL) 50 MG tablet Take 1 tablet (50 mg total) by mouth at bedtime. 90 tablet 1  .  meloxicam (MOBIC) 7.5 MG tablet Take 1 tablet (7.5 mg total) by mouth daily. (Patient not taking: Reported on 04/18/2019) 30 tablet 1  . predniSONE (DELTASONE) 20 MG tablet Take 1 tablet (20 mg total) by mouth daily with breakfast. (Patient not taking: Reported on 04/18/2019) 5 tablet 0   No facility-administered medications prior to visit.     ROS Review of Systems  Constitutional: Negative for activity change, appetite change and fatigue.  HENT: Negative for congestion, sinus pressure and sore throat.   Eyes: Negative for visual disturbance.  Respiratory: Negative for cough, chest tightness, shortness of breath and wheezing.   Cardiovascular: Negative for chest pain and palpitations.  Gastrointestinal: Negative for abdominal distention, abdominal pain and constipation.  Endocrine:  Negative for polydipsia.  Genitourinary: Negative for dysuria and frequency.  Musculoskeletal:       See HPI  Skin: Negative for rash.  Neurological: Negative for tremors, light-headedness and numbness.  Hematological: Does not bruise/bleed easily.  Psychiatric/Behavioral: Negative for agitation and behavioral problems.    Objective:  BP 128/72   Pulse 77   Ht _0  (1.6 m)   Wt 164 lb 9.6 oz (74.7 kg)   SpO2 98%   BMI 29.16 kg/m   BP/Weight 07/05/2019 04/18/2019 8/46/9629  Systolic BP 528 413 244  Diastolic BP 72 76 72  Wt. (Lbs) 164.6 164 161.4  BMI 29.16 29.05 28.59      Physical Exam Constitutional:      Appearance: She is well-developed.  Neck:     Vascular: No JVD.  Cardiovascular:     Rate and Rhythm: Normal rate.     Heart sounds: Normal heart sounds. No murmur.  Pulmonary:     Effort: Pulmonary effort is normal.     Breath sounds: Normal breath sounds. No wheezing or rales.  Chest:     Chest wall: No tenderness.  Abdominal:     General: Bowel sounds are normal. There is no distension.     Palpations: Abdomen is soft. There is no mass.     Tenderness: There is no abdominal tenderness.  Musculoskeletal:        General: Tenderness (radial aspect of L wrist) present. Normal range of motion.     Right lower leg: No edema.     Left lower leg: No edema.  Neurological:     Mental Status: She is alert and oriented to person, place, and time.  Psychiatric:        Mood and Affect: Mood normal.     CMP Latest Ref Rng & Units 04/18/2019 03/28/2018 06/01/2017  Glucose 65 - 99 mg/dL 103(H) 99 103(H)  BUN 8 - 27 mg/dL _1 Creatinine 0.57 - 1.00 mg/dL 1.26(H) 0.98 1.28(H)  Sodium 134 - 144 mmol/L 143 141 142  Potassium 3.5 - 5.2 mmol/L 4.3 4.4 3.8  Chloride 96 - 106 mmol/L 102 102 101  CO2 20 - 29 mmol/L _2 Calcium 8.7 - 10.3 mg/dL 9.7 9.9 9.5  Total Protein 6.0 - 8.5 g/dL - 7.2 7.2  Total Bilirubin 0.0 - 1.2 mg/dL - 0.7 0.5  Alkaline Phos 39 - 117  IU/L - 82 79  AST 0 - 40 IU/L - 45(H) 42(H)  ALT 0 - 32 IU/L - 41(H) 32    Lipid Panel     Component Value Date/Time   CHOL 198 03/28/2018 1609   TRIG 65 03/28/2018 1609   HDL 87 03/28/2018 1609   CHOLHDL 2.3 03/28/2018 1609  CHOLHDL 3.5 03/20/2014 1118   VLDL 20 03/20/2014 1118   LDLCALC 98 03/28/2018 1609    CBC    Component Value Date/Time   WBC 4.3 12/17/2016 1539   WBC 4.5 03/20/2014 1118   RBC 4.74 12/17/2016 1539   RBC 4.52 03/20/2014 1118   HGB 13.7 12/17/2016 1539   HCT 41.7 12/17/2016 1539   HCT 38 08/12/2011 1445   PLT 235 12/17/2016 1539   MCV 88 12/17/2016 1539   MCV 82.2 08/12/2011 1445   MCH 28.9 12/17/2016 1539   MCH 28.8 03/20/2014 1118   MCHC 32.9 12/17/2016 1539   MCHC 34.3 03/20/2014 1118   RDW 14.3 12/17/2016 1539   LYMPHSABS 2.1 12/17/2016 1539   MONOABS 0.3 07/10/2011 1327   EOSABS 0.1 12/17/2016 1539   BASOSABS 0.0 12/17/2016 1539    Lab Results  Component Value Date   HGBA1C 5.40 04/19/2015   Depression screen PHQ 2/9 07/05/2019 04/18/2019 11/08/2018  Decreased Interest 1 0 0  Down, Depressed, Hopeless 1 0 0  PHQ - 2 Score 2 0 0  Altered sleeping 1 - 0  Tired, decreased energy 0 - 0  Change in appetite 1 - 0  Feeling bad or failure about yourself  1 - 0  Trouble concentrating 0 - 0  Moving slowly or fidgety/restless 1 - 0  Suicidal thoughts 0 - 0  PHQ-9 Score 6 - 0  Difficult doing work/chores - - -  Some recent data might be hidden     Assessment & Plan:  1. Primary osteoarthritis of both hands Uncontrolled She does have intermittent flares Placed on meloxicam - meloxicam (MOBIC) 7.5 MG tablet; Take 1 tablet (7.5 mg total) by mouth daily.  Dispense: 30 tablet; Refill: 1  2. Anxiety and depression Controlled with PHQ-9 score of 6 - traZODone (DESYREL) 50 MG tablet; Take 1 tablet (50 mg total) by mouth at bedtime.  Dispense: 90 tablet; Refill: 1 - propranolol ER (INDERAL LA) 80 MG 24 hr capsule; Take 1 capsule (80 mg total)  by mouth at bedtime.  Dispense: 90 capsule; Refill: 1 - hydrOXYzine (ATARAX/VISTARIL) 25 MG tablet; Take 1 tablet (25 mg total) by mouth 3 (three) times daily as needed.  Dispense: 90 tablet; Refill: 3 - escitalopram (LEXAPRO) 20 MG tablet; Take 1 tablet (20 mg total) by mouth daily.  Dispense: 90 tablet; Refill: 1  3. Essential hypertension Controlled Counseled on blood pressure goal of less than 130/80, low-sodium, DASH diet, medication compliance, 150 minutes of moderate intensity exercise per week. Discussed medication compliance, adverse effects. - CMP14+EGFR; Future - Lipid panel; Future - propranolol ER (INDERAL LA) 80 MG 24 hr capsule; Take 1 capsule (80 mg total) by mouth at bedtime.  Dispense: 90 capsule; Refill: 1 - lisinopril (ZESTRIL) 40 MG tablet; Take 1 tablet (40 mg total) by mouth daily.  Dispense: 90 tablet; Refill: 1 - isosorbide mononitrate (IMDUR) 30 MG 24 hr tablet; Take 1 tablet (30 mg total) by mouth daily.  Dispense: 30 tablet; Refill: 6 - amLODipine (NORVASC) 10 MG tablet; Take 1 tablet (10 mg total) by mouth daily.  Dispense: 90 tablet; Refill: 1  4. Hypercholesteremia Controlled She is due for lipid panel which I have ordered Continue atorvastatin   Healthcare maintenance-she is due for colonoscopy which she declines at this time and will call us for referral when she is back from the [place], MD, FAAFP. [facility] and [place], [place]   07/05/2019, 3:32  PM

## 2019-07-05 NOTE — Patient Instructions (Signed)
Wrist Pain, Adult There are many things that can cause wrist pain. Some common causes include:  An injury to the wrist area.  Overuse of the joint.  A condition that causes too much pressure to be put on a nerve in the wrist (carpal tunnel syndrome).  Wear and tear of the joints that happens as a person gets older (osteoarthritis).  Other types of arthritis. Sometimes, the cause of wrist pain is not known. Often, the pain goes away when you follow your doctor's instructions for helping pain at home, such as resting or icing your wrist. If your wrist pain does not go away, it is important to tell your doctor. Follow these instructions at home:  Rest the wrist area for 48 hours or more, or as long as told by your doctor.  If a splint or elastic bandage has been put on your wrist, use it as told by your doctor. ? Take off the splint or bandage only as told by your doctor. ? Loosen the splint or bandage if your fingers tingle, lose feeling (get numb), or turn cold or blue.  If directed, apply ice to the injured area: ? If you have a removable splint or elastic bandage, remove it as told by your doctor. ? Put ice in a plastic bag. ? Place a towel between your skin and the bag or between your splint or bandage and the bag. ? Leave the ice on for 20 minutes, 2-3 times a day.   Keep your arm raised (elevated) above the level of your heart while you are sitting or lying down.  Take over-the-counter and prescription medicines only as told by your doctor.  Keep all follow-up visits as told by your doctor. This is important. Contact a doctor if:  You have a sudden sharp pain in the wrist, hand, or arm that is different or new.  The swelling or bruising on your wrist or hand gets worse.  Your skin becomes red, gets a rash, or has open sores.  Your pain does not get better or it gets worse. Get help right away if:  You lose feeling in your fingers or hand.  Your fingers turn white,  very red, or cold and blue.  You cannot move your fingers.  You have a fever or chills. This information is not intended to replace advice given to you by your health care provider. Make sure you discuss any questions you have with your health care provider. Document Revised: 01/29/2017 Document Reviewed: 09/05/2015 Elsevier Patient Education  2020 [employer] Inc[employer]    

## 2019-07-14 ENCOUNTER — Ambulatory Visit: Payer: Medicare Other

## 2019-07-14 ENCOUNTER — Other Ambulatory Visit: Payer: Medicare Other

## 2019-07-17 ENCOUNTER — Other Ambulatory Visit: Payer: Self-pay

## 2019-07-17 ENCOUNTER — Ambulatory Visit: Payer: Medicare Other | Attending: Family Medicine | Admitting: Pharmacist

## 2019-07-17 DIAGNOSIS — Z23 Encounter for immunization: Secondary | ICD-10-CM

## 2019-07-17 DIAGNOSIS — I10 Essential (primary) hypertension: Secondary | ICD-10-CM

## 2019-07-17 NOTE — Progress Notes (Signed)
Patient presents for vaccination against Zoster per orders of Dr. Margarita Rana. Consent given. Counseling provided. No contraindications exists. Vaccine administered without incident.

## 2019-07-18 LAB — CMP14+EGFR
ALT: 57 IU/L — ABNORMAL HIGH (ref 0–32)
AST: 70 IU/L — ABNORMAL HIGH (ref 0–40)
Albumin/Globulin Ratio: 1.7 (ref 1.2–2.2)
Albumin: 4.5 g/dL (ref 3.8–4.8)
Alkaline Phosphatase: 84 IU/L (ref 48–121)
BUN/Creatinine Ratio: 14 (ref 12–28)
BUN: 14 mg/dL (ref 8–27)
Bilirubin Total: 0.8 mg/dL (ref 0.0–1.2)
CO2: 20 mmol/L (ref 20–29)
Calcium: 9.6 mg/dL (ref 8.7–10.3)
Chloride: 97 mmol/L (ref 96–106)
Creatinine, Ser: 1.01 mg/dL — ABNORMAL HIGH (ref 0.57–1.00)
GFR calc Af Amer: 66 mL/min/{1.73_m2} (ref >59)
GFR calc non Af Amer: 57 mL/min/{1.73_m2} — ABNORMAL LOW (ref >59)
Globulin, Total: 2.7 g/dL (ref 1.5–4.5)
Glucose: 110 mg/dL — ABNORMAL HIGH (ref 65–99)
Potassium: 4 mmol/L (ref 3.5–5.2)
Sodium: 137 mmol/L (ref 134–144)
Total Protein: 7.2 g/dL (ref 6.0–8.5)

## 2019-07-18 LAB — LIPID PANEL
Chol/HDL Ratio: 2.1 ratio (ref 0.0–4.4)
Cholesterol, Total: 158 mg/dL (ref 100–199)
HDL: 77 mg/dL (ref >39)
LDL Chol Calc (NIH): 63 mg/dL (ref 0–99)
Triglycerides: 101 mg/dL (ref 0–149)
VLDL Cholesterol Cal: 18 mg/dL (ref 5–40)

## 2019-07-19 ENCOUNTER — Telehealth: Payer: Self-pay

## 2019-07-19 NOTE — Telephone Encounter (Signed)
-----   Message from Charlott Rakes, MD sent at 07/18/2019  8:25 AM EDT ----- Kidney function, cholesterol level is normal.  Liver enzymes are slightly elevated and this was the case about 2 years ago.  No regimen change needs to be made at this time but we will repeat this at her next visit to follow-up.

## 2019-07-19 NOTE — Telephone Encounter (Signed)
Patient returned phone call and was informed of lab results. 

## 2019-07-19 NOTE — Telephone Encounter (Signed)
Patient was called and a voicemail was left informing patient to return phone call for lab results. 

## 2019-09-21 ENCOUNTER — Other Ambulatory Visit: Payer: Medicare Other

## 2019-09-21 ENCOUNTER — Ambulatory Visit: Payer: Medicare Other

## 2019-10-16 MED FILL — AMLODIPINE BESYLATE 10 MG T: 10 | 90 days supply | Qty: 90 | Fill #1

## 2019-10-16 MED FILL — MELOXICAM 7.5 MG TABLET: 7.5 | 30 days supply | Qty: 30 | Fill #1

## 2019-10-16 MED FILL — ATORVASTATIN CALCIUM 20 MG: 20 | 90 days supply | Qty: 90 | Fill #1

## 2019-10-16 MED FILL — hydrOXYzine HCL 25 MG TABS: 25 | 30 days supply | Qty: 90 | Fill #3

## 2019-10-16 MED FILL — PROPRANOLOL HCL ER 80 MG CP: 80 | 90 days supply | Qty: 90 | Fill #1

## 2019-10-16 MED FILL — traZODone HCL 50 MG TABS: 50 | 90 days supply | Qty: 90 | Fill #1

## 2019-10-16 MED FILL — ISOSORBIDE MN ER 30 MG TAB: 30 | 90 days supply | Qty: 90 | Fill #1

## 2019-10-16 MED FILL — ESCITALOPRAM 20 MG TABLET: 20 | 90 days supply | Qty: 90 | Fill #1

## 2019-10-16 MED FILL — LISINOPRIL 40 MG TABLET: 40 | 90 days supply | Qty: 90 | Fill #1

## 2020-01-09 ENCOUNTER — Other Ambulatory Visit: Payer: Self-pay | Admitting: Family Medicine

## 2020-01-09 ENCOUNTER — Ambulatory Visit: Payer: Medicare Other | Attending: Family Medicine | Admitting: Family Medicine

## 2020-01-09 ENCOUNTER — Other Ambulatory Visit: Payer: Self-pay

## 2020-01-09 ENCOUNTER — Encounter: Payer: Self-pay | Admitting: Family Medicine

## 2020-01-09 VITALS — BP 160/73 | HR 63 | Ht 63.0 in | Wt 164.0 lb

## 2020-01-09 DIAGNOSIS — Z1211 Encounter for screening for malignant neoplasm of colon: Secondary | ICD-10-CM

## 2020-01-09 DIAGNOSIS — F419 Anxiety disorder, unspecified: Secondary | ICD-10-CM

## 2020-01-09 DIAGNOSIS — E7800 Pure hypercholesterolemia, unspecified: Secondary | ICD-10-CM

## 2020-01-09 DIAGNOSIS — Z23 Encounter for immunization: Secondary | ICD-10-CM

## 2020-01-09 DIAGNOSIS — M19041 Primary osteoarthritis, right hand: Secondary | ICD-10-CM | POA: Diagnosis not present

## 2020-01-09 DIAGNOSIS — F32A Depression, unspecified: Secondary | ICD-10-CM

## 2020-01-09 DIAGNOSIS — I10 Essential (primary) hypertension: Secondary | ICD-10-CM

## 2020-01-09 DIAGNOSIS — M19042 Primary osteoarthritis, left hand: Secondary | ICD-10-CM

## 2020-01-09 MED ORDER — AMLODIPINE BESYLATE 10 MG PO TABS: 10.0000 mg | ORAL_TABLET | Freq: Every day | ORAL | 1 refills | Status: DC

## 2020-01-09 MED ORDER — ISOSORBIDE MONONITRATE ER 30 MG PO TB24: 30.0000 mg | ORAL_TABLET | Freq: Every day | ORAL | 1 refills | Status: DC

## 2020-01-09 MED ORDER — LISINOPRIL 40 MG PO TABS: 40.0000 mg | ORAL_TABLET | Freq: Every day | ORAL | 1 refills | Status: DC

## 2020-01-09 MED ORDER — ESCITALOPRAM OXALATE 20 MG PO TABS: 20.0000 mg | ORAL_TABLET | Freq: Every day | ORAL | 1 refills | Status: DC

## 2020-01-09 MED ORDER — PROPRANOLOL HCL ER 80 MG PO CP24: 80.0000 mg | ORAL_CAPSULE | Freq: Every day | ORAL | 1 refills | Status: DC

## 2020-01-09 MED ORDER — ATORVASTATIN CALCIUM 20 MG PO TABS: 20.0000 mg | ORAL_TABLET | Freq: Every day | ORAL | 1 refills | Status: DC

## 2020-01-09 MED ORDER — TRAZODONE HCL 50 MG PO TABS: 50.0000 mg | ORAL_TABLET | Freq: Every day | ORAL | 1 refills | Status: DC

## 2020-01-09 MED ORDER — HYDROXYZINE HCL 25 MG PO TABS: 25.0000 mg | ORAL_TABLET | Freq: Three times a day (TID) | ORAL | 3 refills | Status: DC | PRN

## 2020-01-09 MED ORDER — MELOXICAM 7.5 MG PO TABS: 7.5000 mg | ORAL_TABLET | Freq: Every day | ORAL | 3 refills | Status: DC

## 2020-01-09 MED FILL — hydrOXYzine HCL 25 MG TABS: 25 | 90 days supply | Qty: 270 | Fill #0

## 2020-01-09 MED FILL — PROPRANOLOL HCL ER 80 MG CP: 80 | 90 days supply | Qty: 90 | Fill #0

## 2020-01-09 MED FILL — ISOSORBIDE MN ER 30 MG TAB: 30 | 90 days supply | Qty: 90 | Fill #0

## 2020-01-09 MED FILL — LISINOPRIL 40 MG TABLET: 40 | 90 days supply | Qty: 90 | Fill #0

## 2020-01-09 MED FILL — ATORVASTATIN CALCIUM 20 MG: 20 | 90 days supply | Qty: 90 | Fill #0

## 2020-01-09 MED FILL — MELOXICAM 7.5 MG TABLET: 7.5 | 30 days supply | Qty: 30 | Fill #0

## 2020-01-09 MED FILL — AMLODIPINE BESYLATE 10 MG T: 10 | 90 days supply | Qty: 90 | Fill #0

## 2020-01-09 MED FILL — traZODone HCL 50 MG TABS: 50 | 90 days supply | Qty: 90 | Fill #0

## 2020-01-09 MED FILL — ESCITALOPRAM 20 MG TABLET: 20 | 90 days supply | Qty: 90 | Fill #0

## 2020-01-09 NOTE — Progress Notes (Signed)
Having swelling and pain in her left wrist and fingers.

## 2020-01-09 NOTE — Progress Notes (Signed)
Subjective:  Patient ID: [name], female    DOB: 02/12/1951  Age: 69 y.o. MRN: [id number]  CC: Hypertension   HPI [name] is a 69 year old female with a history of hypertension, anxiety and depression, history of breast cancer (status post left mastectomy over 16 years ago) osteoarthritis of the hand who presents today forafollow-up visit. She complains of swelling in her L wrist which is a 5/10 and gets to a 10/10 at night. Has also noticed swelling and stiffness of her knuckles. Ran out of Meloxicam and has been using Tylenol. She has been moving and lifting heavy boxes or recent due to remodeling of her house. She is right-handed.  BP is elevated and she forgot to take her antihypertensive due to being busy at home. She endorses compliance otherwise. Her anxiety and depression are controlled on her current regimen. She has no additional concerns today.  Past Medical History:  Diagnosis Date   Breast cancer ([place]) 2004   left breast, Paget's disease, full involvement of nipple with invasive ductal carcinoma   Hepatitis C 07/17/2011   Positive Hepatitis C Antibody test and then confirmed with Hepatitis C Virus RNA by PCR.   Hypercholesteremia    dx 2010   Hypertension    dx 1996   Leukopenia 07/10/2011   Stable compared to 2012    Past Surgical History:  Procedure Laterality Date   ABDOMINAL HYSTERECTOMY     late 1990s.   BREAST SURGERY Left    2004   MASTECTOMY  2005   left side    Family History  Problem Relation Age of Onset   Hypertension Mother    Arthritis Sister        RA    Allergies  Allergen Reactions   Fish Allergy Shortness Of Breath   Strawberry Extract Shortness Of Breath   Aspirin Nausea Only    Outpatient Medications Prior to Visit  Medication Sig Dispense Refill   acetaminophen (TYLENOL) 325 MG tablet Take 2 tablets (650 mg total) by mouth every 6 (six) hours as needed. 30 tablet 0   cetirizine (ZYRTEC) 10 MG tablet  Take 1 tablet (10 mg total) by mouth daily. 30 tablet 6   Elastic Bandages & Supports (WRIST SPLINT/ELASTIC LEFT MED) MISC 1 each by Does not apply route daily. 1 each 0   Elastic Bandages & Supports (WRIST SPLINT/ELASTIC RIGHT MED) MISC 1 each by Does not apply route daily. 1 each 0   amLODipine (NORVASC) 10 MG tablet Take 1 tablet (10 mg total) by mouth daily. 90 tablet 1   atorvastatin (LIPITOR) 20 MG tablet Take 1 tablet (20 mg total) by mouth daily. 90 tablet 1   escitalopram (LEXAPRO) 20 MG tablet Take 1 tablet (20 mg total) by mouth daily. 90 tablet 1   hydrOXYzine (ATARAX/VISTARIL) 25 MG tablet Take 1 tablet (25 mg total) by mouth 3 (three) times daily as needed. 90 tablet 3   isosorbide mononitrate (IMDUR) 30 MG 24 hr tablet Take 1 tablet (30 mg total) by mouth daily. 30 tablet 6   lisinopril (ZESTRIL) 40 MG tablet Take 1 tablet (40 mg total) by mouth daily. 90 tablet 1   meloxicam (MOBIC) 7.5 MG tablet Take 1 tablet (7.5 mg total) by mouth daily. 30 tablet 1   propranolol ER (INDERAL LA) 80 MG 24 hr capsule Take 1 capsule (80 mg total) by mouth at bedtime. 90 capsule 1   traZODone (DESYREL) 50 MG tablet Take 1 tablet (50 mg  total) by mouth at bedtime. 90 tablet 1   No facility-administered medications prior to visit.     ROS Review of Systems  Constitutional: Negative for activity change, appetite change and fatigue.  HENT: Negative for congestion, sinus pressure and sore throat.   Eyes: Negative for visual disturbance.  Respiratory: Negative for cough, chest tightness, shortness of breath and wheezing.   Cardiovascular: Negative for chest pain and palpitations.  Gastrointestinal: Negative for abdominal distention, abdominal pain and constipation.  Endocrine: Negative for polydipsia.  Genitourinary: Negative for dysuria and frequency.  Musculoskeletal:       See HPI  Skin: Negative for rash.  Neurological: Negative for tremors, light-headedness and numbness.    Hematological: Does not bruise/bleed easily.  Psychiatric/Behavioral: Negative for agitation and behavioral problems.    Objective:  BP (!) 160/73    Pulse 63    Ht _0  (1.6 m)    Wt 164 lb (74.4 kg)    SpO2 100%    BMI 29.05 kg/m   BP/Weight 01/09/2020 07/05/2019 8/76/8115  Systolic BP 726 203 559  Diastolic BP 73 72 76  Wt. (Lbs) 164 164.6 164  BMI 29.05 29.16 29.05      Physical Exam Constitutional:      Appearance: She is well-developed.  Neck:     Vascular: No JVD.  Cardiovascular:     Rate and Rhythm: Normal rate.     Heart sounds: Normal heart sounds. No murmur heard.   Pulmonary:     Effort: Pulmonary effort is normal.     Breath sounds: Normal breath sounds. No wheezing or rales.  Chest:     Chest wall: No tenderness.  Abdominal:     General: Bowel sounds are normal. There is no distension.     Palpations: Abdomen is soft. There is no mass.     Tenderness: There is no abdominal tenderness.  Musculoskeletal:     Right lower leg: No edema.     Left lower leg: No edema.     Comments: Slight edema of the dorsal lateral aspect of left forearm with tenderness to palpation. Normal appearance of bilateral MCP and PIP joints with ability to completely flex both hands.  Neurological:     Mental Status: She is alert and oriented to person, place, and time.  Psychiatric:        Mood and Affect: Mood normal.     CMP Latest Ref Rng & Units 07/17/2019 04/18/2019 03/28/2018  Glucose 65 - 99 mg/dL 110(H) 103(H) 99  BUN 8 - 27 mg/dL _1 Creatinine 0.57 - 1.00 mg/dL 1.01(H) 1.26(H) 0.98  Sodium 134 - 144 mmol/L 137 143 141  Potassium 3.5 - 5.2 mmol/L 4.0 4.3 4.4  Chloride 96 - 106 mmol/L 97 102 102  CO2 20 - 29 mmol/L _2 Calcium 8.7 - 10.3 mg/dL 9.6 9.7 9.9  Total Protein 6.0 - 8.5 g/dL 7.2 - 7.2  Total Bilirubin 0.0 - 1.2 mg/dL 0.8 - 0.7  Alkaline Phos 48 - 121 IU/L 84 - 82  AST 0 - 40 IU/L 70(H) - 45(H)  ALT 0 - 32 IU/L 57(H) - 41(H)    Lipid Panel      Component Value Date/Time   CHOL 158 07/17/2019 1047   TRIG 101 07/17/2019 1047   HDL 77 07/17/2019 1047   CHOLHDL 2.1 07/17/2019 1047   CHOLHDL 3.5 03/20/2014 1118   VLDL 20 03/20/2014 1118   LDLCALC 63 07/17/2019 1047  CBC    Component Value Date/Time   WBC 4.3 12/17/2016 1539   WBC 4.5 03/20/2014 1118   RBC 4.74 12/17/2016 1539   RBC 4.52 03/20/2014 1118   HGB 13.7 12/17/2016 1539   HCT 41.7 12/17/2016 1539   HCT 38 08/12/2011 1445   PLT 235 12/17/2016 1539   MCV 88 12/17/2016 1539   MCV 82.2 08/12/2011 1445   MCH 28.9 12/17/2016 1539   MCH 28.8 03/20/2014 1118   MCHC 32.9 12/17/2016 1539   MCHC 34.3 03/20/2014 1118   RDW 14.3 12/17/2016 1539   LYMPHSABS 2.1 12/17/2016 1539   MONOABS 0.3 07/10/2011 1327   EOSABS 0.1 12/17/2016 1539   BASOSABS 0.0 12/17/2016 1539    Lab Results  Component Value Date   HGBA1C 5.40 04/19/2015    Assessment & Plan:  1. Anxiety and depression Controlled - traZODone (DESYREL) 50 MG tablet; Take 1 tablet (50 mg total) by mouth at bedtime.  Dispense: 90 tablet; Refill: 1 - propranolol ER (INDERAL LA) 80 MG 24 hr capsule; Take 1 capsule (80 mg total) by mouth at bedtime.  Dispense: 90 capsule; Refill: 1 - hydrOXYzine (ATARAX/VISTARIL) 25 MG tablet; Take 1 tablet (25 mg total) by mouth 3 (three) times daily as needed.  Dispense: 90 tablet; Refill: 3 - escitalopram (LEXAPRO) 20 MG tablet; Take 1 tablet (20 mg total) by mouth daily.  Dispense: 90 tablet; Refill: 1  2. Essential hypertension Uncontrolled due to missing antihypertensive Encouraged to be more compliant Counseled on blood pressure goal of less than 130/80, low-sodium, DASH diet, medication compliance, 150 minutes of moderate intensity exercise per week. Discussed medication compliance, adverse effects. - propranolol ER (INDERAL LA) 80 MG 24 hr capsule; Take 1 capsule (80 mg total) by mouth at bedtime.  Dispense: 90 capsule; Refill: 1 - lisinopril (ZESTRIL) 40 MG tablet;  Take 1 tablet (40 mg total) by mouth daily.  Dispense: 90 tablet; Refill: 1 - isosorbide mononitrate (IMDUR) 30 MG 24 hr tablet; Take 1 tablet (30 mg total) by mouth daily.  Dispense: 90 tablet; Refill: 1 - amLODipine (NORVASC) 10 MG tablet; Take 1 tablet (10 mg total) by mouth daily.  Dispense: 90 tablet; Refill: 1 - CMP14+EGFR  3. Primary osteoarthritis of both hands Acute flare due to running out of NSAID Advised to apply heat or ice whichever she prefers Recent heavy lifting might have triggered this - meloxicam (MOBIC) 7.5 MG tablet; Take 1 tablet (7.5 mg total) by mouth daily.  Dispense: 30 tablet; Refill: 3  4. Screening for colon cancer - Cologuard  5. Need for immunization against influenza - Flu Vaccine QUAD 36+ mos IM  6. Hypercholesteremia Controlled - atorvastatin (LIPITOR) 20 MG tablet; Take 1 tablet (20 mg total) by mouth daily.  Dispense: 90 tablet; Refill: 1   Meds ordered this encounter  Medications   traZODone (DESYREL) 50 MG tablet    Sig: Take 1 tablet (50 mg total) by mouth at bedtime.    Dispense:  90 tablet    Refill:  1   propranolol ER (INDERAL LA) 80 MG 24 hr capsule    Sig: Take 1 capsule (80 mg total) by mouth at bedtime.    Dispense:  90 capsule    Refill:  1   meloxicam (MOBIC) 7.5 MG tablet    Sig: Take 1 tablet (7.5 mg total) by mouth daily.    Dispense:  30 tablet    Refill:  3   lisinopril (ZESTRIL) 40 MG tablet  Sig: Take 1 tablet (40 mg total) by mouth daily.    Dispense:  90 tablet    Refill:  1   isosorbide mononitrate (IMDUR) 30 MG 24 hr tablet    Sig: Take 1 tablet (30 mg total) by mouth daily.    Dispense:  90 tablet    Refill:  1   hydrOXYzine (ATARAX/VISTARIL) 25 MG tablet    Sig: Take 1 tablet (25 mg total) by mouth 3 (three) times daily as needed.    Dispense:  90 tablet    Refill:  3   escitalopram (LEXAPRO) 20 MG tablet    Sig: Take 1 tablet (20 mg total) by mouth daily.    Dispense:  90 tablet    Refill:  1     atorvastatin (LIPITOR) 20 MG tablet    Sig: Take 1 tablet (20 mg total) by mouth daily.    Dispense:  90 tablet    Refill:  1   amLODipine (NORVASC) 10 MG tablet    Sig: Take 1 tablet (10 mg total) by mouth daily.    Dispense:  90 tablet    Refill:  1    Follow-up: Return in about 6 months (around 07/08/2020) for Chronic disease management.       Charlott Rakes, MD, FAAFP. [facility] and [place], [place]   01/09/2020, 4:58 PM

## 2020-01-09 NOTE — Patient Instructions (Signed)

## 2020-01-10 LAB — CMP14+EGFR
ALT: 64 IU/L — ABNORMAL HIGH (ref 0–32)
AST: 75 IU/L — ABNORMAL HIGH (ref 0–40)
Albumin/Globulin Ratio: 1.6 (ref 1.2–2.2)
Albumin: 4.4 g/dL (ref 3.8–4.8)
Alkaline Phosphatase: 82 IU/L (ref 44–121)
BUN/Creatinine Ratio: 15 (ref 12–28)
BUN: 15 mg/dL (ref 8–27)
Bilirubin Total: 0.8 mg/dL (ref 0.0–1.2)
CO2: 23 mmol/L (ref 20–29)
Calcium: 9.3 mg/dL (ref 8.7–10.3)
Chloride: 101 mmol/L (ref 96–106)
Creatinine, Ser: 0.97 mg/dL (ref 0.57–1.00)
GFR calc Af Amer: 69 mL/min/{1.73_m2} (ref >59)
GFR calc non Af Amer: 60 mL/min/{1.73_m2} (ref >59)
Globulin, Total: 2.7 g/dL (ref 1.5–4.5)
Glucose: 83 mg/dL (ref 65–99)
Potassium: 3.5 mmol/L (ref 3.5–5.2)
Sodium: 137 mmol/L (ref 134–144)
Total Protein: 7.1 g/dL (ref 6.0–8.5)

## 2020-01-12 ENCOUNTER — Telehealth: Payer: Self-pay

## 2020-01-12 NOTE — Telephone Encounter (Signed)
-----   Message from Charlott Rakes, MD sent at 01/10/2020 12:17 PM EST ----- Labs reveal normal electrolytes, slightly elevated liver enzymes which is stable compared to previous. No additional changes need to be made at this time

## 2020-01-12 NOTE — Telephone Encounter (Signed)
Patient name and DOB has been verified Patient was informed of lab results. Patient had no questions.  

## 2020-01-17 MED FILL — PROPRANOLOL HCL ER 80 MG CP: 80 | 90 days supply | Qty: 90 | Fill #0

## 2020-01-17 MED FILL — ESCITALOPRAM 20 MG TABLET: 20 | 90 days supply | Qty: 90 | Fill #0

## 2020-01-17 MED FILL — ATORVASTATIN CALCIUM 20 MG: 20 | 90 days supply | Qty: 90 | Fill #0

## 2020-01-17 MED FILL — AMLODIPINE BESYLATE 10 MG T: 10 | 90 days supply | Qty: 90 | Fill #0

## 2020-01-17 MED FILL — ISOSORBIDE MN ER 30 MG TAB: 30 | 90 days supply | Qty: 90 | Fill #0

## 2020-01-17 MED FILL — hydrOXYzine HCL 25 MG TABS: 25 | 90 days supply | Qty: 270 | Fill #0

## 2020-01-17 MED FILL — LISINOPRIL 40 MG TABLET: 40 | 90 days supply | Qty: 90 | Fill #0

## 2020-01-17 MED FILL — traZODone HCL 50 MG TABS: 50 | 90 days supply | Qty: 90 | Fill #0

## 2020-04-23 MED FILL — LISINOPRIL 40 MG TAB: 40 | 90 days supply | Qty: 90 | Fill #1

## 2020-04-23 MED FILL — ESCITALOPRAM 20 MG TABLET: 20 | 90 days supply | Qty: 90 | Fill #1

## 2020-04-23 MED FILL — ISOSORBIDE MN ER 30 MG TAB: 30 | 90 days supply | Qty: 90 | Fill #1

## 2020-04-23 MED FILL — AMLODIPINE BESYLATE 10 MG T: 10 | 90 days supply | Qty: 90 | Fill #1

## 2020-04-23 MED FILL — ATORVASTATIN CALCIUM 20 MG: 20 | 90 days supply | Qty: 90 | Fill #1

## 2020-04-23 MED FILL — PROPRANOLOL HCL ER 80 MG CP: 80 | 90 days supply | Qty: 90 | Fill #1

## 2020-04-23 MED FILL — MELOXICAM 7.5 MG TABLET: 7.5 | 30 days supply | Qty: 30 | Fill #1

## 2020-04-23 MED FILL — hydrOXYzine HCL 25 MG TABS: 25 | 30 days supply | Qty: 90 | Fill #1

## 2020-04-23 MED FILL — traZODone HCL 50 MG TABS: 50 | 90 days supply | Qty: 90 | Fill #1

## 2020-07-08 ENCOUNTER — Encounter: Payer: Self-pay | Admitting: Family Medicine

## 2020-07-08 ENCOUNTER — Other Ambulatory Visit: Payer: Self-pay

## 2020-07-08 ENCOUNTER — Ambulatory Visit: Payer: Medicare Other | Attending: Family Medicine | Admitting: Family Medicine

## 2020-07-08 VITALS — BP 124/66 | HR 72 | Ht 63.0 in | Wt 161.8 lb

## 2020-07-08 DIAGNOSIS — E7800 Pure hypercholesterolemia, unspecified: Secondary | ICD-10-CM

## 2020-07-08 DIAGNOSIS — F32A Depression, unspecified: Secondary | ICD-10-CM

## 2020-07-08 DIAGNOSIS — M19041 Primary osteoarthritis, right hand: Secondary | ICD-10-CM | POA: Diagnosis not present

## 2020-07-08 DIAGNOSIS — M19042 Primary osteoarthritis, left hand: Secondary | ICD-10-CM

## 2020-07-08 DIAGNOSIS — Z1211 Encounter for screening for malignant neoplasm of colon: Secondary | ICD-10-CM

## 2020-07-08 DIAGNOSIS — F419 Anxiety disorder, unspecified: Secondary | ICD-10-CM | POA: Diagnosis not present

## 2020-07-08 DIAGNOSIS — Z1231 Encounter for screening mammogram for malignant neoplasm of breast: Secondary | ICD-10-CM

## 2020-07-08 DIAGNOSIS — I10 Essential (primary) hypertension: Secondary | ICD-10-CM

## 2020-07-08 DIAGNOSIS — E2839 Other primary ovarian failure: Secondary | ICD-10-CM

## 2020-07-08 MED ORDER — LISINOPRIL 40 MG PO TABS
ORAL_TABLET | Freq: Every day | ORAL | 1 refills | Status: DC
  Filled 2020-07-08: qty 90, 90d supply, fill #0
  Filled 2020-11-14: qty 90, 90d supply, fill #1

## 2020-07-08 MED ORDER — MELOXICAM 7.5 MG PO TABS
ORAL_TABLET | Freq: Every day | ORAL | 3 refills | Status: DC
  Filled 2020-07-08: qty 30, 30d supply, fill #0
  Filled 2020-11-14: qty 30, 30d supply, fill #1
  Filled 2021-06-16: qty 30, 30d supply, fill #0

## 2020-07-08 MED ORDER — ESCITALOPRAM OXALATE 20 MG PO TABS
ORAL_TABLET | Freq: Every day | ORAL | 1 refills | Status: DC
  Filled 2020-07-08: qty 90, 90d supply, fill #0
  Filled 2020-11-14: qty 90, 90d supply, fill #1

## 2020-07-08 MED ORDER — PROPRANOLOL HCL ER 80 MG PO CP24
ORAL_CAPSULE | ORAL | 1 refills | Status: DC
  Filled 2020-07-08: qty 90, 90d supply, fill #0
  Filled 2020-11-14: qty 90, 90d supply, fill #1

## 2020-07-08 MED ORDER — ISOSORBIDE MONONITRATE ER 30 MG PO TB24
30.0000 mg | ORAL_TABLET | Freq: Every day | ORAL | 1 refills | Status: DC
Start: 2020-07-08 — End: 2021-01-07
  Filled 2020-07-08: qty 90, 90d supply, fill #0
  Filled 2020-11-14: qty 90, 90d supply, fill #1

## 2020-07-08 MED ORDER — AMLODIPINE BESYLATE 10 MG PO TABS
ORAL_TABLET | Freq: Every day | ORAL | 1 refills | Status: DC
  Filled 2020-07-08: qty 90, 90d supply, fill #0
  Filled 2020-11-14: qty 90, 90d supply, fill #1

## 2020-07-08 MED ORDER — HYDROXYZINE HCL 25 MG PO TABS
ORAL_TABLET | Freq: Three times a day (TID) | ORAL | 3 refills | Status: DC | PRN
  Filled 2020-07-08: qty 90, 30d supply, fill #0
  Filled 2020-11-14: qty 90, 30d supply, fill #1

## 2020-07-08 MED ORDER — ATORVASTATIN CALCIUM 20 MG PO TABS
ORAL_TABLET | Freq: Every day | ORAL | 1 refills | Status: DC
  Filled 2020-07-08: qty 90, 90d supply, fill #0
  Filled 2020-11-14: qty 90, 90d supply, fill #1

## 2020-07-08 NOTE — Patient Instructions (Signed)
Exercise Information for Aging Adults Staying physically active is important as you age. The four types of exercises that are best for older adults are endurance, strength, balance, and flexibility. Contact your health care provider before you start any exercise routine. Ask your health care provider what activities are safe for you. What are the risks? Risks associated with exercising include:  Overdoing it. This may lead to sore muscles or fatigue.  Falls.  Injuries.  Dehydration. How to do these exercises Endurance exercises Endurance (aerobic) exercises raise your breathing rate and heart rate. Increasing your endurance helps you to do everyday tasks and stay healthy. By improving the health of your body system that includes your heart, lungs, and blood vessels (circulatory system), you may also delay or prevent diseases such as heart disease, diabetes, and bone loss (osteoporosis). Types of endurance exercises include:  Sports.  Indoor activities, such as using gym equipment, doing water aerobics, or dancing.  Outdoor activities, such as biking or jogging.  Tasks around the house, such as gardening, yard work, and heavy household chores like cleaning.  Walking, such as hiking or walking around your neighborhood. When doing endurance exercises, make sure you:  Are aware of your surroundings.  Use safety equipment as directed.  Dress in layers when exercising outdoors.  Drink plenty of water to stay well hydrated. Build up endurance slowly. Start with 10 minutes at a time, and gradually build up to doing 30 minutes at a time. Unless your health care provider gave you different instructions, aim to exercise for a total of 150 minutes a week. Spread out that time so you are working on endurance on 3 or more days a week.   Strength exercises Lifting, pulling, or pushing weights helps to strengthen muscles. Having stronger muscles makes it easier to do everyday activities, such as  getting up from a chair, climbing stairs, carrying groceries, and playing with grandchildren. Strength exercises include arm and leg exercises that may be done:  With weights.  Without weights (using your own body weight).  With a resistance band. When doing strength exercises:  Move smoothly and steadily. Do not suddenly thrust or jerk the weights, the resistance band, or your body.  Start with no weights or with light weights, and gradually add more weight over time. Eventually, aim to use weights that are hard or very hard for you to lift. This means that you are able to do 8 repetitions with the weight, and the last few repetitions are very challenging.  Lift or push weights into position for 3 seconds, hold the position for 1 second, and then take 3 seconds to return to your starting position.  Breathe out (exhale) during difficult movements, like lifting or pushing weights. Breathe in (inhale) to relax your muscles before the next repetition.  Consider alternating arms or legs, especially when you first start strength exercises.  Expect some slight muscle soreness after each session. Do strength exercises on 2 or more days a week, for 30 minutes at a time. Avoid exercising the same muscle groups two days in a row. For example, if you work on your leg muscles one day, work on your arm muscles the next day. When you can do two sets of 10-15 repetitions with a certain weight, increase the amount of weight.   Balance Balance exercises can help to prevent falls. Balance exercises include:  Standing on one foot.  Heel-to-toe walk.  Balance walk.  Tai chi. Make sure you have something sturdy to  hold onto while doing balance exercises, such as a sturdy chair. As your balance improves, challenge yourself by holding onto the chair with one hand instead of two, and then with no hands. Trying exercises with your eyes closed also challenges your balance, but be sure to have a sturdy surface  (like a countertop) close by in case you need it. Do balance exercises as often as you want, or as often as directed by your health care provider. Strength exercises for the lower body also help to improve balance. Flexibility Flexibility exercises improve how far you can bend, straighten, move, or rotate parts of your body (range of motion). These exercises also help you to do everyday activities such as getting dressed or reaching for objects. Flexibility exercises include stretching different parts of the body, and they may be done in a standing or seated position or on the floor. When stretching, make sure you:  Keep a slight bend in your arms and legs. Avoid completely straightening ("locking") your joints.  Do not stretch so far that you feel pain. You should feel a mild stretching feeling. You may try stretching farther as you become more flexible over time.  Relax and breathe between stretches.  Hold onto something sturdy for balance as needed. Hold each stretch for 10-30 seconds. Repeat each stretch 3-5 times.   General safety tips  Exercise in well-lit areas.  Do not hold your breath during exercises or stretches.  Warm up before exercising, and cool down after exercising. This can help prevent injury.  Drink plenty of water during exercise or any activity that makes you sweat.  Use smooth, steady movements. Do not use sudden, jerking movements, especially when lifting weights or doing flexibility exercises.  If you are not sure if an exercise is safe for you, or you are not sure how to do an exercise, talk with your health care provider. This is especially important if you have had surgery on muscles, bones, or joints (orthopedic surgery). Where to find more information You can find more information about exercise for older adults from:  Your local health department, fitness center, or community center. These facilities may have programs for aging adults.  [employer] on Aging: http://kim-miller.com/  National Council on Aging: www.ncoa.org Summary  Staying physically active is important as you age.  Make sure to contact your health care provider before you start any exercise routine. Ask your health care provider what activities are safe for you.  Doing endurance, strength, balance, and flexibility exercises can help to delay or prevent certain diseases, such as heart disease, diabetes, and bone loss (osteoporosis). This information is not intended to replace advice given to you by your health care provider. Make sure you discuss any questions you have with your health care provider. Document Revised: 06/07/2019 Document Reviewed: 06/07/2019 Elsevier Patient Education  2021 [employer].

## 2020-07-08 NOTE — Progress Notes (Signed)
Subjective:  Patient ID: [name], female    DOB: 1950-06-26  Age: 70 y.o. MRN: [id number]  CC: Hypertension   HPI [name] is a 70 year old female with a history of hypertension, anxiety and depression, history of breast cancer (status post left mastectomy over 16 years ago) osteoarthritis of the hand who presents today forafollow-up visit.  She walks 5 miles every other day and has lost 3 pounds since her last visit.  Her blood pressure is controlled and she is eating right.  Does not eat after 7 PM. Her anxiety and depression are controlled on her current regimen. She needs meloxicam for arthritis in her hands as on cold days she is noticing her bilateral fifth fingers get stiff and difficult to extend. She is not open to a colonoscopy but is willing to undergo Cologuard test to screen for colorectal cancer.  Past Medical History:  Diagnosis Date  . 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 Hepatitis C 07/17/2011   Positive Hepatitis C Antibody test and then confirmed with Hepatitis C Virus RNA by PCR.  Marland Kitchen Hypercholesteremia    dx 2010  . Hypertension    dx 1996  . Leukopenia 07/10/2011   Stable compared to 2012    Past Surgical History:  Procedure Laterality Date  . ABDOMINAL HYSTERECTOMY     late 1990s.  Marland Kitchen BREAST SURGERY Left    2004  . MASTECTOMY  2005   left side    Family History  Problem Relation Age of Onset  . Hypertension Mother   . Arthritis Sister        RA    Allergies  Allergen Reactions  . Fish Allergy Shortness Of Breath  . Strawberry Extract Shortness Of Breath  . Aspirin Nausea Only    Outpatient Medications Prior to Visit  Medication Sig Dispense Refill  . acetaminophen (TYLENOL) 325 MG tablet Take 2 tablets (650 mg total) by mouth every 6 (six) hours as needed. 30 tablet 0  . cetirizine (ZYRTEC) 10 MG tablet Take 1 tablet (10 mg total) by mouth daily. 30  tablet 6  . Elastic Bandages & Supports (WRIST SPLINT/ELASTIC LEFT MED) MISC 1 each by Does not apply route daily. 1 each 0  . Elastic Bandages & Supports (WRIST SPLINT/ELASTIC RIGHT MED) MISC 1 each by Does not apply route daily. 1 each 0  . traZODone (DESYREL) 50 MG tablet TAKE 1 TABLET (50 MG TOTAL) BY MOUTH AT BEDTIME. 90 tablet 1  . amLODipine (NORVASC) 10 MG tablet TAKE 1 TABLET (10 MG TOTAL) BY MOUTH DAILY. 90 tablet 1  . atorvastatin (LIPITOR) 20 MG tablet TAKE 1 TABLET (20 MG TOTAL) BY MOUTH DAILY. 90 tablet 1  . escitalopram (LEXAPRO) 20 MG tablet TAKE 1 TABLET (20 MG TOTAL) BY MOUTH DAILY. 90 tablet 1  . hydrOXYzine (ATARAX/VISTARIL) 25 MG tablet TAKE 1 TABLET (25 MG TOTAL) BY MOUTH 3 (THREE) TIMES DAILY AS NEEDED. 90 tablet 3  . isosorbide mononitrate (IMDUR) 30 MG 24 hr tablet TAKE 1 TABLET (30 MG TOTAL) BY MOUTH DAILY. 90 tablet 1  . lisinopril (ZESTRIL) 40 MG tablet TAKE 1 TABLET (40 MG TOTAL) BY MOUTH DAILY. 90 tablet 1  . meloxicam (MOBIC) 7.5 MG tablet TAKE 1 TABLET (7.5 MG TOTAL) BY MOUTH DAILY. 30 tablet 3  . propranolol ER (INDERAL LA) 80 MG 24 hr capsule TAKE 1 CAPSULE (80 MG TOTAL) BY MOUTH AT  BEDTIME. 90 capsule 1   No facility-administered medications prior to visit.     ROS Review of Systems  Constitutional: Negative for activity change, appetite change and fatigue.  HENT: Negative for congestion, sinus pressure and sore throat.   Eyes: Negative for visual disturbance.  Respiratory: Negative for cough, chest tightness, shortness of breath and wheezing.   Cardiovascular: Negative for chest pain and palpitations.  Gastrointestinal: Negative for abdominal distention, abdominal pain and constipation.  Endocrine: Negative for polydipsia.  Genitourinary: Negative for dysuria and frequency.  Musculoskeletal: Positive for arthralgias. Negative for back pain.  Skin: Negative for rash.  Neurological: Negative for tremors, light-headedness and numbness.  Hematological:  Does not bruise/bleed easily.  Psychiatric/Behavioral: Negative for agitation and behavioral problems.    Objective:  BP 124/66   Pulse 72   Ht $R'5\' 3"'sh$  (1.6 m)   Wt 161 lb 12.8 oz (73.4 kg)   SpO2 100%   BMI 28.66 kg/m   BP/Weight 07/08/2020 72/07/2033 07/09/7414  Systolic BP 384 536 468  Diastolic BP 66 73 72  Wt. (Lbs) 161.8 164 164.6  BMI 28.66 29.05 29.16      Physical Exam Constitutional:      Appearance: She is well-developed.  Neck:     Vascular: No JVD.  Cardiovascular:     Rate and Rhythm: Normal rate.     Heart sounds: Normal heart sounds. No murmur heard.   Pulmonary:     Effort: Pulmonary effort is normal.     Breath sounds: Normal breath sounds. No wheezing or rales.  Chest:     Chest wall: No tenderness.  Abdominal:     General: Bowel sounds are normal. There is no distension.     Palpations: Abdomen is soft. There is no mass.     Tenderness: There is no abdominal tenderness.  Musculoskeletal:        General: Normal range of motion.     Right lower leg: No edema.     Left lower leg: No edema.     Comments: Bouchard's nodules in fingers. Able to make a fist bilaterally Unable to fully extend right fifth finger  Neurological:     Mental Status: She is alert and oriented to person, place, and time.  Psychiatric:        Mood and Affect: Mood normal.     CMP Latest Ref Rng & Units 01/09/2020 07/17/2019 04/18/2019  Glucose 65 - 99 mg/dL 83 110(H) 103(H)  BUN 8 - 27 mg/dL $Remove'15 14 16  'sbPeMxJ$ Creatinine 0.57 - 1.00 mg/dL 0.97 1.01(H) 1.26(H)  Sodium 134 - 144 mmol/L 137 137 143  Potassium 3.5 - 5.2 mmol/L 3.5 4.0 4.3  Chloride 96 - 106 mmol/L 101 97 102  CO2 20 - 29 mmol/L $RemoveB'23 20 24  'EqfTiXLh$ Calcium 8.7 - 10.3 mg/dL 9.3 9.6 9.7  Total Protein 6.0 - 8.5 g/dL 7.1 7.2 -  Total Bilirubin 0.0 - 1.2 mg/dL 0.8 0.8 -  Alkaline Phos 44 - 121 IU/L 82 84 -  AST 0 - 40 IU/L 75(H) 70(H) -  ALT 0 - 32 IU/L 64(H) 57(H) -    Lipid Panel     Component Value Date/Time   CHOL 158  07/17/2019 1047   TRIG 101 07/17/2019 1047   HDL 77 07/17/2019 1047   CHOLHDL 2.1 07/17/2019 1047   CHOLHDL 3.5 03/20/2014 1118   VLDL 20 03/20/2014 1118   LDLCALC 63 07/17/2019 1047    CBC    Component Value Date/Time   WBC  4.3 12/17/2016 1539   WBC 4.5 03/20/2014 1118   RBC 4.74 12/17/2016 1539   RBC 4.52 03/20/2014 1118   HGB 13.7 12/17/2016 1539   HCT 41.7 12/17/2016 1539   HCT 38 08/12/2011 1445   PLT 235 12/17/2016 1539   MCV 88 12/17/2016 1539   MCV 82.2 08/12/2011 1445   MCH 28.9 12/17/2016 1539   MCH 28.8 03/20/2014 1118   MCHC 32.9 12/17/2016 1539   MCHC 34.3 03/20/2014 1118   RDW 14.3 12/17/2016 1539   LYMPHSABS 2.1 12/17/2016 1539   MONOABS 0.3 07/10/2011 1327   EOSABS 0.1 12/17/2016 1539   BASOSABS 0.0 12/17/2016 1539    Lab Results  Component Value Date   HGBA1C 5.40 04/19/2015    Assessment & Plan:  1. Essential hypertension Controlled Counseled on blood pressure goal of less than 130/80, low-sodium, DASH diet, medication compliance, 150 minutes of moderate intensity exercise per week. Discussed medication compliance, adverse effects. - CMP14+EGFR; Future - Lipid panel; Future - amLODipine (NORVASC) 10 MG tablet; TAKE 1 TABLET (10 MG TOTAL) BY MOUTH DAILY.  Dispense: 90 tablet; Refill: 1 - isosorbide mononitrate (IMDUR) 30 MG 24 hr tablet; Take 1 tablet (30 mg total) by mouth daily.  Dispense: 90 tablet; Refill: 1 - lisinopril (ZESTRIL) 40 MG tablet; TAKE 1 TABLET (40 MG TOTAL) BY MOUTH DAILY.  Dispense: 90 tablet; Refill: 1 - propranolol ER (INDERAL LA) 80 MG 24 hr capsule; TAKE 1 CAPSULE (80 MG TOTAL) BY MOUTH AT BEDTIME.  Dispense: 90 capsule; Refill: 1  2. Hypercholesteremia Controlled Low-cholesterol diet - atorvastatin (LIPITOR) 20 MG tablet; TAKE 1 TABLET (20 MG TOTAL) BY MOUTH DAILY.  Dispense: 90 tablet; Refill: 1  3. Anxiety and depression Stable - escitalopram (LEXAPRO) 20 MG tablet; TAKE 1 TABLET (20 MG TOTAL) BY MOUTH DAILY.   Dispense: 90 tablet; Refill: 1 - hydrOXYzine (ATARAX/VISTARIL) 25 MG tablet; TAKE 1 TABLET (25 MG TOTAL) BY MOUTH 3 (THREE) TIMES DAILY AS NEEDED.  Dispense: 90 tablet; Refill: 3 - propranolol ER (INDERAL LA) 80 MG 24 hr capsule; TAKE 1 CAPSULE (80 MG TOTAL) BY MOUTH AT BEDTIME.  Dispense: 90 capsule; Refill: 1  4. Primary osteoarthritis of both hands Controlled on meloxicam Advised to exercise the hands and also to use OTC analgesic regimen as needed - meloxicam (MOBIC) 7.5 MG tablet; TAKE 1 TABLET (7.5 MG TOTAL) BY MOUTH DAILY.  Dispense: 30 tablet; Refill: 3  5. Screening for colon cancer - Cologuard  6. Estrogen deficiency - DG Bone Density; Future  7. Encounter for screening mammogram for malignant neoplasm of breast - MM 3D SCREEN BREAST BILATERAL; Future    Meds ordered this encounter  Medications  . amLODipine (NORVASC) 10 MG tablet    Sig: TAKE 1 TABLET (10 MG TOTAL) BY MOUTH DAILY.    Dispense:  90 tablet    Refill:  1  . atorvastatin (LIPITOR) 20 MG tablet    Sig: TAKE 1 TABLET (20 MG TOTAL) BY MOUTH DAILY.    Dispense:  90 tablet    Refill:  1  . escitalopram (LEXAPRO) 20 MG tablet    Sig: TAKE 1 TABLET (20 MG TOTAL) BY MOUTH DAILY.    Dispense:  90 tablet    Refill:  1  . hydrOXYzine (ATARAX/VISTARIL) 25 MG tablet    Sig: TAKE 1 TABLET (25 MG TOTAL) BY MOUTH 3 (THREE) TIMES DAILY AS NEEDED.    Dispense:  90 tablet    Refill:  3  . isosorbide mononitrate (IMDUR)  30 MG 24 hr tablet    Sig: Take 1 tablet (30 mg total) by mouth daily.    Dispense:  90 tablet    Refill:  1  . lisinopril (ZESTRIL) 40 MG tablet    Sig: TAKE 1 TABLET (40 MG TOTAL) BY MOUTH DAILY.    Dispense:  90 tablet    Refill:  1  . meloxicam (MOBIC) 7.5 MG tablet    Sig: TAKE 1 TABLET (7.5 MG TOTAL) BY MOUTH DAILY.    Dispense:  30 tablet    Refill:  3  . propranolol ER (INDERAL LA) 80 MG 24 hr capsule    Sig: TAKE 1 CAPSULE (80 MG TOTAL) BY MOUTH AT BEDTIME.    Dispense:  90 capsule     Refill:  1    Follow-up: Return in about 6 months (around 01/08/2021) for Chronic disease management.       Charlott Rakes, MD, FAAFP. [facility] [place], [place]   07/08/2020, 4:48 PM

## 2020-07-09 ENCOUNTER — Other Ambulatory Visit: Payer: Self-pay

## 2020-07-15 ENCOUNTER — Ambulatory Visit: Payer: Medicare Other | Attending: Family Medicine

## 2020-07-15 ENCOUNTER — Other Ambulatory Visit: Payer: Self-pay

## 2020-07-15 DIAGNOSIS — I10 Essential (primary) hypertension: Secondary | ICD-10-CM

## 2020-07-16 ENCOUNTER — Telehealth: Payer: Self-pay | Admitting: Family Medicine

## 2020-07-16 LAB — CMP14+EGFR
ALT: 44 IU/L — ABNORMAL HIGH (ref 0–32)
AST: 47 IU/L — ABNORMAL HIGH (ref 0–40)
Albumin/Globulin Ratio: 1.5 (ref 1.2–2.2)
Albumin: 4.3 g/dL (ref 3.8–4.8)
Alkaline Phosphatase: 70 IU/L (ref 44–121)
BUN/Creatinine Ratio: 15 (ref 12–28)
BUN: 13 mg/dL (ref 8–27)
Bilirubin Total: 0.4 mg/dL (ref 0.0–1.2)
CO2: 22 mmol/L (ref 20–29)
Calcium: 9.5 mg/dL (ref 8.7–10.3)
Chloride: 101 mmol/L (ref 96–106)
Creatinine, Ser: 0.87 mg/dL (ref 0.57–1.00)
Globulin, Total: 2.9 g/dL (ref 1.5–4.5)
Glucose: 117 mg/dL — ABNORMAL HIGH (ref 65–99)
Potassium: 4.5 mmol/L (ref 3.5–5.2)
Sodium: 138 mmol/L (ref 134–144)
Total Protein: 7.2 g/dL (ref 6.0–8.5)
eGFR: 72 mL/min/{1.73_m2} (ref >59)

## 2020-07-16 LAB — LIPID PANEL
Chol/HDL Ratio: 2.4 ratio (ref 0.0–4.4)
Cholesterol, Total: 153 mg/dL (ref 100–199)
HDL: 64 mg/dL (ref >39)
LDL Chol Calc (NIH): 70 mg/dL (ref 0–99)
Triglycerides: 104 mg/dL (ref 0–149)
VLDL Cholesterol Cal: 19 mg/dL (ref 5–40)

## 2020-07-16 NOTE — Telephone Encounter (Signed)
Copied from [place] [id number]. Topic: General - Other >> Jul 16, 2020 10:23 AM Leward Quan A wrote: Reason for CRM: Patient called in to inform Dr Julienne Kass that she was in the company on 07/15/20 of someone that tested positive for Covid on 07/16/20. Per patient she was wearing a mask while with this person does not have any symptoms but is afraid and want to know if Dr Margarita Rana can order a Covid test for her at the office. Can be reached at  Ph# [phone]

## 2020-07-17 ENCOUNTER — Other Ambulatory Visit: Payer: Medicare Other

## 2020-07-17 NOTE — Telephone Encounter (Signed)
Call placed to patient and VM was left informing patient that she can get her test done here.

## 2020-07-18 ENCOUNTER — Other Ambulatory Visit: Payer: Medicare Other

## 2020-07-19 ENCOUNTER — Ambulatory Visit: Payer: Medicare Other | Attending: Critical Care Medicine

## 2020-07-19 DIAGNOSIS — Z20822 Contact with and (suspected) exposure to covid-19: Secondary | ICD-10-CM

## 2020-07-20 LAB — SARS-COV-2, NAA 2 DAY TAT

## 2020-07-20 LAB — NOVEL CORONAVIRUS, NAA: SARS-CoV-2, NAA: NOT DETECTED

## 2020-07-24 NOTE — Telephone Encounter (Signed)
Pt went to location in [name] and she is aware her results is neg on 07-24-2020

## 2020-11-03 ENCOUNTER — Telehealth: Payer: Self-pay

## 2020-11-03 NOTE — Telephone Encounter (Signed)
LVM to call office to schedule AWV   Please schedule on [employer] schedule

## 2020-11-14 ENCOUNTER — Other Ambulatory Visit: Payer: Self-pay

## 2020-11-14 ENCOUNTER — Other Ambulatory Visit: Payer: Self-pay | Admitting: Family Medicine

## 2020-11-14 DIAGNOSIS — F32A Depression, unspecified: Secondary | ICD-10-CM

## 2020-11-14 MED ORDER — TRAZODONE HCL 50 MG PO TABS
ORAL_TABLET | Freq: Every day | ORAL | 0 refills | Status: DC
  Filled 2020-11-14: qty 90, 90d supply, fill #0

## 2020-11-14 NOTE — Telephone Encounter (Signed)
Future visit in 1 month  

## 2020-11-15 ENCOUNTER — Other Ambulatory Visit: Payer: Self-pay

## 2020-11-19 ENCOUNTER — Other Ambulatory Visit: Payer: Self-pay

## 2020-11-19 ENCOUNTER — Other Ambulatory Visit (HOSPITAL_COMMUNITY): Payer: Self-pay

## 2020-11-20 ENCOUNTER — Other Ambulatory Visit: Payer: Self-pay

## 2021-01-03 ENCOUNTER — Telehealth (INDEPENDENT_AMBULATORY_CARE_PROVIDER_SITE_OTHER): Payer: Self-pay

## 2021-01-03 NOTE — Telephone Encounter (Signed)
FYI.    Copied from [place] [id number]. Topic: General - Other >> Jan 01, 2021 10:54 AM Leward Quan A wrote: Reason for CRM: Reshma with [facility] called in to inform Dr Margarita Rana that patient had a Hep C test done and it did come back as reactive. Will fax over a copy of this test for record Ph# [phone]

## 2021-01-06 NOTE — Telephone Encounter (Signed)
FYI

## 2021-01-07 ENCOUNTER — Other Ambulatory Visit: Payer: Self-pay

## 2021-01-07 ENCOUNTER — Ambulatory Visit: Payer: Medicare Other | Attending: Family Medicine | Admitting: Family Medicine

## 2021-01-07 ENCOUNTER — Encounter: Payer: Self-pay | Admitting: Family Medicine

## 2021-01-07 VITALS — BP 146/75 | HR 60 | Ht 63.0 in | Wt 165.0 lb

## 2021-01-07 DIAGNOSIS — F419 Anxiety disorder, unspecified: Secondary | ICD-10-CM | POA: Diagnosis not present

## 2021-01-07 DIAGNOSIS — E7800 Pure hypercholesterolemia, unspecified: Secondary | ICD-10-CM

## 2021-01-07 DIAGNOSIS — F32A Depression, unspecified: Secondary | ICD-10-CM

## 2021-01-07 DIAGNOSIS — Z1159 Encounter for screening for other viral diseases: Secondary | ICD-10-CM

## 2021-01-07 DIAGNOSIS — M19042 Primary osteoarthritis, left hand: Secondary | ICD-10-CM

## 2021-01-07 DIAGNOSIS — I10 Essential (primary) hypertension: Secondary | ICD-10-CM | POA: Diagnosis not present

## 2021-01-07 DIAGNOSIS — M19041 Primary osteoarthritis, right hand: Secondary | ICD-10-CM

## 2021-01-07 MED ORDER — TRAZODONE HCL 50 MG PO TABS
ORAL_TABLET | Freq: Every day | ORAL | 1 refills | Status: DC
  Filled 2021-01-07: qty 90, fill #0

## 2021-01-07 MED ORDER — ATORVASTATIN CALCIUM 20 MG PO TABS
ORAL_TABLET | Freq: Every day | ORAL | 1 refills | Status: DC
  Filled 2021-01-07: qty 90, fill #0

## 2021-01-07 MED ORDER — PROPRANOLOL HCL ER 80 MG PO CP24
ORAL_CAPSULE | ORAL | 1 refills | Status: DC
  Filled 2021-01-07: qty 90, fill #0
  Filled 2021-03-03 – 2021-06-16 (×4): qty 90, 90d supply, fill #0

## 2021-01-07 MED ORDER — LISINOPRIL 40 MG PO TABS
ORAL_TABLET | Freq: Every day | ORAL | 1 refills | Status: DC
  Filled 2021-01-07: qty 90, fill #0

## 2021-01-07 MED ORDER — ESCITALOPRAM OXALATE 20 MG PO TABS
ORAL_TABLET | Freq: Every day | ORAL | 1 refills | Status: DC
  Filled 2021-01-07: qty 90, fill #0
  Filled 2021-03-03 – 2021-06-18 (×3): qty 90, 90d supply, fill #0

## 2021-01-07 MED ORDER — PREDNISONE 20 MG PO TABS
20.0000 mg | ORAL_TABLET | Freq: Every day | ORAL | 0 refills | Status: DC
  Filled 2021-01-07: qty 5, 5d supply, fill #0

## 2021-01-07 MED ORDER — ISOSORBIDE MONONITRATE ER 30 MG PO TB24
30.0000 mg | ORAL_TABLET | Freq: Every day | ORAL | 1 refills | Status: DC
  Filled 2021-01-07: qty 90, 90d supply, fill #0

## 2021-01-07 MED ORDER — AMLODIPINE BESYLATE 10 MG PO TABS
ORAL_TABLET | Freq: Every day | ORAL | 1 refills | Status: DC
  Filled 2021-01-07: qty 90, fill #0

## 2021-01-07 MED ORDER — TRAMADOL HCL 50 MG PO TABS
50.0000 mg | ORAL_TABLET | Freq: Every evening | ORAL | 1 refills | Status: AC | PRN
Start: 2021-01-07 — End: 2021-02-06
  Filled 2021-01-07: qty 7, 7d supply, fill #0

## 2021-01-07 NOTE — Progress Notes (Addendum)
Subjective:  Patient ID: [name], female    DOB: 1951/01/23  Age: 70 y.o. MRN: [id number]  CC: Hypertension   HPI [name] is a 70 y.o. year old female with a history of hypertension, anxiety and depression, history of breast cancer (status post left mastectomy over 16 years ago) osteoarthritis of the hand who presents today for a follow-up visit.  Interval History: She complains of pain in her L thenar emminence and dorsum of right hand. Pain is also in the knees right greater than left and symptoms have worsened since the weather became cold.  Meloxicam has been ineffective   Message received from [employer] that she tested positive for Hep C. She endorses compliance with her antihypertensive and her anxiolytic.  Her blood pressure is slightly elevated today. 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   Hypercholesteremia    dx 2010   Hypertension    dx 1996   Leukopenia 07/10/2011   Stable compared to 2012    Past Surgical History:  Procedure Laterality Date   ABDOMINAL HYSTERECTOMY     late 1990s.   BREAST SURGERY Left    2004   MASTECTOMY  2005   left side    Family History  Problem Relation Age of Onset   Hypertension Mother    Arthritis Sister        RA    Allergies  Allergen Reactions   Fish Allergy Shortness Of Breath   Strawberry Extract Shortness Of Breath   Aspirin Nausea Only    Outpatient Medications Prior to Visit  Medication Sig Dispense Refill   acetaminophen (TYLENOL) 325 MG tablet Take 2 tablets (650 mg total) by mouth every 6 (six) hours as needed. 30 tablet 0   cetirizine (ZYRTEC) 10 MG tablet Take 1 tablet (10 mg total) by mouth daily. 30 tablet 6   Elastic Bandages & Supports (WRIST  SPLINT/ELASTIC LEFT MED) MISC 1 each by Does not apply route daily. 1 each 0   Elastic Bandages & Supports (WRIST SPLINT/ELASTIC RIGHT MED) MISC 1 each by Does not apply route daily. 1 each 0   hydrOXYzine (ATARAX/VISTARIL) 25 MG tablet TAKE 1 TABLET (25 MG TOTAL) BY MOUTH 3 (THREE) TIMES DAILY AS NEEDED. 90 tablet 3   meloxicam (MOBIC) 7.5 MG tablet TAKE 1 TABLET (7.5 MG TOTAL) BY MOUTH DAILY. 30 tablet 3   amLODipine (NORVASC) 10 MG tablet TAKE 1 TABLET (10 MG TOTAL) BY MOUTH DAILY. 90 tablet 1   atorvastatin (LIPITOR) 20 MG tablet TAKE 1 TABLET (20 MG TOTAL) BY MOUTH DAILY. 90 tablet 1   escitalopram (LEXAPRO) 20 MG tablet TAKE 1 TABLET (20 MG TOTAL) BY MOUTH DAILY. 90 tablet 1   isosorbide mononitrate (IMDUR) 30 MG 24 hr tablet Take 1 tablet (30 mg total) by mouth daily. 90 tablet 1   lisinopril (ZESTRIL) 40 MG tablet TAKE 1 TABLET (40 MG TOTAL) BY MOUTH DAILY. 90 tablet 1   propranolol ER (INDERAL LA) 80 MG 24 hr capsule TAKE 1 CAPSULE (80 MG TOTAL) BY MOUTH AT BEDTIME. 90 capsule 1   traZODone (DESYREL) 50 MG tablet TAKE 1 TABLET (50 MG TOTAL) BY MOUTH AT BEDTIME. 90 tablet 0   No facility-administered medications prior to visit.  ROS Review of Systems  Constitutional:  Negative for activity change, appetite change and fatigue.  HENT:  Negative for congestion, sinus pressure and sore throat.   Eyes:  Negative for visual disturbance.  Respiratory:  Negative for cough, chest tightness, shortness of breath and wheezing.   Cardiovascular:  Negative for chest pain and palpitations.  Gastrointestinal:  Negative for abdominal distention, abdominal pain and constipation.  Endocrine: Negative for polydipsia.  Genitourinary:  Negative for dysuria and frequency.  Musculoskeletal:        See HPI  Skin:  Negative for rash.  Neurological:  Negative for tremors, light-headedness and numbness.  Hematological:  Does not bruise/bleed easily.  Psychiatric/Behavioral:  Negative for agitation and  behavioral problems.    Objective:  BP (!) 146/75   Pulse 60   Ht _0  (1.6 m)   Wt 165 lb (74.8 kg)   SpO2 100%   BMI 29.23 kg/m   BP/Weight 01/07/2021 07/08/2020 86/08/6193  Systolic BP 093 267 124  Diastolic BP 75 66 73  Wt. (Lbs) 165 161.8 164  BMI 29.23 28.66 29.05      Physical Exam Constitutional:      Appearance: She is well-developed.  Cardiovascular:     Rate and Rhythm: Normal rate.     Heart sounds: Normal heart sounds. No murmur heard. Pulmonary:     Effort: Pulmonary effort is normal.     Breath sounds: Normal breath sounds. No wheezing or rales.  Chest:     Chest wall: No tenderness.  Abdominal:     General: Bowel sounds are normal. There is no distension.     Palpations: Abdomen is soft. There is no mass.     Tenderness: There is no abdominal tenderness.  Musculoskeletal:        General: Normal range of motion.     Right lower leg: No edema.     Left lower leg: No edema.     Comments: Tenderness on palpation of dorsal aspect of right MCP joint.  Tenderness on palpation of left dorsal aspect of thenar space Tenderness in right knee range of motion; left knee is normal  Neurological:     Mental Status: She is alert and oriented to person, place, and time.  Psychiatric:        Mood and Affect: Mood normal.    CMP Latest Ref Rng & Units 07/15/2020 01/09/2020 07/17/2019  Glucose 65 - 99 mg/dL 117(H) 83 110(H)  BUN 8 - 27 mg/dL _1 Creatinine 0.57 - 1.00 mg/dL 0.87 0.97 1.01(H)  Sodium 134 - 144 mmol/L 138 137 137  Potassium 3.5 - 5.2 mmol/L 4.5 3.5 4.0  Chloride 96 - 106 mmol/L 101 101 97  CO2 20 - 29 mmol/L _2 Calcium 8.7 - 10.3 mg/dL 9.5 9.3 9.6  Total Protein 6.0 - 8.5 g/dL 7.2 7.1 7.2  Total Bilirubin 0.0 - 1.2 mg/dL 0.4 0.8 0.8  Alkaline Phos 44 - 121 IU/L 70 82 84  AST 0 - 40 IU/L 47(H) 75(H) 70(H)  ALT 0 - 32 IU/L 44(H) 64(H) 57(H)    Lipid Panel     Component Value Date/Time   CHOL 153 07/15/2020 0854   TRIG 104 07/15/2020  0854   HDL 64 07/15/2020 0854   CHOLHDL 2.4 07/15/2020 0854   CHOLHDL 3.5 03/20/2014 1118   VLDL 20 03/20/2014 1118   LDLCALC 70 07/15/2020 0854    CBC    Component Value Date/Time   WBC 4.3  12/17/2016 1539   WBC 4.5 03/20/2014 1118   RBC 4.74 12/17/2016 1539   RBC 4.52 03/20/2014 1118   HGB 13.7 12/17/2016 1539   HCT 41.7 12/17/2016 1539   HCT 38 08/12/2011 1445   PLT 235 12/17/2016 1539   MCV 88 12/17/2016 1539   MCV 82.2 08/12/2011 1445   MCH 28.9 12/17/2016 1539   MCH 28.8 03/20/2014 1118   MCHC 32.9 12/17/2016 1539   MCHC 34.3 03/20/2014 1118   RDW 14.3 12/17/2016 1539   LYMPHSABS 2.1 12/17/2016 1539   MONOABS 0.3 07/10/2011 1327   EOSABS 0.1 12/17/2016 1539   BASOSABS 0.0 12/17/2016 1539    Lab Results  Component Value Date   HGBA1C 5.40 04/19/2015    Assessment & Plan:  1. Anxiety and depression Controlled - traZODone (DESYREL) 50 MG tablet; TAKE 1 TABLET (50 MG TOTAL) BY MOUTH AT BEDTIME.  Dispense: 90 tablet; Refill: 1 - propranolol ER (INDERAL LA) 80 MG 24 hr capsule; TAKE 1 CAPSULE (80 MG TOTAL) BY MOUTH AT BEDTIME.  Dispense: 90 capsule; Refill: 1 - escitalopram (LEXAPRO) 20 MG tablet; TAKE 1 TABLET (20 MG TOTAL) BY MOUTH DAILY.  Dispense: 90 tablet; Refill: 1  2. Essential hypertension Slightly above goal No regimen change today but she has been advised to work on lifestyle modifications If still elevated at next visit will adjust regimen Counseled on blood pressure goal of less than 130/80, low-sodium, DASH diet, medication compliance, 150 minutes of moderate intensity exercise per week. Discussed medication compliance, adverse effects. - CMP14+EGFR - propranolol ER (INDERAL LA) 80 MG 24 hr capsule; TAKE 1 CAPSULE (80 MG TOTAL) BY MOUTH AT BEDTIME.  Dispense: 90 capsule; Refill: 1 - lisinopril (ZESTRIL) 40 MG tablet; TAKE 1 TABLET (40 MG TOTAL) BY MOUTH DAILY.  Dispense: 90 tablet; Refill: 1 - isosorbide mononitrate (IMDUR) 30 MG 24 hr tablet; Take 1  tablet (30 mg total) by mouth daily.  Dispense: 90 tablet; Refill: 1 - amLODipine (NORVASC) 10 MG tablet; TAKE 1 TABLET (10 MG TOTAL) BY MOUTH DAILY.  Dispense: 90 tablet; Refill: 1  3. Hypercholesteremia Controlled - atorvastatin (LIPITOR) 20 MG tablet; TAKE 1 TABLET (20 MG TOTAL) BY MOUTH DAILY.  Dispense: 90 tablet; Refill: 1  4. Need for hepatitis C screening test - HCV Ab w Reflex to Quant PCR  5. Primary osteoarthritis of both hands Uncontrolled Also starting to exhibit symptoms of osteoarthritis of the knee Short course of prednisone Continue meloxicam once prednisone is completed We will add on tramadol to be used as needed for severe pain - predniSONE (DELTASONE) 20 MG tablet; Take 1 tablet (20 mg total) by mouth daily with breakfast.  Dispense: 5 tablet; Refill: 0 - traMADol (ULTRAM) 50 MG tablet; Take 1 tablet (50 mg total) by mouth at bedtime as needed.  Dispense: 30 tablet; Refill: 1   Health Care Maintenance: We will recheck hepatitis C screening again given report from insurance company-[employer] Meds ordered this encounter  Medications   predniSONE (DELTASONE) 20 MG tablet    Sig: Take 1 tablet (20 mg total) by mouth daily with breakfast.    Dispense:  5 tablet    Refill:  0   traZODone (DESYREL) 50 MG tablet    Sig: TAKE 1 TABLET (50 MG TOTAL) BY MOUTH AT BEDTIME.    Dispense:  90 tablet    Refill:  1   propranolol ER (INDERAL LA) 80 MG 24 hr capsule    Sig: TAKE 1 CAPSULE (80 MG TOTAL)  BY MOUTH AT BEDTIME.    Dispense:  90 capsule    Refill:  1   lisinopril (ZESTRIL) 40 MG tablet    Sig: TAKE 1 TABLET (40 MG TOTAL) BY MOUTH DAILY.    Dispense:  90 tablet    Refill:  1   isosorbide mononitrate (IMDUR) 30 MG 24 hr tablet    Sig: Take 1 tablet (30 mg total) by mouth daily.    Dispense:  90 tablet    Refill:  1   escitalopram (LEXAPRO) 20 MG tablet    Sig: TAKE 1 TABLET (20 MG TOTAL) BY MOUTH DAILY.    Dispense:  90 tablet    Refill:  1    atorvastatin (LIPITOR) 20 MG tablet    Sig: TAKE 1 TABLET (20 MG TOTAL) BY MOUTH DAILY.    Dispense:  90 tablet    Refill:  1   amLODipine (NORVASC) 10 MG tablet    Sig: TAKE 1 TABLET (10 MG TOTAL) BY MOUTH DAILY.    Dispense:  90 tablet    Refill:  1   traMADol (ULTRAM) 50 MG tablet    Sig: Take 1 tablet (50 mg total) by mouth at bedtime as needed.    Dispense:  30 tablet    Refill:  1    Follow-up: Return in about 6 months (around 07/07/2021) for Medical conditions.       Charlott Rakes, MD, FAAFP. [facility] and [facility], [place]   01/07/2021, 9:17 AM

## 2021-01-07 NOTE — Telephone Encounter (Signed)
Addressed at office visit

## 2021-01-07 NOTE — Progress Notes (Signed)
Needs medication refills.

## 2021-01-09 LAB — CMP14+EGFR
ALT: 43 IU/L — ABNORMAL HIGH (ref 0–32)
AST: 43 IU/L — ABNORMAL HIGH (ref 0–40)
Albumin/Globulin Ratio: 1.8 (ref 1.2–2.2)
Albumin: 4.5 g/dL (ref 3.8–4.8)
Alkaline Phosphatase: 78 IU/L (ref 44–121)
BUN/Creatinine Ratio: 15 (ref 12–28)
BUN: 13 mg/dL (ref 8–27)
Bilirubin Total: 0.4 mg/dL (ref 0.0–1.2)
CO2: 25 mmol/L (ref 20–29)
Calcium: 10 mg/dL (ref 8.7–10.3)
Chloride: 102 mmol/L (ref 96–106)
Creatinine, Ser: 0.87 mg/dL (ref 0.57–1.00)
Globulin, Total: 2.5 g/dL (ref 1.5–4.5)
Glucose: 115 mg/dL — ABNORMAL HIGH (ref 70–99)
Potassium: 4.5 mmol/L (ref 3.5–5.2)
Sodium: 139 mmol/L (ref 134–144)
Total Protein: 7 g/dL (ref 6.0–8.5)
eGFR: 72 mL/min/{1.73_m2} (ref >59)

## 2021-01-09 LAB — HCV RT-PCR, QUANT (NON-GRAPH)
HCV log10: 6.964 log10 IU/mL
Hepatitis C Quantitation: 9200000 IU/mL

## 2021-01-09 LAB — HCV AB W REFLEX TO QUANT PCR: HCV Ab: >11 s/co ratio — ABNORMAL HIGH (ref 0.0–0.9)

## 2021-01-10 ENCOUNTER — Telehealth: Payer: Self-pay

## 2021-01-10 ENCOUNTER — Other Ambulatory Visit: Payer: Self-pay | Admitting: Family Medicine

## 2021-01-10 DIAGNOSIS — B182 Chronic viral hepatitis C: Secondary | ICD-10-CM

## 2021-01-10 NOTE — Telephone Encounter (Signed)
-----   Message from Charlott Rakes, MD sent at 01/10/2021  8:43 AM EST ----- Kidney and liver functions are stable.

## 2021-01-10 NOTE — Telephone Encounter (Signed)
Patient name and DOB has been verified Patient was informed of lab results. Patient had no questions.  

## 2021-01-10 NOTE — Telephone Encounter (Signed)
-----   Message from Charlott Rakes, MD sent at 01/10/2021  8:43 AM EST ----- Please inform her that she tested positive for hepatitis C.  I have referred her to infectious disease to discuss treatment

## 2021-02-26 ENCOUNTER — Other Ambulatory Visit: Payer: Self-pay | Admitting: Family Medicine

## 2021-02-26 DIAGNOSIS — I10 Essential (primary) hypertension: Secondary | ICD-10-CM

## 2021-02-26 DIAGNOSIS — F419 Anxiety disorder, unspecified: Secondary | ICD-10-CM

## 2021-02-26 DIAGNOSIS — E7800 Pure hypercholesterolemia, unspecified: Secondary | ICD-10-CM

## 2021-02-26 NOTE — Telephone Encounter (Signed)
Requested medication (s) are due for refill today:  early refills requested by patient   Requested medication (s) are on the active medication list: yes  Last refill:  01/07/21 -01/07/22 #90 1 refill- trazadone, lisinopril, norvasc, imdur. 07/08/20-07/08/21 #90 3 refills - atarax  Future visit scheduled: yes in 4 months  Notes to clinic:  [place] do you want to refill early?     Requested Prescriptions  Pending Prescriptions Disp Refills   traZODone (DESYREL) 50 MG tablet 90 tablet 1    Sig: TAKE 1 TABLET (50 MG TOTAL) BY MOUTH AT BEDTIME.     Psychiatry: Antidepressants - Serotonin Modulator Passed - 02/26/2021  3:21 PM      Passed - Completed PHQ-2 or PHQ-9 in the last 360 days      Passed - Valid encounter within last 6 months    Recent Outpatient Visits           1 month ago Need for hepatitis C screening test   [place] Charlott Rakes, MD   7 months ago Screening for colon cancer   [place] Charlott Rakes, MD   1 year ago Screening for colon cancer   [place] Charlott Rakes, MD   1 year ago Need for zoster vaccination   [place], Jarome Matin, RPH-CPP   1 year ago [place], Charlane Ferretti, MD       Future Appointments             In 4 months Charlott Rakes, MD [place]             lisinopril (ZESTRIL) 40 MG tablet 90 tablet 1    Sig: TAKE 1 TABLET (40 MG TOTAL) BY MOUTH DAILY.     Cardiovascular:  ACE Inhibitors Failed - 02/26/2021  3:21 PM      Failed - Last BP in normal range    BP Readings from Last 1 Encounters:  01/07/21 (!) 146/75          Passed - Cr in normal range and within 180 days    Creat  Date Value Ref Range Status  10/21/2015 0.90 0.50 - 0.99 mg/dL Final    Comment:      For patients > or = 69 years of age:  The upper reference limit for Creatinine is approximately 13% higher for people identified as African-American.      Creatinine, Ser  Date Value Ref Range Status  01/07/2021 0.87 0.57 - 1.00 mg/dL Final          Passed - K in normal range and within 180 days    Potassium  Date Value Ref Range Status  01/07/2021 4.5 3.5 - 5.2 mmol/L Final          Passed - Patient is not pregnant      Passed - Valid encounter within last 6 months    Recent Outpatient Visits           1 month ago Need for hepatitis C screening test   [place], Enobong, MD   7 months ago Screening for colon cancer   [place] Charlott Rakes, MD   1 year ago Screening for colon cancer   [place] Charlott Rakes, MD   1  year ago Need for zoster vaccination   [place], RPH-CPP   1 year ago [place], Enobong, MD       Future Appointments             In 4 months Charlott Rakes, MD [place]             amLODipine (NORVASC) 10 MG tablet 90 tablet 1    Sig: TAKE 1 TABLET (10 MG TOTAL) BY MOUTH DAILY.     Cardiovascular:  Calcium Channel Blockers Failed - 02/26/2021  3:21 PM      Failed - Last BP in normal range    BP Readings from Last 1 Encounters:  01/07/21 (!) 146/75          Passed - Valid encounter within last 6 months    Recent Outpatient Visits           1 month ago Need for hepatitis C screening test   [place] Charlott Rakes, MD   7 months ago Screening for colon cancer   [place] Charlott Rakes, MD   1 year ago Screening for colon cancer   [place] Charlott Rakes, MD   1 year ago Need for zoster vaccination   [place], Jarome Matin, RPH-CPP   1 year ago [place], Enobong, MD       Future Appointments             In 4 months Charlott Rakes, MD [place]             atorvastatin (LIPITOR) 20 MG tablet 90 tablet 1    Sig: TAKE 1 TABLET (20 MG TOTAL) BY MOUTH DAILY.     Cardiovascular:  Antilipid - Statins Passed - 02/26/2021  3:21 PM      Passed - Total Cholesterol in normal range and within 360 days    Cholesterol, Total  Date Value Ref Range Status  07/15/2020 153 100 - 199 mg/dL Final          Passed - LDL in normal range and within 360 days    LDL Chol Calc (NIH)  Date Value Ref Range Status  07/15/2020 70 0 - 99 mg/dL Final          Passed - HDL in normal range and within 360 days    HDL  Date Value Ref Range Status  07/15/2020 64 >39 mg/dL Final          Passed - Triglycerides in normal range and within 360 days    Triglycerides  Date Value Ref Range Status  07/15/2020 104 0 - 149 mg/dL Final          Passed - Patient is not pregnant      Passed - Valid encounter within last 12 months    Recent Outpatient Visits           1 month ago Need for hepatitis C screening test   [place], Enobong, MD   7 months ago Screening for colon cancer   [place], MD   1 year ago Screening for colon cancer   [facility] Charlott Rakes, MD   1 year ago Need for zoster vaccination   [place], RPH-CPP   1 year ago [place], Enobong, MD       Future Appointments             In 4 months Charlott Rakes, MD [place]             hydrOXYzine (ATARAX) 25 MG tablet 90 tablet 3     Sig: TAKE 1 TABLET (25 MG TOTAL) BY MOUTH 3 (THREE) TIMES DAILY AS NEEDED.     Ear, Nose, and Throat:  Antihistamines Passed - 02/26/2021  3:21 PM      Passed - Valid encounter within last 12 months    Recent Outpatient Visits           1 month ago Need for hepatitis C screening test   [place] Charlott Rakes, MD   7 months ago Screening for colon cancer   [place] Charlott Rakes, MD   1 year ago Screening for colon cancer   [place] Charlott Rakes, MD   1 year ago Need for zoster vaccination   [place], Jarome Matin, RPH-CPP   1 year ago [place], Charlane Ferretti, MD       Future Appointments             In 4 months Charlott Rakes, MD [place]             isosorbide mononitrate (IMDUR) 30 MG 24 hr tablet 90 tablet 1    Sig: Take 1 tablet (30 mg total) by mouth daily.     Cardiovascular:  Nitrates Failed - 02/26/2021  3:21 PM      Failed - Last BP in normal range    BP Readings from Last 1 Encounters:  01/07/21 (!) 146/75          Passed - Last Heart Rate in normal range    Pulse Readings from Last 1 Encounters:  01/07/21 60          Passed - Valid encounter within last 12 months    Recent Outpatient Visits           1 month ago Need for hepatitis C screening test   [place], Enobong, MD   7 months ago Screening for colon cancer   [place] Charlott Rakes, MD   1 year ago Screening for colon cancer   [place], Enobong, MD   1 year ago Need for zoster vaccination   [place], Jarome Matin, RPH-CPP   1 year ago [place],  Enobong, MD       Future Appointments             In 4 months Charlott Rakes, MD [place]

## 2021-02-26 NOTE — Telephone Encounter (Signed)
Medication Refill - Medication:  traZODone (DESYREL) lisinopril (ZESTRIL)  atorvastatin (LIPITOR)  amLODipine (NORVASC)  hydrOXYzine (ATARAX/VISTARIL) isosorbide mononitrate (IMDUR)   Has the patient contacted their pharmacy? No. (Agent: If no, request that the patient contact the pharmacy for the refill. If patient does not wish to contact the pharmacy document the reason why and proceed with request.) Pt said she calls [place] to refill Preferred Pharmacy (with phone number or street name): [facility] and [place]  Phone:  [phone] Fax:  [phone] Has the patient been seen for an appointment in the last year OR does the patient have an upcoming appointment? Yes.

## 2021-02-27 ENCOUNTER — Other Ambulatory Visit: Payer: Self-pay

## 2021-02-27 MED ORDER — ISOSORBIDE MONONITRATE ER 30 MG PO TB24
30.0000 mg | ORAL_TABLET | Freq: Every day | ORAL | 1 refills | Status: DC
  Filled 2021-02-27 – 2021-06-16 (×2): qty 90, 90d supply, fill #0

## 2021-02-27 MED ORDER — LISINOPRIL 40 MG PO TABS
ORAL_TABLET | Freq: Every day | ORAL | 1 refills | Status: DC
  Filled 2021-02-27 – 2021-06-16 (×3): qty 90, 90d supply, fill #0

## 2021-02-27 MED ORDER — TRAZODONE HCL 50 MG PO TABS
ORAL_TABLET | Freq: Every day | ORAL | 1 refills | Status: DC
  Filled 2021-02-27 – 2021-06-16 (×2): qty 90, 90d supply, fill #0

## 2021-02-27 MED ORDER — HYDROXYZINE HCL 25 MG PO TABS
ORAL_TABLET | Freq: Three times a day (TID) | ORAL | 3 refills | Status: DC | PRN
  Filled 2021-02-27 – 2021-06-16 (×2): qty 90, 30d supply, fill #0

## 2021-02-27 MED ORDER — AMLODIPINE BESYLATE 10 MG PO TABS
ORAL_TABLET | Freq: Every day | ORAL | 1 refills | Status: DC
  Filled 2021-02-27 – 2021-06-16 (×2): qty 90, 90d supply, fill #0

## 2021-02-27 MED ORDER — ATORVASTATIN CALCIUM 20 MG PO TABS
ORAL_TABLET | Freq: Every day | ORAL | 1 refills | Status: DC
  Filled 2021-02-27 – 2021-06-16 (×3): qty 90, 90d supply, fill #0

## 2021-03-03 ENCOUNTER — Other Ambulatory Visit: Payer: Self-pay

## 2021-03-04 ENCOUNTER — Other Ambulatory Visit: Payer: Self-pay

## 2021-06-02 ENCOUNTER — Other Ambulatory Visit: Payer: Self-pay

## 2021-06-09 ENCOUNTER — Other Ambulatory Visit: Payer: Self-pay

## 2021-06-16 ENCOUNTER — Other Ambulatory Visit: Payer: Self-pay

## 2021-06-17 ENCOUNTER — Other Ambulatory Visit: Payer: Self-pay

## 2021-06-18 ENCOUNTER — Other Ambulatory Visit: Payer: Self-pay

## 2021-06-19 ENCOUNTER — Other Ambulatory Visit: Payer: Self-pay

## 2021-07-01 ENCOUNTER — Other Ambulatory Visit: Payer: Self-pay

## 2021-07-07 ENCOUNTER — Other Ambulatory Visit: Payer: Self-pay

## 2021-07-07 ENCOUNTER — Ambulatory Visit: Payer: Medicare Other | Attending: Family Medicine | Admitting: Family Medicine

## 2021-07-07 ENCOUNTER — Encounter: Payer: Self-pay | Admitting: Family Medicine

## 2021-07-07 VITALS — BP 135/60 | HR 70 | Ht 63.0 in | Wt 157.0 lb

## 2021-07-07 DIAGNOSIS — M19041 Primary osteoarthritis, right hand: Secondary | ICD-10-CM | POA: Diagnosis not present

## 2021-07-07 DIAGNOSIS — F32A Depression, unspecified: Secondary | ICD-10-CM

## 2021-07-07 DIAGNOSIS — I10 Essential (primary) hypertension: Secondary | ICD-10-CM | POA: Diagnosis not present

## 2021-07-07 DIAGNOSIS — Z1211 Encounter for screening for malignant neoplasm of colon: Secondary | ICD-10-CM | POA: Diagnosis not present

## 2021-07-07 DIAGNOSIS — E7800 Pure hypercholesterolemia, unspecified: Secondary | ICD-10-CM

## 2021-07-07 DIAGNOSIS — F419 Anxiety disorder, unspecified: Secondary | ICD-10-CM

## 2021-07-07 DIAGNOSIS — M19042 Primary osteoarthritis, left hand: Secondary | ICD-10-CM

## 2021-07-07 MED ORDER — TRAZODONE HCL 50 MG PO TABS: ORAL_TABLET | Freq: Every day | ORAL | 1 refills | Status: DC

## 2021-07-07 MED ORDER — PREDNISONE 20 MG PO TABS
20.0000 mg | ORAL_TABLET | Freq: Every day | ORAL | 0 refills | Status: DC
  Filled 2021-07-07: qty 5, 5d supply, fill #0

## 2021-07-07 MED ORDER — ATORVASTATIN CALCIUM 20 MG PO TABS: ORAL_TABLET | Freq: Every day | ORAL | 1 refills | Status: DC

## 2021-07-07 MED ORDER — ESCITALOPRAM OXALATE 20 MG PO TABS: ORAL_TABLET | Freq: Every day | ORAL | 1 refills | Status: DC

## 2021-07-07 MED ORDER — ISOSORBIDE MONONITRATE ER 30 MG PO TB24: 30.0000 mg | ORAL_TABLET | Freq: Every day | ORAL | 1 refills | Status: DC

## 2021-07-07 MED ORDER — AMLODIPINE BESYLATE 10 MG PO TABS: ORAL_TABLET | Freq: Every day | ORAL | 1 refills | Status: DC

## 2021-07-07 MED ORDER — MELOXICAM 7.5 MG PO TABS: ORAL_TABLET | Freq: Every day | ORAL | 3 refills | Status: DC

## 2021-07-07 MED ORDER — PROPRANOLOL HCL ER 80 MG PO CP24: ORAL_CAPSULE | ORAL | 1 refills | Status: DC

## 2021-07-07 MED ORDER — LISINOPRIL 40 MG PO TABS: ORAL_TABLET | Freq: Every day | ORAL | 1 refills | Status: DC

## 2021-07-07 NOTE — Progress Notes (Signed)
? ?Subjective:  ?Patient ID: [name], female    DOB: 1950-10-28  Age: 71 y.o. MRN: [id number] ? ?CC: Hypertension and Arthritis ? ? ?HPI ?[name] is a 71 y.o. year old female with a history of hypertension, anxiety and depression, history of breast cancer (status post left mastectomy over 16 years ago) osteoarthritis of the hand who presents today for a follow-up visit. ? ?Interval History: ?I had referred her to infectious disease in 12/2020 due to positive hepatitis test but messages in her chart reveal infectious disease had called her to schedule and left messages for her but she is yet to schedule.  I have provided her with the number to infectious disease so she can call them to schedule an appointment. ? ?Her thumbs hurt on both hands at the base of her thumb and she sometimes has difficulty with range of motion.  Currently taking meloxicam chronically. ?For her hypertension she is doing well on her antihypertensives.  Doing well on her statin.  Anxiety and depression are controlled on her current regimen. ?Past Medical History:  ?Diagnosis Date  ? Breast cancer ([place]) 2004  ? left breast, Paget's disease, full involvement of nipple with invasive ductal carcinoma  ? Hepatitis C 07/17/2011  ? Positive Hepatitis C Antibody test and then confirmed with Hepatitis C Virus RNA by PCR.  ? Hypercholesteremia   ? dx 2010  ? Hypertension   ? dx 1996  ? Leukopenia 07/10/2011  ? Stable compared to 2012  ? ? ?Past Surgical History:  ?Procedure Laterality Date  ? ABDOMINAL HYSTERECTOMY    ? late 1990s.  ? BREAST SURGERY Left   ? 2004  ? MASTECTOMY  2005  ? left side  ? ? ?Family History  ?Problem Relation Age of Onset  ? Hypertension Mother   ? Arthritis Sister   ?     RA  ? ? ?Social History  ? ?Socioeconomic History  ? Marital status: Married  ?  Spouse name: Not on file  ? Number of children: Not on file  ? Years of education: Not on file  ? Highest education level: Not on file  ?Occupational History  ? Not on  file  ?Tobacco Use  ? Smoking status: Former  ?  Types: Cigarettes  ?  Quit date: 03/20/1994  ?  Years since quitting: 27.3  ? Smokeless tobacco: Never  ?Substance and Sexual Activity  ? Alcohol use: No  ? Drug use: No  ? Sexual activity: Yes  ?  Birth control/protection: Condom  ?Other Topics Concern  ? Not on file  ?Social History Narrative  ? Recently separated from husband of 37 years.   ? ?Social Determinants of Health  ? ?Financial Resource Strain: Not on file  ?Food Insecurity: Not on file  ?Transportation Needs: Not on file  ?Physical Activity: Not on file  ?Stress: Not on file  ?Social Connections: Not on file  ? ? ?Allergies  ?Allergen Reactions  ? Fish Allergy Shortness Of Breath  ? Strawberry Extract Shortness Of Breath  ? Aspirin Nausea Only  ? ? ?Outpatient Medications Prior to Visit  ?Medication Sig Dispense Refill  ? acetaminophen (TYLENOL) 325 MG tablet Take 2 tablets (650 mg total) by mouth every 6 (six) hours as needed. 30 tablet 0  ? cetirizine (ZYRTEC) 10 MG tablet Take 1 tablet (10 mg total) by mouth daily. 30 tablet 6  ? Elastic Bandages & Supports (WRIST SPLINT/ELASTIC LEFT MED) MISC 1 each by Does not apply  route daily. 1 each 0  ? Elastic Bandages & Supports (WRIST SPLINT/ELASTIC RIGHT MED) MISC 1 each by Does not apply route daily. 1 each 0  ? hydrOXYzine (ATARAX) 25 MG tablet TAKE 1 TABLET (25 MG TOTAL) BY MOUTH 3 (THREE) TIMES DAILY AS NEEDED. 90 tablet 3  ? amLODipine (NORVASC) 10 MG tablet TAKE 1 TABLET (10 MG TOTAL) BY MOUTH DAILY. 90 tablet 1  ? atorvastatin (LIPITOR) 20 MG tablet TAKE 1 TABLET (20 MG TOTAL) BY MOUTH DAILY. 90 tablet 1  ? escitalopram (LEXAPRO) 20 MG tablet TAKE 1 TABLET (20 MG TOTAL) BY MOUTH DAILY. 90 tablet 1  ? isosorbide mononitrate (IMDUR) 30 MG 24 hr tablet Take 1 tablet (30 mg total) by mouth daily. 90 tablet 1  ? lisinopril (ZESTRIL) 40 MG tablet TAKE 1 TABLET (40 MG TOTAL) BY MOUTH DAILY. 90 tablet 1  ? meloxicam (MOBIC) 7.5 MG tablet TAKE 1 TABLET (7.5 MG  TOTAL) BY MOUTH DAILY. 30 tablet 3  ? predniSONE (DELTASONE) 20 MG tablet Take 1 tablet (20 mg total) by mouth daily with breakfast. 5 tablet 0  ? propranolol ER (INDERAL LA) 80 MG 24 hr capsule TAKE 1 CAPSULE (80 MG TOTAL) BY MOUTH AT BEDTIME. 90 capsule 1  ? traZODone (DESYREL) 50 MG tablet TAKE 1 TABLET (50 MG TOTAL) BY MOUTH AT BEDTIME. 90 tablet 1  ? ?No facility-administered medications prior to visit.  ? ? ? ?ROS ?Review of Systems  ?Constitutional:  Negative for activity change, appetite change and fatigue.  ?HENT:  Negative for congestion, sinus pressure and sore throat.   ?Eyes:  Negative for visual disturbance.  ?Respiratory:  Negative for cough, chest tightness, shortness of breath and wheezing.   ?Cardiovascular:  Negative for chest pain and palpitations.  ?Gastrointestinal:  Negative for abdominal distention, abdominal pain and constipation.  ?Endocrine: Negative for polydipsia.  ?Genitourinary:  Negative for dysuria and frequency.  ?Musculoskeletal:   ?     See HPI  ?Skin:  Negative for rash.  ?Neurological:  Negative for tremors, light-headedness and numbness.  ?Hematological:  Does not bruise/bleed easily.  ?Psychiatric/Behavioral:  Negative for agitation and behavioral problems.   ? ?Objective:  ?BP 135/60   Pulse 70   Ht '5\' 3"'$  (1.6 m)   Wt 157 lb (71.2 kg)   SpO2 100%   BMI 27.81 kg/m?  ? ? ?  07/07/2021  ?  4:02 PM 07/07/2021  ?  3:36 PM 01/07/2021  ?  8:33 AM  ?BP/Weight  ?Systolic BP 326 712 458  ?Diastolic BP 60 80 75  ?Wt. (Lbs)  157 165  ?BMI  27.81 kg/m2 29.23 kg/m2  ? ? ? ? ?Physical Exam ?Constitutional:   ?   Appearance: She is well-developed.  ?Cardiovascular:  ?   Rate and Rhythm: Normal rate.  ?   Heart sounds: Normal heart sounds. No murmur heard. ?Pulmonary:  ?   Effort: Pulmonary effort is normal.  ?   Breath sounds: Normal breath sounds. No wheezing or rales.  ?Chest:  ?   Chest wall: No tenderness.  ?Abdominal:  ?   General: Bowel sounds are normal. There is no distension.  ?    Palpations: Abdomen is soft. There is no mass.  ?   Tenderness: There is no abdominal tenderness.  ?Musculoskeletal:  ?   Right lower leg: No edema.  ?   Left lower leg: No edema.  ?   Comments: Normal appearance of both hands.  Able to make a fist  bilaterally ?Positive Finkelstein's test on the left  ?Neurological:  ?   Mental Status: She is alert and oriented to person, place, and time.  ?Psychiatric:     ?   Mood and Affect: Mood normal.  ? ? ? ?  Latest Ref Rng & Units 01/07/2021  ?  9:15 AM 07/15/2020  ?  8:54 AM 01/09/2020  ?  4:55 PM  ?CMP  ?Glucose 70 - 99 mg/dL 115   117   83    ?BUN 8 - 27 mg/dL '13   13   15    '$ ?Creatinine 0.57 - 1.00 mg/dL 0.87   0.87   0.97    ?Sodium 134 - 144 mmol/L 139   138   137    ?Potassium 3.5 - 5.2 mmol/L 4.5   4.5   3.5    ?Chloride 96 - 106 mmol/L 102   101   101    ?CO2 20 - 29 mmol/L '25   22   23    '$ ?Calcium 8.7 - 10.3 mg/dL 10.0   9.5   9.3    ?Total Protein 6.0 - 8.5 g/dL 7.0   7.2   7.1    ?Total Bilirubin 0.0 - 1.2 mg/dL 0.4   0.4   0.8    ?Alkaline Phos 44 - 121 IU/L 78   70   82    ?AST 0 - 40 IU/L 43   47   75    ?ALT 0 - 32 IU/L 43   44   64    ? ? ?Lipid Panel  ?   ?Component Value Date/Time  ? CHOL 153 07/15/2020 0854  ? TRIG 104 07/15/2020 0854  ? HDL 64 07/15/2020 0854  ? CHOLHDL 2.4 07/15/2020 0854  ? CHOLHDL 3.5 03/20/2014 1118  ? VLDL 20 03/20/2014 1118  ? [place] 70 07/15/2020 0854  ? ? ?CBC ?   ?Component Value Date/Time  ? WBC 4.3 12/17/2016 1539  ? WBC 4.5 03/20/2014 1118  ? RBC 4.74 12/17/2016 1539  ? RBC 4.52 03/20/2014 1118  ? HGB 13.7 12/17/2016 1539  ? HCT 41.7 12/17/2016 1539  ? HCT 38 08/12/2011 1445  ? PLT 235 12/17/2016 1539  ? MCV 88 12/17/2016 1539  ? MCV 82.2 08/12/2011 1445  ? MCH 28.9 12/17/2016 1539  ? MCH 28.8 03/20/2014 1118  ? MCHC 32.9 12/17/2016 1539  ? MCHC 34.3 03/20/2014 1118  ? RDW 14.3 12/17/2016 1539  ? LYMPHSABS 2.1 12/17/2016 1539  ? MONOABS 0.3 07/10/2011 1327  ? EOSABS 0.1 12/17/2016 1539  ? BASOSABS 0.0 12/17/2016 1539  ? ? ?Lab  Results  ?Component Value Date  ? HGBA1C 5.40 04/19/2015  ? ? ?Assessment & Plan:  ?1. Primary osteoarthritis of both hands ?Uncontrolled ?Possibly superimposed de Quervain's tenosynovitis ?She may benefit f

## 2021-07-07 NOTE — Patient Instructions (Signed)
Please call to schedule an appointment for treatment of the hepatitis C: ?Placed in [facility] ?[place], [place] [address] ?Phone # [phone] ? ? ?

## 2021-07-08 LAB — BASIC METABOLIC PANEL
BUN/Creatinine Ratio: 17 (ref 12–28)
BUN: 22 mg/dL (ref 8–27)
CO2: 21 mmol/L (ref 20–29)
Calcium: 10.7 mg/dL — ABNORMAL HIGH (ref 8.7–10.3)
Chloride: 97 mmol/L (ref 96–106)
Creatinine, Ser: 1.26 mg/dL — ABNORMAL HIGH (ref 0.57–1.00)
Glucose: 70 mg/dL (ref 70–99)
Potassium: 4.2 mmol/L (ref 3.5–5.2)
Sodium: 135 mmol/L (ref 134–144)
eGFR: 46 mL/min/{1.73_m2} — ABNORMAL LOW (ref >59)

## 2021-10-10 ENCOUNTER — Other Ambulatory Visit: Payer: Self-pay | Admitting: Family Medicine

## 2021-10-10 DIAGNOSIS — M19041 Primary osteoarthritis, right hand: Secondary | ICD-10-CM

## 2021-10-13 NOTE — Telephone Encounter (Signed)
Requested medication (s) are due for refill today: yes  Requested medication (s) are on the active medication list: yes  Last refill:  07/07/21 #30 with 3 RF (mail order)  Future visit scheduled: 01/07/22, seen 07/07/21  Notes to clinic:  Hgb and Hct from 2018, please assess.      Requested Prescriptions  Pending Prescriptions Disp Refills   meloxicam (MOBIC) 7.5 MG tablet [Pharmacy Med Name: Meloxicam 7.5 MG Oral Tablet] 100 tablet 2    Sig: TAKE 1 TABLET BY MOUTH DAILY     Analgesics:  COX2 Inhibitors Failed - 10/10/2021 10:23 PM      Failed - Manual Review: Labs are only required if the patient has taken medication for more than 8 weeks.      Failed - HGB in normal range and within 360 days    Hemoglobin  Date Value Ref Range Status  12/17/2016 13.7 11.1 - 15.9 g/dL Final         Failed - Cr in normal range and within 360 days    Creat  Date Value Ref Range Status  10/21/2015 0.90 0.50 - 0.99 mg/dL Final    Comment:      For patients > or = 70 years of age: The upper reference limit for Creatinine is approximately 13% higher for people identified as African-American.      Creatinine, Ser  Date Value Ref Range Status  07/07/2021 1.26 (H) 0.57 - 1.00 mg/dL Final         Failed - HCT in normal range and within 360 days    HCT  Date Value Ref Range Status  08/12/2011 38 % Final   Hematocrit  Date Value Ref Range Status  12/17/2016 41.7 34.0 - 46.6 % Final         Failed - AST in normal range and within 360 days    AST  Date Value Ref Range Status  01/07/2021 43 (H) 0 - 40 IU/L Final  08/12/2011 33 U/L Final         Failed - ALT in normal range and within 360 days    ALT  Date Value Ref Range Status  01/07/2021 43 (H) 0 - 32 IU/L Final         Passed - eGFR is 30 or above and within 360 days    GFR, Est African American  Date Value Ref Range Status  10/21/2015 78 >=60 mL/min Final   GFR calc Af Amer  Date Value Ref Range Status  01/09/2020 69 >59  mL/min/1.73 Final    Comment:    **In accordance with recommendations from the NKF-ASN Task force,**   [facility] is in the process of updating its eGFR calculation to the   2021 CKD-EPI creatinine equation that estimates kidney function   without a race variable.    GFR, Est Non African American  Date Value Ref Range Status  10/21/2015 68 >=60 mL/min Final   GFR calc non Af Amer  Date Value Ref Range Status  01/09/2020 60 >59 mL/min/1.73 Final   eGFR  Date Value Ref Range Status  07/07/2021 46 (L) >59 mL/min/1.73 Final         Passed - Patient is not pregnant      Passed - Valid encounter within last 12 months    Recent Outpatient Visits           3 months ago Screening for colon cancer   [place], Charlane Ferretti, MD  9 months ago Need for hepatitis C screening test   [place], MD   1 year ago Screening for colon cancer   [place] Charlott Rakes, MD   1 year ago Screening for colon cancer   [place], MD   2 years ago Need for zoster vaccination   [place], RPH-CPP       Future Appointments             In 2 months Charlott Rakes, MD [place]

## 2021-10-28 ENCOUNTER — Other Ambulatory Visit: Payer: Self-pay | Admitting: Family Medicine

## 2021-10-28 DIAGNOSIS — I10 Essential (primary) hypertension: Secondary | ICD-10-CM

## 2021-10-28 DIAGNOSIS — E7800 Pure hypercholesterolemia, unspecified: Secondary | ICD-10-CM

## 2021-10-28 DIAGNOSIS — F419 Anxiety disorder, unspecified: Secondary | ICD-10-CM

## 2021-10-28 NOTE — Telephone Encounter (Signed)
Requested medication (s) are due for refill today: yes  Requested medication (s) are on the active medication list: yes  Last refill:  07/07/21 #90 with 1 RF  Future visit scheduled: yes, 01/07/22  Notes to clinic:  Failed protocol of labs within 12 months, (cholesterol lab) has upcoming appt, please assess.       Requested Prescriptions  Pending Prescriptions Disp Refills   atorvastatin (LIPITOR) 20 MG tablet [Pharmacy Med Name: Atorvastatin Calcium 20 MG Oral Tablet] 100 tablet 2    Sig: TAKE 1 TABLET BY MOUTH DAILY     Cardiovascular:  Antilipid - Statins Failed - 10/28/2021  1:18 PM      Failed - Lipid Panel in normal range within the last 12 months    Cholesterol, Total  Date Value Ref Range Status  07/15/2020 153 100 - 199 mg/dL Final   LDL Chol Calc (NIH)  Date Value Ref Range Status  07/15/2020 70 0 - 99 mg/dL Final   HDL  Date Value Ref Range Status  07/15/2020 64 >39 mg/dL Final   Triglycerides  Date Value Ref Range Status  07/15/2020 104 0 - 149 mg/dL Final         Passed - Patient is not pregnant      Passed - Valid encounter within last 12 months    Recent Outpatient Visits           3 months ago Screening for colon cancer   [place], Charlane Ferretti, MD   9 months ago Need for hepatitis C screening test   [place], [place]obong, MD   1 year ago Screening for colon cancer   [place], Enobong, MD   1 year ago Screening for colon cancer   [place], Enobong, MD   2 years ago Need for zoster vaccination   [place], RPH-CPP       Future Appointments             In 2 months Charlott Rakes, MD [place]            Signed Prescriptions Disp Refills   amLODipine (NORVASC) 10 MG tablet 100 tablet 0    Sig: TAKE 1  TABLET BY MOUTH DAILY     Cardiovascular: Calcium Channel Blockers 2 Passed - 10/28/2021  1:18 PM      Passed - Last BP in normal range    BP Readings from Last 1 Encounters:  07/07/21 135/60         Passed - Last Heart Rate in normal range    Pulse Readings from Last 1 Encounters:  07/07/21 70         Passed - Valid encounter within last 6 months    Recent Outpatient Visits           3 months ago Screening for colon cancer   [place], Charlane Ferretti, MD   9 months ago Need for hepatitis C screening test   [place], Enobong, MD   1 year ago Screening for colon cancer   [place], Enobong, MD   1 year ago Screening for colon cancer   [place], MD   2 years ago Need for zoster vaccination  [place], RPH-CPP       Future Appointments             In 2 months Charlott Rakes, MD [place]             lisinopril (ZESTRIL) 40 MG tablet 100 tablet 0    Sig: TAKE 1 TABLET BY MOUTH DAILY     Cardiovascular:  ACE Inhibitors Failed - 10/28/2021  1:18 PM      Failed - Cr in normal range and within 180 days    Creat  Date Value Ref Range Status  10/21/2015 0.90 0.50 - 0.99 mg/dL Final    Comment:      For patients > or = 70 years of age: The upper reference limit for Creatinine is approximately 13% higher for people identified as African-American.      Creatinine, Ser  Date Value Ref Range Status  07/07/2021 1.26 (H) 0.57 - 1.00 mg/dL Final         Passed - K in normal range and within 180 days    Potassium  Date Value Ref Range Status  07/07/2021 4.2 3.5 - 5.2 mmol/L Final         Passed - Patient is not pregnant      Passed - Last BP in normal range    BP Readings from Last 1 Encounters:  07/07/21 135/60          Passed - Valid encounter within last 6 months    Recent Outpatient Visits           3 months ago Screening for colon cancer   [place], Charlane Ferretti, MD   9 months ago Need for hepatitis C screening test   [place] Charlott Rakes, MD   1 year ago Screening for colon cancer   [place] Charlott Rakes, MD   1 year ago Screening for colon cancer   [place], Charlane Ferretti, MD   2 years ago Need for zoster vaccination   [place], Stephen L, RPH-CPP       Future Appointments             In 2 months Charlott Rakes, MD [place]             propranolol ER (INDERAL LA) 80 MG 24 hr capsule 100 capsule 0    Sig: TAKE 1 CAPSULE BY MOUTH AT  BEDTIME     Cardiovascular:  Beta Blockers Passed - 10/28/2021  1:18 PM      Passed - Last BP in normal range    BP Readings from Last 1 Encounters:  07/07/21 135/60         Passed - Last Heart Rate in normal range    Pulse Readings from Last 1 Encounters:  07/07/21 70         Passed - Valid encounter within last 6 months    Recent Outpatient Visits           3 months ago Screening for colon cancer   [place], Enobong, MD   9 months ago Need for hepatitis C screening test   [place], MD   1 year ago Screening for colon cancer  [place] Charlott Rakes, MD   1 year ago Screening for colon cancer   [place], MD   2 years ago Need for zoster vaccination   [place], RPH-CPP       Future Appointments             In 2 months Charlott Rakes, MD [place]

## 2021-10-28 NOTE — Telephone Encounter (Signed)
Requested Prescriptions  Pending Prescriptions Disp Refills  . amLODipine (NORVASC) 10 MG tablet [Pharmacy Med Name: amLODIPine Besylate 10 MG Oral Tablet] 100 tablet 0    Sig: TAKE 1 TABLET BY MOUTH DAILY     Cardiovascular: Calcium Channel Blockers 2 Passed - 10/28/2021  1:18 PM      Passed - Last BP in normal range    BP Readings from Last 1 Encounters:  07/07/21 135/60         Passed - Last Heart Rate in normal range    Pulse Readings from Last 1 Encounters:  07/07/21 70         Passed - Valid encounter within last 6 months    Recent Outpatient Visits          3 months ago Screening for colon cancer   [place], Charlane Ferretti, MD   9 months ago Need for hepatitis C screening test   [place], Enobong, MD   1 year ago Screening for colon cancer   [place], Enobong, MD   1 year ago Screening for colon cancer   [place], MD   2 years ago Need for zoster vaccination   [place], RPH-CPP      Future Appointments            In 2 months Charlott Rakes, MD [place]           . lisinopril (ZESTRIL) 40 MG tablet [Pharmacy Med Name: Lisinopril 40 MG Oral Tablet] 100 tablet 0    Sig: TAKE 1 TABLET BY MOUTH DAILY     Cardiovascular:  ACE Inhibitors Failed - 10/28/2021  1:18 PM      Failed - Cr in normal range and within 180 days    Creat  Date Value Ref Range Status  10/21/2015 0.90 0.50 - 0.99 mg/dL Final    Comment:      For patients > or = 70 years of age: The upper reference limit for Creatinine is approximately 13% higher for people identified as African-American.      Creatinine, Ser  Date Value Ref Range Status  07/07/2021 1.26 (H) 0.57 - 1.00 mg/dL Final         Passed - K in normal range and within 180 days     Potassium  Date Value Ref Range Status  07/07/2021 4.2 3.5 - 5.2 mmol/L Final         Passed - Patient is not pregnant      Passed - Last BP in normal range    BP Readings from Last 1 Encounters:  07/07/21 135/60         Passed - Valid encounter within last 6 months    Recent Outpatient Visits          3 months ago Screening for colon cancer   [place], Charlane Ferretti, MD   9 months ago Need for hepatitis C screening test   [place], Enobong, MD   1 year ago Screening for colon cancer   [place], Enobong, MD   1 year ago Screening for colon cancer   [place], MD   2 years ago Need for zoster vaccination  [place], RPH-CPP      Future Appointments            In 2 months Charlott Rakes, MD [place]           . atorvastatin (LIPITOR) 20 MG tablet [Pharmacy Med Name: Atorvastatin Calcium 20 MG Oral Tablet] 100 tablet 2    Sig: TAKE 1 TABLET BY MOUTH DAILY     Cardiovascular:  Antilipid - Statins Failed - 10/28/2021  1:18 PM      Failed - Lipid Panel in normal range within the last 12 months    Cholesterol, Total  Date Value Ref Range Status  07/15/2020 153 100 - 199 mg/dL Final   LDL Chol Calc (NIH)  Date Value Ref Range Status  07/15/2020 70 0 - 99 mg/dL Final   HDL  Date Value Ref Range Status  07/15/2020 64 >39 mg/dL Final   Triglycerides  Date Value Ref Range Status  07/15/2020 104 0 - 149 mg/dL Final         Passed - Patient is not pregnant      Passed - Valid encounter within last 12 months    Recent Outpatient Visits          3 months ago Screening for colon cancer   [place], Charlane Ferretti, MD   9 months ago Need for hepatitis C screening test   [name], Enobong, MD   1 year ago Screening for colon cancer   [place], Enobong, MD   1 year ago Screening for colon cancer   [place], Enobong, MD   2 years ago Need for zoster vaccination   [place], RPH-CPP      Future Appointments            In 2 months Charlott Rakes, MD [place]           . propranolol ER (INDERAL LA) 80 MG 24 hr capsule [Pharmacy Med Name: PROPRANOLOL  '80MG'$   CAP  EXTENDED RELEASE] 100 capsule 0    Sig: TAKE 1 CAPSULE BY MOUTH AT  BEDTIME     Cardiovascular:  Beta Blockers Passed - 10/28/2021  1:18 PM      Passed - Last BP in normal range    BP Readings from Last 1 Encounters:  07/07/21 135/60         Passed - Last Heart Rate in normal range    Pulse Readings from Last 1 Encounters:  07/07/21 70         Passed - Valid encounter within last 6 months    Recent Outpatient Visits          3 months ago Screening for colon cancer   [place], Charlane Ferretti, MD   9 months ago Need for hepatitis C screening test   [place], Enobong, MD   1 year ago Screening for colon cancer   [place], MD   1 year ago Screening for colon cancer   [place], MD   2 years ago Need for zoster vaccination   [place], Annie Main  L, RPH-CPP      Future Appointments            In 2 months Charlott Rakes, MD [place]

## 2021-10-29 NOTE — Telephone Encounter (Signed)
Refilled 07/07/2021 #90 1 refill. Requested Prescriptions  Pending Prescriptions Disp Refills  . isosorbide mononitrate (IMDUR) 30 MG 24 hr tablet [Pharmacy Med Name: Isosorbide Mononitrate ER 30 MG Oral Tablet Extended Release 24 Hour] 100 tablet 2    Sig: TAKE 1 TABLET BY MOUTH DAILY     Cardiovascular:  Nitrates Passed - 10/28/2021 10:38 PM      Passed - Last BP in normal range    BP Readings from Last 1 Encounters:  07/07/21 135/60         Passed - Last Heart Rate in normal range    Pulse Readings from Last 1 Encounters:  07/07/21 70         Passed - Valid encounter within last 12 months    Recent Outpatient Visits          3 months ago Screening for colon cancer   [place], Charlane Ferretti, MD   9 months ago Need for hepatitis C screening test   [place], Enobong, MD   1 year ago Screening for colon cancer   [place] Charlott Rakes, MD   1 year ago Screening for colon cancer   [place], MD   2 years ago Need for zoster vaccination   [place], RPH-CPP      Future Appointments            In 2 months Charlott Rakes, MD [place]

## 2021-11-05 ENCOUNTER — Ambulatory Visit: Payer: Self-pay

## 2021-11-05 NOTE — Telephone Encounter (Signed)
No. She needs to be on a Provider schedule 'for in-office ABI'. Ok to place on walk in schedule for next week.

## 2021-11-05 NOTE — Telephone Encounter (Signed)
Pt has been placed on [facility] schedule.

## 2021-11-05 NOTE — Telephone Encounter (Signed)
Can patient be placed on nurse schedule.

## 2021-11-05 NOTE — Telephone Encounter (Signed)
Patient states that a Medicare Nurse did a home visit yesterday and told her that the blood was not flowing correctly in her left leg. Nurse advised patient to make an appointment with pcp to have it checked., no appointments available. Patient states she does not have any pain in her leg.Please advise.     Chief Complaint: Decreased blood flow to left leg per Medicare nurse, per pt. Asking to worked in. No symptoms. Symptoms: None Frequency: Yesterday Pertinent Negatives: Patient denies  Disposition: '[]'$ ED /'[]'$ Urgent Care (no appt availability in office) / '[]'$ Appointment(In office/virtual)/ '[]'$  [facility]/ '[]'$ Home Care/ '[]'$ Refused Recommended Disposition /'[]'$ [facility]/ '[x]'$  Follow-up with PCP Additional Notes: Please advise pt.  Answer Assessment - Initial Assessment Questions 1. REASON FOR CALL: "What is your main concern right now?"     Medicare nurse did a flow study on me and said I have decreased circulation to my left leg. 2. ONSET: "When did the  start?"     Yesterday 3. SEVERITY: "How bad is the ?"     N/a 4. FEVER: "Do you have a fever?"     No 5. OTHER SYMPTOMS: "Do you have any other new symptoms?"     No symptoms 6. TREATMENTS AND RESPONSE: "What have you done so far to try to make this better? What medicines have you used?"     N/a 7. PREGNANCY: "Is there any chance you are pregnant?" "When was your last menstrual period?"     No  Protocols used: No Guideline Available-[facility]

## 2021-11-10 ENCOUNTER — Encounter: Payer: Self-pay | Admitting: Family Medicine

## 2021-11-10 ENCOUNTER — Ambulatory Visit
Admission: RE | Admit: 2021-11-10 | Discharge: 2021-11-10 | Disposition: A | Discharge status: Home / Self Care | Payer: Medicare Other | Source: Ambulatory Visit | Attending: Family Medicine | Admitting: Family Medicine

## 2021-11-10 ENCOUNTER — Ambulatory Visit: Payer: Medicare Other | Attending: Family Medicine | Admitting: Family Medicine

## 2021-11-10 VITALS — BP 146/67 | HR 56 | Ht 64.0 in | Wt 151.8 lb

## 2021-11-10 DIAGNOSIS — Z1211 Encounter for screening for malignant neoplasm of colon: Secondary | ICD-10-CM | POA: Diagnosis not present

## 2021-11-10 DIAGNOSIS — M25561 Pain in right knee: Secondary | ICD-10-CM | POA: Diagnosis not present

## 2021-11-10 DIAGNOSIS — Z23 Encounter for immunization: Secondary | ICD-10-CM | POA: Diagnosis not present

## 2021-11-10 DIAGNOSIS — Z1231 Encounter for screening mammogram for malignant neoplasm of breast: Secondary | ICD-10-CM | POA: Diagnosis not present

## 2021-11-10 DIAGNOSIS — G8929 Other chronic pain: Secondary | ICD-10-CM

## 2021-11-10 DIAGNOSIS — I10 Essential (primary) hypertension: Secondary | ICD-10-CM

## 2021-11-10 DIAGNOSIS — E2839 Other primary ovarian failure: Secondary | ICD-10-CM

## 2021-11-10 LAB — POCT ABI - SCREENING FOR PILOT NO CHARGE
Left ABI: 1.25
Right ABI: 1.25

## 2021-11-10 NOTE — Progress Notes (Signed)
Right ABI 1.25 Left ABI 1.25

## 2021-11-10 NOTE — Progress Notes (Addendum)
Subjective:  Patient ID: [name], female    DOB: 1950/06/02  Age: 71 y.o. MRN: [id number]  CC: ankle-brachial index    HPI [name] is a 71 y.o. year old female with a history of hypertension, anxiety and depression, history of breast cancer (status post left mastectomy over 16 years ago) osteoarthritis of the hand.  Interval History: She presents today because a nurse from her insurance company has that she did have an evidence of peripheral vascular disease on her home vascular test performed by [facility]: right -1.14, left 0.87.  She denies presence of claudication pain. Visit for chronic disease management was performed 3 months ago and she was scheduled to return in 6 months.  Right knee hurts and swells and she takes Meloxicam as needed with some relief.  She has not had any difficulty with her gait neither has she had recent falls. 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   Hypercholesteremia    dx 2010   Hypertension    dx 1996   Leukopenia 07/10/2011   Stable compared to 2012    Past Surgical History:  Procedure Laterality Date   ABDOMINAL HYSTERECTOMY     late 1990s.   BREAST SURGERY Left    2004   MASTECTOMY  2005   left side    Family History  Problem Relation Age of Onset   Hypertension Mother    Arthritis Sister        RA    Social History   Socioeconomic History   Marital status: Divorced    Spouse name: Not on file   Number of children: Not on file   Years of education: Not on file   Highest education level: Not on file  Occupational History   Not on file  Tobacco Use   Smoking status: Former    Types: Cigarettes    Quit date: 03/20/1994    Years since quitting: 27.6   Smokeless tobacco: Never  Substance  and Sexual Activity   Alcohol use: No   Drug use: No   Sexual activity: Yes    Birth control/protection: Condom  Other Topics Concern   Not on file  Social History Narrative   Recently separated from husband of 2 years.    Social Determinants of Health   Financial Resource Strain: Not on file  Food Insecurity: Not on file  Transportation Needs: Not on file  Physical Activity: Not on file  Stress: Not on file  Social Connections: Not on file    Allergies  Allergen Reactions   Fish Allergy Shortness Of Breath   Strawberry Extract Shortness Of Breath   Aspirin Nausea Only    Outpatient Medications Prior to Visit  Medication Sig Dispense Refill   acetaminophen (TYLENOL) 325 MG tablet Take 2 tablets (650 mg total) by mouth every 6 (six) hours as needed. 30 tablet 0   amLODipine (NORVASC) 10 MG tablet TAKE 1 TABLET BY MOUTH DAILY 100 tablet 0   atorvastatin (LIPITOR) 20 MG tablet TAKE 1 TABLET BY MOUTH DAILY 100 tablet 1   cetirizine (ZYRTEC) 10 MG tablet Take 1 tablet (10 mg total) by mouth daily. 30 tablet 6   Elastic Bandages & Supports (WRIST SPLINT/ELASTIC LEFT MED) MISC 1 each  by Does not apply route daily. 1 each 0   Elastic Bandages & Supports (WRIST SPLINT/ELASTIC RIGHT MED) MISC 1 each by Does not apply route daily. 1 each 0   escitalopram (LEXAPRO) 20 MG tablet TAKE 1 TABLET (20 MG TOTAL) BY MOUTH DAILY. 90 tablet 1   hydrOXYzine (ATARAX) 25 MG tablet TAKE 1 TABLET (25 MG TOTAL) BY MOUTH 3 (THREE) TIMES DAILY AS NEEDED. 90 tablet 3   isosorbide mononitrate (IMDUR) 30 MG 24 hr tablet Take 1 tablet (30 mg total) by mouth daily. 90 tablet 1   lisinopril (ZESTRIL) 40 MG tablet TAKE 1 TABLET BY MOUTH DAILY 100 tablet 0   meloxicam (MOBIC) 7.5 MG tablet TAKE 1 TABLET BY MOUTH DAILY 30 tablet 0   propranolol ER (INDERAL LA) 80 MG 24 hr capsule TAKE 1 CAPSULE BY MOUTH AT  BEDTIME 100 capsule 0   traZODone (DESYREL) 50 MG tablet TAKE 1 TABLET (50 MG TOTAL) BY MOUTH AT BEDTIME.  90 tablet 1   predniSONE (DELTASONE) 20 MG tablet Take 1 tablet (20 mg total) by mouth daily with breakfast. (Patient not taking: Reported on 11/10/2021) 5 tablet 0   No facility-administered medications prior to visit.     ROS Review of Systems  Constitutional:  Negative for activity change, appetite change and fatigue.  HENT:  Negative for congestion, sinus pressure and sore throat.   Eyes:  Negative for visual disturbance.  Respiratory:  Negative for cough, chest tightness, shortness of breath and wheezing.   Cardiovascular:  Negative for chest pain and palpitations.  Gastrointestinal:  Negative for abdominal distention, abdominal pain and constipation.  Endocrine: Negative for polydipsia.  Genitourinary:  Negative for dysuria and frequency.  Musculoskeletal: See HPI Skin:  Negative for rash.  Neurological:  Negative for tremors, light-headedness and numbness.  Hematological:  Does not bruise/bleed easily.  Psychiatric/Behavioral:  Negative for agitation and behavioral problems.     Objective:  BP (!) 146/67   Pulse (!) 56   Ht '5\' 4"'$  (1.626 m)   Wt 151 lb 12.8 oz (68.9 kg)   SpO2 100%   BMI 26.06 kg/m      11/10/2021    8:50 AM 07/07/2021    4:02 PM 07/07/2021    3:36 PM  BP/Weight  Systolic BP 778 242 353  Diastolic BP 67 60 80  Wt. (Lbs) 151.8  157  BMI 26.06 kg/m2  27.81 kg/m2      Physical Exam Constitutional:      Appearance: She is well-developed.  Cardiovascular:     Rate and Rhythm: Bradycardia present.     Heart sounds: Normal heart sounds. No murmur heard. Pulmonary:     Effort: Pulmonary effort is normal.     Breath sounds: Normal breath sounds. No wheezing or rales.  Chest:     Chest wall: No tenderness.  Abdominal:     General: Bowel sounds are normal. There is no distension.     Palpations: Abdomen is soft. There is no mass.     Tenderness: There is no abdominal tenderness.  Musculoskeletal:     Right lower leg: No edema.     Left lower leg:  No edema.     Comments: Slight edema of anterior right knee with associated tenderness on flexion and extension and associated crepitus  Neurological:     Mental Status: She is alert and oriented to person, place, and time.  Psychiatric:        Mood and Affect: Mood normal.  Latest Ref Rng & Units 07/07/2021    4:33 PM 01/07/2021    9:15 AM 07/15/2020    8:54 AM  CMP  Glucose 70 - 99 mg/dL 70  115  117   BUN 8 - 27 mg/dL '22  13  13   '$ Creatinine 0.57 - 1.00 mg/dL 1.26  0.87  0.87   Sodium 134 - 144 mmol/L 135  139  138   Potassium 3.5 - 5.2 mmol/L 4.2  4.5  4.5   Chloride 96 - 106 mmol/L 97  102  101   CO2 20 - 29 mmol/L '21  25  22   '$ Calcium 8.7 - 10.3 mg/dL 10.7  10.0  9.5   Total Protein 6.0 - 8.5 g/dL  7.0  7.2   Total Bilirubin 0.0 - 1.2 mg/dL  0.4  0.4   Alkaline Phos 44 - 121 IU/L  78  70   AST 0 - 40 IU/L  43  47   ALT 0 - 32 IU/L  43  44     Lipid Panel     Component Value Date/Time   CHOL 153 07/15/2020 0854   TRIG 104 07/15/2020 0854   HDL 64 07/15/2020 0854   CHOLHDL 2.4 07/15/2020 0854   CHOLHDL 3.5 03/20/2014 1118   VLDL 20 03/20/2014 1118   LDLCALC 70 07/15/2020 0854    CBC    Component Value Date/Time   WBC 4.3 12/17/2016 1539   WBC 4.5 03/20/2014 1118   RBC 4.74 12/17/2016 1539   RBC 4.52 03/20/2014 1118   HGB 13.7 12/17/2016 1539   HCT 41.7 12/17/2016 1539   HCT 38 08/12/2011 1445   PLT 235 12/17/2016 1539   MCV 88 12/17/2016 1539   MCV 82.2 08/12/2011 1445   MCH 28.9 12/17/2016 1539   MCH 28.8 03/20/2014 1118   MCHC 32.9 12/17/2016 1539   MCHC 34.3 03/20/2014 1118   RDW 14.3 12/17/2016 1539   LYMPHSABS 2.1 12/17/2016 1539   MONOABS 0.3 07/10/2011 1327   EOSABS 0.1 12/17/2016 1539   BASOSABS 0.0 12/17/2016 1539    Lab Results  Component Value Date   HGBA1C 5.40 04/19/2015    ABI performed in the clinic bilaterally-1.25 (no evidence of PAD) Assessment & Plan:  1. Chronic pain of right knee We will need to exclude  osteoarthritis She is not open to cortisone injection or referral to orthopedic She has great dorsalis pedis pulses this and ABI performed in the clinic is negative for PAD.  She has been reassured. - DG Knee Complete 4 Views Right; Future - POCT ABI Screening for Pilot No Charge  2. Encounter for screening mammogram for malignant neoplasm of breast - MM DIGITAL SCREENING BILATERAL; Future  3. Screening for colon cancer - Cologuard  4. Estrogen deficiency - DG BONE DENSITY (DXA); Future  5. Essential hypertension Slightly above goal Blood pressure was normal at last visit hence I will make no regimen change today. Counseled on blood pressure goal of less than 130/80, low-sodium, DASH diet, medication compliance, 150 minutes of moderate intensity exercise per week. Discussed medication compliance, adverse effects.  6. Need for immunization against influenza - Flu Vaccine QUAD 51moIM (Fluarix, Fluzone & Alfiuria Quad PF)   No orders of the defined types were placed in this encounter.   Follow-up: Return for follow up, keep previously scheduled appointment.       ECharlott Rakes MD, FAAFP. [facility] WCircle [place]ines[place]  11/10/2021, 1:11  PM

## 2021-11-10 NOTE — Addendum Note (Signed)
Addended byCharlott Rakes on: 11/10/2021 01:12 PM   Modules accepted: Level of Service

## 2021-11-18 ENCOUNTER — Other Ambulatory Visit: Payer: Self-pay | Admitting: Family Medicine

## 2021-11-18 DIAGNOSIS — I10 Essential (primary) hypertension: Secondary | ICD-10-CM

## 2021-11-18 DIAGNOSIS — F419 Anxiety disorder, unspecified: Secondary | ICD-10-CM

## 2021-11-18 DIAGNOSIS — M19041 Primary osteoarthritis, right hand: Secondary | ICD-10-CM

## 2021-11-18 DIAGNOSIS — E7800 Pure hypercholesterolemia, unspecified: Secondary | ICD-10-CM

## 2021-11-18 NOTE — Telephone Encounter (Signed)
Medication Refill - Medication: amLODipine (NORVASC) 10 MG tablet, atorvastatin (LIPITOR) 20 MG tablet, escitalopram (LEXAPRO) 20 MG tablet, hydrOXYzine (ATARAX) 25 MG tablet, isosorbide mononitrate (IMDUR) 30 MG 24 hr tablet, lisinopril (ZESTRIL) 40 MG tablet, meloxicam (MOBIC) 7.5 MG tablet, propranolol ER (INDERAL LA) 80 MG 24 hr capsule, traZODone (DESYREL) 50 MG tablet  Has the patient contacted their pharmacy? Yes.     Preferred Pharmacy (with phone number or street name):  [facility] ([facility]) - [place], [place] Phone:  [phone]  Fax:  [phone]     Has the patient been seen for an appointment in the last year OR does the patient have an upcoming appointment? Yes.

## 2021-11-19 MED ORDER — PROPRANOLOL HCL ER 80 MG PO CP24: 80.0000 mg | ORAL_CAPSULE | Freq: Every day | ORAL | 0 refills | Status: DC

## 2021-11-19 MED ORDER — ATORVASTATIN CALCIUM 20 MG PO TABS: 20.0000 mg | ORAL_TABLET | Freq: Every day | ORAL | 2 refills | Status: DC

## 2021-11-19 MED ORDER — ESCITALOPRAM OXALATE 20 MG PO TABS: ORAL_TABLET | Freq: Every day | ORAL | 0 refills | Status: DC

## 2021-11-19 MED ORDER — AMLODIPINE BESYLATE 10 MG PO TABS: 10.0000 mg | ORAL_TABLET | Freq: Every day | ORAL | 0 refills | Status: DC

## 2021-11-19 MED ORDER — LISINOPRIL 40 MG PO TABS: 40.0000 mg | ORAL_TABLET | Freq: Every day | ORAL | 0 refills | Status: DC

## 2021-11-19 MED ORDER — ISOSORBIDE MONONITRATE ER 30 MG PO TB24: 30.0000 mg | ORAL_TABLET | Freq: Every day | ORAL | 0 refills | Status: DC

## 2021-11-19 MED ORDER — HYDROXYZINE HCL 25 MG PO TABS: ORAL_TABLET | Freq: Three times a day (TID) | ORAL | 2 refills | Status: DC | PRN

## 2021-11-19 MED ORDER — MELOXICAM 7.5 MG PO TABS: 7.5000 mg | ORAL_TABLET | Freq: Every day | ORAL | 1 refills | Status: DC

## 2021-11-19 MED ORDER — TRAZODONE HCL 50 MG PO TABS: ORAL_TABLET | Freq: Every day | ORAL | 0 refills | Status: DC

## 2021-11-19 NOTE — Telephone Encounter (Signed)
Requested medication (s) are due for refill today: meloxicam - yes atorvastatin no (but is switching pharmacy to mail order  Requested medication (s) are on the active medication list: yes  Last refill:  meloxicam: 10/13/21 #30       atorvastatin 10/29/21  Future visit scheduled: yes  Notes to clinic:  meloxicam -sending back for approval or refusal (note attached was for pt to use sparingly) Overdue lab work for atorvastatin- was previously refill but according to protocol sending back for refusal or reordering   Requested Prescriptions  Pending Prescriptions Disp Refills   meloxicam (MOBIC) 7.5 MG tablet 30 tablet     Sig: Take 1 tablet (7.5 mg total) by mouth daily.     Analgesics:  COX2 Inhibitors Failed - 11/18/2021  5:35 PM      Failed - Manual Review: Labs are only required if the patient has taken medication for more than 8 weeks.      Failed - HGB in normal range and within 360 days    Hemoglobin  Date Value Ref Range Status  12/17/2016 13.7 11.1 - 15.9 g/dL Final         Failed - Cr in normal range and within 360 days    Creat  Date Value Ref Range Status  10/21/2015 0.90 0.50 - 0.99 mg/dL Final    Comment:      For patients > or = 70 years of age: The upper reference limit for Creatinine is approximately 13% higher for people identified as African-American.      Creatinine, Ser  Date Value Ref Range Status  07/07/2021 1.26 (H) 0.57 - 1.00 mg/dL Final         Failed - HCT in normal range and within 360 days    HCT  Date Value Ref Range Status  08/12/2011 38 % Final   Hematocrit  Date Value Ref Range Status  12/17/2016 41.7 34.0 - 46.6 % Final         Failed - AST in normal range and within 360 days    AST  Date Value Ref Range Status  01/07/2021 43 (H) 0 - 40 IU/L Final  08/12/2011 33 U/L Final         Failed - ALT in normal range and within 360 days    ALT  Date Value Ref Range Status  01/07/2021 43 (H) 0 - 32 IU/L Final         Passed - eGFR  is 30 or above and within 360 days    GFR, Est African American  Date Value Ref Range Status  10/21/2015 78 >=60 mL/min Final   GFR calc Af Amer  Date Value Ref Range Status  01/09/2020 69 >59 mL/min/1.73 Final    Comment:    **In accordance with recommendations from the NKF-ASN Task force,**   [facility] is in the process of updating its eGFR calculation to the   2021 CKD-EPI creatinine equation that estimates kidney function   without a race variable.    GFR, Est Non African American  Date Value Ref Range Status  10/21/2015 68 >=60 mL/min Final   GFR calc non Af Amer  Date Value Ref Range Status  01/09/2020 60 >59 mL/min/1.73 Final   eGFR  Date Value Ref Range Status  07/07/2021 46 (L) >59 mL/min/1.73 Final         Passed - Patient is not pregnant      Passed - Valid encounter within last 12 months  Recent Outpatient Visits           1 week ago Chronic pain of right knee   [place], Enobong, MD   4 months ago Screening for colon cancer   [place], Enobong, MD   10 months ago Need for hepatitis C screening test   [place] Charlott Rakes, MD   1 year ago Screening for colon cancer   [place] Charlott Rakes, MD   1 year ago Screening for colon cancer   [place], Charlane Ferretti, MD       Future Appointments             In 1 month Charlott Rakes, MD [place]             atorvastatin (LIPITOR) 20 MG tablet 100 tablet     Sig: Take 1 tablet (20 mg total) by mouth daily.     Cardiovascular:  Antilipid - Statins Failed - 11/18/2021  5:35 PM      Failed - Lipid Panel in normal range within the last 12 months    Cholesterol, Total  Date Value Ref Range Status  07/15/2020 153 100 - 199 mg/dL Final   LDL Chol Calc (NIH)  Date Value Ref Range Status   07/15/2020 70 0 - 99 mg/dL Final   HDL  Date Value Ref Range Status  07/15/2020 64 >39 mg/dL Final   Triglycerides  Date Value Ref Range Status  07/15/2020 104 0 - 149 mg/dL Final         Passed - Patient is not pregnant      Passed - Valid encounter within last 12 months    Recent Outpatient Visits           1 week ago Chronic pain of right knee   [place], Enobong, MD   4 months ago Screening for colon cancer   [place], Charlane Ferretti, MD   10 months ago Need for hepatitis C screening test   [place], Enobong, MD   1 year ago Screening for colon cancer   [place], Enobong, MD   1 year ago Screening for colon cancer   [place], Enobong, MD       Future Appointments             In 1 month Charlott Rakes, MD [place]            Signed Prescriptions Disp Refills   traZODone (DESYREL) 50 MG tablet 90 tablet 0    Sig: TAKE 1 TABLET (50 MG TOTAL) BY MOUTH AT BEDTIME.     Psychiatry: Antidepressants - Serotonin Modulator Failed - 11/18/2021  5:35 PM      Failed - Completed PHQ-2 or PHQ-9 in the last 360 days      Passed - Valid encounter within last 6 months    Recent Outpatient Visits           1 week ago Chronic pain of right knee   [place], Enobong, MD   4 months ago Screening for colon cancer   [place], Enobong, MD   10  months ago Need for hepatitis C screening test   [place], MD   1 year ago Screening for colon cancer   [place] Charlott Rakes, MD   1 year ago Screening for colon cancer   [place], Charlane Ferretti, MD       Future  Appointments             In 1 month Charlott Rakes, MD [place]             propranolol ER (INDERAL LA) 80 MG 24 hr capsule 100 capsule 0    Sig: Take 1 capsule (80 mg total) by mouth at bedtime.     Cardiovascular:  Beta Blockers Failed - 11/18/2021  5:35 PM      Failed - Last BP in normal range    BP Readings from Last 1 Encounters:  11/10/21 (!) 146/67         Passed - Last Heart Rate in normal range    Pulse Readings from Last 1 Encounters:  11/10/21 (!) 56         Passed - Valid encounter within last 6 months    Recent Outpatient Visits           1 week ago Chronic pain of right knee   [place], Charlane Ferretti, MD   4 months ago Screening for colon cancer   [place], Charlane Ferretti, MD   10 months ago Need for hepatitis C screening test   [place], Enobong, MD   1 year ago Screening for colon cancer   [place], Enobong, MD   1 year ago Screening for colon cancer   [place], Charlane Ferretti, MD       Future Appointments             In 1 month Charlott Rakes, MD [place]             lisinopril (ZESTRIL) 40 MG tablet 100 tablet 0    Sig: Take 1 tablet (40 mg total) by mouth daily.     Cardiovascular:  ACE Inhibitors Failed - 11/18/2021  5:35 PM      Failed - Cr in normal range and within 180 days    Creat  Date Value Ref Range Status  10/21/2015 0.90 0.50 - 0.99 mg/dL Final    Comment:      For patients > or = 70 years of age: The upper reference limit for Creatinine is approximately 13% higher for people identified as African-American.      Creatinine, Ser  Date Value Ref Range Status  07/07/2021 1.26 (H) 0.57 - 1.00 mg/dL Final         Failed - Last BP in normal range    BP Readings from Last 1  Encounters:  11/10/21 (!) 146/67         Passed - K in normal range and within 180 days    Potassium  Date Value Ref Range Status  07/07/2021 4.2 3.5 - 5.2 mmol/L Final         Passed - Patient is not pregnant      Passed - Valid encounter within last 6 months    Recent Outpatient Visits  1 week ago Chronic pain of right knee   [place], Enobong, MD   4 months ago Screening for colon cancer   [place], Enobong, MD   10 months ago Need for hepatitis C screening test   [place] Charlott Rakes, MD   1 year ago Screening for colon cancer   [place], Enobong, MD   1 year ago Screening for colon cancer   [place], Charlane Ferretti, MD       Future Appointments             In 1 month Charlott Rakes, MD [place]             isosorbide mononitrate (IMDUR) 30 MG 24 hr tablet 90 tablet 0    Sig: Take 1 tablet (30 mg total) by mouth daily.     Cardiovascular:  Nitrates Failed - 11/18/2021  5:35 PM      Failed - Last BP in normal range    BP Readings from Last 1 Encounters:  11/10/21 (!) 146/67         Passed - Last Heart Rate in normal range    Pulse Readings from Last 1 Encounters:  11/10/21 (!) 56         Passed - Valid encounter within last 12 months    Recent Outpatient Visits           1 week ago Chronic pain of right knee   [place], Charlane Ferretti, MD   4 months ago Screening for colon cancer   [place], Charlane Ferretti, MD   10 months ago Need for hepatitis C screening test   [place], Enobong, MD   1 year ago Screening for colon cancer   [place] Charlott Rakes, MD   1 year ago Screening for  colon cancer   [place], Enobong, MD       Future Appointments             In 1 month Charlott Rakes, MD [place]             hydrOXYzine (ATARAX) 25 MG tablet 90 tablet 2    Sig: TAKE 1 TABLET (25 MG TOTAL) BY MOUTH 3 (THREE) TIMES DAILY AS NEEDED.     Ear, Nose, and Throat:  Antihistamines 2 Failed - 11/18/2021  5:35 PM      Failed - Cr in normal range and within 360 days    Creat  Date Value Ref Range Status  10/21/2015 0.90 0.50 - 0.99 mg/dL Final    Comment:      For patients > or = 70 years of age: The upper reference limit for Creatinine is approximately 13% higher for people identified as African-American.      Creatinine, Ser  Date Value Ref Range Status  07/07/2021 1.26 (H) 0.57 - 1.00 mg/dL Final         Passed - Valid encounter within last 12 months    Recent Outpatient Visits           1 week ago Chronic pain of right knee   [place], Enobong, MD   4 months  ago Screening for colon cancer   [place], [place], MD   10 months ago Need for hepatitis C screening test   [place] Charlott Rakes, MD   1 year ago Screening for colon cancer   [place] Charlott Rakes, MD   1 year ago Screening for colon cancer   [place], Enobong, MD       Future Appointments             In 1 month Charlott Rakes, MD [place]             escitalopram (LEXAPRO) 20 MG tablet 90 tablet 0    Sig: TAKE 1 TABLET (20 MG TOTAL) BY MOUTH DAILY.     Psychiatry:  Antidepressants - SSRI Failed - 11/18/2021  5:35 PM      Failed - Completed PHQ-2 or PHQ-9 in the last 360 days      Passed - Valid encounter within last 6 months    Recent Outpatient Visits           1 week ago Chronic  pain of right knee   [place], Enobong, MD   4 months ago Screening for colon cancer   [place], Enobong, MD   10 months ago Need for hepatitis C screening test   [place] Charlott Rakes, MD   1 year ago Screening for colon cancer   [place], Enobong, MD   1 year ago Screening for colon cancer   [place], Charlane Ferretti, MD       Future Appointments             In 1 month Charlott Rakes, MD [place]             amLODipine (NORVASC) 10 MG tablet 100 tablet 0    Sig: Take 1 tablet (10 mg total) by mouth daily.     Cardiovascular: Calcium Channel Blockers 2 Failed - 11/18/2021  5:35 PM      Failed - Last BP in normal range    BP Readings from Last 1 Encounters:  11/10/21 (!) 146/67         Passed - Last Heart Rate in normal range    Pulse Readings from Last 1 Encounters:  11/10/21 (!) 56         Passed - Valid encounter within last 6 months    Recent Outpatient Visits           1 week ago Chronic pain of right knee   [place], Charlane Ferretti, MD   4 months ago Screening for colon cancer   [place], Charlane Ferretti, MD   10 months ago Need for hepatitis C screening test   [place], Enobong, MD   1 year ago Screening for colon cancer   [place], MD   1 year ago Screening for colon cancer   [place], Enobong, MD       Future Appointments             In 1 month Charlott Rakes, MD [place]  [place]

## 2021-11-19 NOTE — Telephone Encounter (Signed)
Change of pharmacy Requested Prescriptions  Pending Prescriptions Disp Refills  . traZODone (DESYREL) 50 MG tablet 90 tablet 0    Sig: TAKE 1 TABLET (50 MG TOTAL) BY MOUTH AT BEDTIME.     Psychiatry: Antidepressants - Serotonin Modulator Failed - 11/18/2021  5:35 PM      Failed - Completed PHQ-2 or PHQ-9 in the last 360 days      Passed - Valid encounter within last 6 months    Recent Outpatient Visits          1 week ago Chronic pain of right knee   [place], Enobong, MD   4 months ago Screening for colon cancer   [place], Enobong, MD   10 months ago Need for hepatitis C screening test   [place], Enobong, MD   1 year ago Screening for colon cancer   [place], MD   1 year ago Screening for colon cancer   [place], MD      Future Appointments            In 1 month Charlott Rakes, MD [place]           . meloxicam (MOBIC) 7.5 MG tablet 30 tablet     Sig: Take 1 tablet (7.5 mg total) by mouth daily.     Analgesics:  COX2 Inhibitors Failed - 11/18/2021  5:35 PM      Failed - Manual Review: Labs are only required if the patient has taken medication for more than 8 weeks.      Failed - HGB in normal range and within 360 days    Hemoglobin  Date Value Ref Range Status  12/17/2016 13.7 11.1 - 15.9 g/dL Final         Failed - Cr in normal range and within 360 days    Creat  Date Value Ref Range Status  10/21/2015 0.90 0.50 - 0.99 mg/dL Final    Comment:      For patients > or = 70 years of age: The upper reference limit for Creatinine is approximately 13% higher for people identified as African-American.      Creatinine, Ser  Date Value Ref Range Status  07/07/2021 1.26 (H) 0.57 - 1.00 mg/dL Final         Failed -  HCT in normal range and within 360 days    HCT  Date Value Ref Range Status  08/12/2011 38 % Final   Hematocrit  Date Value Ref Range Status  12/17/2016 41.7 34.0 - 46.6 % Final         Failed - AST in normal range and within 360 days    AST  Date Value Ref Range Status  01/07/2021 43 (H) 0 - 40 IU/L Final  08/12/2011 33 U/L Final         Failed - ALT in normal range and within 360 days    ALT  Date Value Ref Range Status  01/07/2021 43 (H) 0 - 32 IU/L Final         Passed - eGFR is 30 or above and within 360 days    GFR, Est African American  Date Value Ref Range Status  10/21/2015 78 >=60 mL/min Final   GFR calc Af Amer  Date Value Ref Range Status  01/09/2020  69 >59 mL/min/1.73 Final    Comment:    **In accordance with recommendations from the NKF-ASN Task force,**   [facility] is in the process of updating its eGFR calculation to the   2021 CKD-EPI creatinine equation that estimates kidney function   without a race variable.    GFR, Est Non African American  Date Value Ref Range Status  10/21/2015 68 >=60 mL/min Final   GFR calc non Af Amer  Date Value Ref Range Status  01/09/2020 60 >59 mL/min/1.73 Final   eGFR  Date Value Ref Range Status  07/07/2021 46 (L) >59 mL/min/1.73 Final         Passed - Patient is not pregnant      Passed - Valid encounter within last 12 months    Recent Outpatient Visits          1 week ago Chronic pain of right knee   [facility] [place] [facility], Charlane Ferretti, MD   4 months ago Screening for colon cancer   [place], Enobong, MD   10 months ago Need for hepatitis C screening test   [place], Enobong, MD   1 year ago Screening for colon cancer   [place], MD   1 year ago Screening for colon cancer   [place], MD      Future  Appointments            In 1 month Charlott Rakes, MD [place]           . propranolol ER (INDERAL LA) 80 MG 24 hr capsule 100 capsule 0    Sig: Take 1 capsule (80 mg total) by mouth at bedtime.     Cardiovascular:  Beta Blockers Failed - 11/18/2021  5:35 PM      Failed - Last BP in normal range    BP Readings from Last 1 Encounters:  11/10/21 (!) 146/67         Passed - Last Heart Rate in normal range    Pulse Readings from Last 1 Encounters:  11/10/21 (!) 56         Passed - Valid encounter within last 6 months    Recent Outpatient Visits          1 week ago Chronic pain of right knee   [place], Charlane Ferretti, MD   4 months ago Screening for colon cancer   [place], Charlane Ferretti, MD   10 months ago Need for hepatitis C screening test   [place], Enobong, MD   1 year ago Screening for colon cancer   [place], MD   1 year ago Screening for colon cancer   [place], MD      Future Appointments            In 1 month Charlott Rakes, MD [place]           . lisinopril (ZESTRIL) 40 MG tablet 100 tablet 0    Sig: Take 1 tablet (40 mg total) by mouth daily.     Cardiovascular:  ACE Inhibitors Failed - 11/18/2021  5:35 PM      Failed - Cr  in normal range and within 180 days    Creat  Date Value Ref Range Status  10/21/2015 0.90 0.50 - 0.99 mg/dL Final    Comment:      For patients > or = 70 years of age: The upper reference limit for Creatinine is approximately 13% higher for people identified as African-American.      Creatinine, Ser  Date Value Ref Range Status  07/07/2021 1.26 (H) 0.57 - 1.00 mg/dL Final         Failed - Last BP in normal range    BP Readings from Last 1 Encounters:   11/10/21 (!) 146/67         Passed - K in normal range and within 180 days    Potassium  Date Value Ref Range Status  07/07/2021 4.2 3.5 - 5.2 mmol/L Final         Passed - Patient is not pregnant      Passed - Valid encounter within last 6 months    Recent Outpatient Visits          1 week ago Chronic pain of right knee   [place], Charlane Ferretti, MD   4 months ago Screening for colon cancer   [place], Enobong, MD   10 months ago Need for hepatitis C screening test   [place], Enobong, MD   1 year ago Screening for colon cancer   [place], MD   1 year ago Screening for colon cancer   [place], MD      Future Appointments            In 1 month Charlott Rakes, MD [place]           . isosorbide mononitrate (IMDUR) 30 MG 24 hr tablet 90 tablet 0    Sig: Take 1 tablet (30 mg total) by mouth daily.     Cardiovascular:  Nitrates Failed - 11/18/2021  5:35 PM      Failed - Last BP in normal range    BP Readings from Last 1 Encounters:  11/10/21 (!) 146/67         Passed - Last Heart Rate in normal range    Pulse Readings from Last 1 Encounters:  11/10/21 (!) 56         Passed - Valid encounter within last 12 months    Recent Outpatient Visits          1 week ago Chronic pain of right knee   [place], Charlane Ferretti, MD   4 months ago Screening for colon cancer   [place], Charlane Ferretti, MD   10 months ago Need for hepatitis C screening test   [place], Enobong, MD   1 year ago Screening for colon cancer   [place], MD   1 year ago Screening for colon cancer   [place], MD      Future Appointments            In 1 month Charlott Rakes, MD [place]           . hydrOXYzine (ATARAX) 25 MG tablet 90  tablet 2    Sig: TAKE 1 TABLET (25 MG TOTAL) BY MOUTH 3 (THREE) TIMES DAILY AS NEEDED.     Ear, Nose, and Throat:  Antihistamines 2 Failed - 11/18/2021  5:35 PM      Failed - Cr in normal range and within 360 days    Creat  Date Value Ref Range Status  10/21/2015 0.90 0.50 - 0.99 mg/dL Final    Comment:      For patients > or = 70 years of age: The upper reference limit for Creatinine is approximately 13% higher for people identified as African-American.      Creatinine, Ser  Date Value Ref Range Status  07/07/2021 1.26 (H) 0.57 - 1.00 mg/dL Final         Passed - Valid encounter within last 12 months    Recent Outpatient Visits          1 week ago Chronic pain of right knee   [facility], MD   4 months ago Screening for colon cancer   [place] H[facility] Register, MD   10 months ago Need for hepatitis C screening test   [facility] Hoy Register, MD   1 year ago Screening for colon cancer   [place] [facility] Hoy Register, MD   1 year ago Screening for colon cancer   [place] [facility] Hoy Register, MD      Future Appointments            In 1 month Hoy Register, MD [facility]           . escitalopram (LEXAPRO) 20 MG tablet 90 tablet 0    Sig: TAKE 1 TABLET (20 MG TOTAL) BY MOUTH DAILY.     Psychiatry:  Antidepressants - SSRI Failed - 11/18/2021  5:35 PM      Failed - Completed PHQ-2 or PHQ-9 in the last 360 days      Passed - Valid encounter within last 6 months    Recent Outpatient Visits          1 week ago Chronic pain of right knee   [place]  [facility] Hoy Register, MD   4 months ago Screening for colon cancer   [facility] [facility] Hoy Register, MD   10 months ago Need for hepatitis C screening test   [facility] Hoy Register, MD   1 year ago Screening for colon cancer   [place] [facility] Hoy Register, MD   1 year ago Screening for colon cancer    [facility] H[facility] Register, MD      Future Appointments            In 1 month Hoy Register, MD [facility]           . atorvastatin (LIPITOR) 20 MG tablet 100 tablet     Sig: Take 1 tablet (20 mg total) by mouth daily.     Cardiovascular:  Antilipid - Statins Failed - 11/18/2021  5:35 PM      Failed - Lipid Panel in normal range within the last 12 months    Cholesterol, Total  Date Value Ref Range Status  07/15/2020 153 100 - 199 mg/dL Final   LDL Chol Calc (NIH)  Date Value Ref Range Status  07/15/2020 70 0 - 99 mg/dL Final   HDL  Date Value Ref Range Status  07/15/2020 64 >39 mg/dL Final   Triglycerides  Date Value Ref Range Status  07/15/2020 104 0 - 149 mg/dL Final         Passed - Patient is not pregnant      Passed - Valid encounter within last 12 months    Recent Outpatient Visits          1 week ago Chronic pain of right knee   [place], Charlane Ferretti, MD   4 months ago Screening for colon cancer   [place], Enobong, MD   10 months ago Need for hepatitis C screening test   [place], Enobong, MD   1 year ago Screening for colon cancer   [place] Charlott Rakes, MD   1 year ago Screening for colon cancer   [place], MD      Future Appointments            In 1 month Charlott Rakes, MD  [place]           . amLODipine (NORVASC) 10 MG tablet 100 tablet 0    Sig: Take 1 tablet (10 mg total) by mouth daily.     Cardiovascular: Calcium Channel Blockers 2 Failed - 11/18/2021  5:35 PM      Failed - Last BP in normal range    BP Readings from Last 1 Encounters:  11/10/21 (!) 146/67         Passed - Last Heart Rate in normal range    Pulse Readings from Last 1 Encounters:  11/10/21 (!) 56         Passed - Valid encounter within last 6 months    Recent Outpatient Visits          1 week ago Chronic pain of right knee   [place], Charlane Ferretti, MD   4 months ago Screening for colon cancer   [place], Charlane Ferretti, MD   10 months ago Need for hepatitis C screening test   [place], Enobong, MD   1 year ago Screening for colon cancer   [place], MD   1 year ago Screening for colon cancer   [place], MD      Future Appointments            In 1 month Charlott Rakes, MD [place]

## 2022-01-05 ENCOUNTER — Other Ambulatory Visit: Payer: Self-pay | Admitting: Family Medicine

## 2022-01-05 DIAGNOSIS — M19041 Primary osteoarthritis, right hand: Secondary | ICD-10-CM

## 2022-01-07 ENCOUNTER — Encounter: Payer: Self-pay | Admitting: Family Medicine

## 2022-01-07 ENCOUNTER — Ambulatory Visit: Payer: Medicare Other | Attending: Family Medicine | Admitting: Family Medicine

## 2022-01-07 VITALS — BP 157/75 | HR 60 | Wt 157.4 lb

## 2022-01-07 DIAGNOSIS — F419 Anxiety disorder, unspecified: Secondary | ICD-10-CM

## 2022-01-07 DIAGNOSIS — M19041 Primary osteoarthritis, right hand: Secondary | ICD-10-CM | POA: Diagnosis not present

## 2022-01-07 DIAGNOSIS — Z1211 Encounter for screening for malignant neoplasm of colon: Secondary | ICD-10-CM

## 2022-01-07 DIAGNOSIS — F32A Depression, unspecified: Secondary | ICD-10-CM

## 2022-01-07 DIAGNOSIS — I10 Essential (primary) hypertension: Secondary | ICD-10-CM | POA: Diagnosis not present

## 2022-01-07 DIAGNOSIS — M19042 Primary osteoarthritis, left hand: Secondary | ICD-10-CM

## 2022-01-07 MED ORDER — TRAZODONE HCL 50 MG PO TABS: ORAL_TABLET | Freq: Every day | ORAL | 3 refills | Status: DC

## 2022-01-07 MED ORDER — AMLODIPINE BESYLATE 10 MG PO TABS: 10.0000 mg | ORAL_TABLET | Freq: Every day | ORAL | 3 refills | Status: DC

## 2022-01-07 MED ORDER — ESCITALOPRAM OXALATE 20 MG PO TABS: ORAL_TABLET | Freq: Every day | ORAL | 3 refills | Status: DC

## 2022-01-07 MED ORDER — MELOXICAM 7.5 MG PO TABS: 7.5000 mg | ORAL_TABLET | Freq: Every day | ORAL | 1 refills | Status: DC

## 2022-01-07 MED ORDER — LISINOPRIL 40 MG PO TABS: 40.0000 mg | ORAL_TABLET | Freq: Every day | ORAL | 3 refills | Status: DC

## 2022-01-07 MED ORDER — HYDROXYZINE HCL 25 MG PO TABS: ORAL_TABLET | Freq: Three times a day (TID) | ORAL | 2 refills | Status: DC | PRN

## 2022-01-07 NOTE — Progress Notes (Signed)
Subjective:  Patient ID: Raj Janus, female    DOB: 1950-09-27  Age: 71 y.o. MRN: [id number]  CC: Medication Refill   HPI EULAMAE GREENSTEIN is a 71 y.o. year old female with a history of hypertension, anxiety and depression, history of breast cancer (status post left mastectomy over 16 years ago) osteoarthritis of the hand.   Interval History:  She continues to have pain in her hands and she is on Meloxicam for it. Pain is minimal at the moment but worse with cold and rainy days.  BP is elevated and she is yet to take her antihypertensive.  She endorses exercising regularly and adhering to low-sodium diet. Her anxiety and depression are controlled on Lexapro and trazodone.  Trazodone also helps with her insomnia.  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   Hypercholesteremia    dx 2010   Hypertension    dx 1996   Leukopenia 07/10/2011   Stable compared to 2012    Past Surgical History:  Procedure Laterality Date   ABDOMINAL HYSTERECTOMY     late 1990s.   BREAST SURGERY Left    2004   MASTECTOMY  2005   left side    Family History  Problem Relation Age of Onset   Hypertension Mother    Arthritis Sister        RA    Social History   Socioeconomic History   Marital status: Divorced    Spouse name: Not on file   Number of children: Not on file   Years of education: Not on file   Highest education level: Not on file  Occupational History   Not on file  Tobacco Use   Smoking status: Former    Types: Cigarettes    Quit date: 03/20/1994    Years since quitting: 27.8   Smokeless tobacco: Never  Substance and Sexual Activity   Alcohol use: No   Drug use: No   Sexual activity: Yes    Birth control/protection: Condom  Other Topics  Concern   Not on file  Social History Narrative   Recently separated from husband of 9 years.    Social Determinants of Health   Financial Resource Strain: Not on file  Food Insecurity: Not on file  Transportation Needs: Not on file  Physical Activity: Not on file  Stress: Not on file  Social Connections: Not on file    Allergies  Allergen Reactions   Fish Allergy Shortness Of Breath   Strawberry Extract Shortness Of Breath   Aspirin Nausea Only    Outpatient Medications Prior to Visit  Medication Sig Dispense Refill   acetaminophen (TYLENOL) 325 MG tablet Take 2 tablets (650 mg total) by mouth every 6 (six) hours as needed. 30 tablet 0   atorvastatin (LIPITOR) 20 MG tablet Take 1 tablet (20 mg total) by mouth daily. 100 tablet 2   cetirizine (ZYRTEC) 10 MG tablet Take 1 tablet (10 mg total) by mouth daily. 30 tablet 6   Elastic Bandages & Supports (WRIST SPLINT/ELASTIC LEFT MED) MISC 1 each by Does not apply route daily. 1 each 0   Elastic Bandages & Supports (WRIST SPLINT/ELASTIC RIGHT MED) MISC 1 each by Does not apply route daily. 1 each 0   isosorbide  mononitrate (IMDUR) 30 MG 24 hr tablet Take 1 tablet (30 mg total) by mouth daily. 90 tablet 0   predniSONE (DELTASONE) 20 MG tablet Take 1 tablet (20 mg total) by mouth daily with breakfast. 5 tablet 0   propranolol ER (INDERAL LA) 80 MG 24 hr capsule Take 1 capsule (80 mg total) by mouth at bedtime. 100 capsule 0   amLODipine (NORVASC) 10 MG tablet Take 1 tablet (10 mg total) by mouth daily. 100 tablet 0   escitalopram (LEXAPRO) 20 MG tablet TAKE 1 TABLET (20 MG TOTAL) BY MOUTH DAILY. 90 tablet 0   hydrOXYzine (ATARAX) 25 MG tablet TAKE 1 TABLET (25 MG TOTAL) BY MOUTH 3 (THREE) TIMES DAILY AS NEEDED. 90 tablet 2   lisinopril (ZESTRIL) 40 MG tablet Take 1 tablet (40 mg total) by mouth daily. 100 tablet 0   meloxicam (MOBIC) 7.5 MG tablet Take 1 tablet (7.5 mg total) by mouth daily. 30 tablet 1   traZODone (DESYREL) 50 MG  tablet TAKE 1 TABLET (50 MG TOTAL) BY MOUTH AT BEDTIME. 90 tablet 0   No facility-administered medications prior to visit.     ROS Review of Systems  Constitutional:  Negative for activity change and appetite change.  HENT:  Negative for sinus pressure and sore throat.   Respiratory:  Negative for chest tightness, shortness of breath and wheezing.   Cardiovascular:  Negative for chest pain and palpitations.  Gastrointestinal:  Negative for abdominal distention, abdominal pain and constipation.  Genitourinary: Negative.   Musculoskeletal:        See HPI  Psychiatric/Behavioral:  Negative for behavioral problems and dysphoric mood.     Objective:  BP (!) 157/75   Pulse 60   Wt 157 lb 6.4 oz (71.4 kg)   SpO2 100%   BMI 27.02 kg/m      01/07/2022    4:22 PM 11/10/2021    8:50 AM 07/07/2021    4:02 PM  BP/Weight  Systolic BP 161 096 045  Diastolic BP 75 67 60  Wt. (Lbs) 157.4 151.8   BMI 27.02 kg/m2 26.06 kg/m2       Physical Exam Constitutional:      Appearance: She is well-developed.  Cardiovascular:     Rate and Rhythm: Normal rate.     Heart sounds: Normal heart sounds. No murmur heard. Pulmonary:     Effort: Pulmonary effort is normal.     Breath sounds: Normal breath sounds. No wheezing or rales.  Chest:     Chest wall: No tenderness.  Abdominal:     General: Bowel sounds are normal. There is no distension.     Palpations: Abdomen is soft. There is no mass.     Tenderness: There is no abdominal tenderness.  Musculoskeletal:        General: Normal range of motion.     Right lower leg: No edema.     Left lower leg: No edema.  Neurological:     Mental Status: She is alert and oriented to person, place, and time.  Psychiatric:        Mood and Affect: Mood normal.        Latest Ref Rng & Units 07/07/2021    4:33 PM 01/07/2021    9:15 AM 07/15/2020    8:54 AM  CMP  Glucose 70 - 99 mg/dL 70  115  117   BUN 8 - 27 mg/dL _0 Creatinine 0.57 - 1.00  mg/dL 1.26  0.87  0.87   Sodium 134 - 144 mmol/L 135  139  138   Potassium 3.5 - 5.2 mmol/L 4.2  4.5  4.5   Chloride 96 - 106 mmol/L 97  102  101   CO2 20 - 29 mmol/L _0 Calcium 8.7 - 10.3 mg/dL 10.7  10.0  9.5   Total Protein 6.0 - 8.5 g/dL  7.0  7.2   Total Bilirubin 0.0 - 1.2 mg/dL  0.4  0.4   Alkaline Phos 44 - 121 IU/L  78  70   AST 0 - 40 IU/L  43  47   ALT 0 - 32 IU/L  43  44     Lipid Panel     Component Value Date/Time   CHOL 153 07/15/2020 0854   TRIG 104 07/15/2020 0854   HDL 64 07/15/2020 0854   CHOLHDL 2.4 07/15/2020 0854   CHOLHDL 3.5 03/20/2014 1118   VLDL 20 03/20/2014 1118   LDLCALC 70 07/15/2020 0854    CBC    Component Value Date/Time   WBC 4.3 12/17/2016 1539   WBC 4.5 03/20/2014 1118   RBC 4.74 12/17/2016 1539   RBC 4.52 03/20/2014 1118   HGB 13.7 12/17/2016 1539   HCT 41.7 12/17/2016 1539   HCT 38 08/12/2011 1445   PLT 235 12/17/2016 1539   MCV 88 12/17/2016 1539   MCV 82.2 08/12/2011 1445   MCH 28.9 12/17/2016 1539   MCH 28.8 03/20/2014 1118   MCHC 32.9 12/17/2016 1539   MCHC 34.3 03/20/2014 1118   RDW 14.3 12/17/2016 1539   LYMPHSABS 2.1 12/17/2016 1539   MONOABS 0.3 07/10/2011 1327   EOSABS 0.1 12/17/2016 1539   BASOSABS 0.0 12/17/2016 1539    Lab Results  Component Value Date   HGBA1C 5.40 04/19/2015    Assessment & Plan:  1. Essential hypertension Uncontrolled due to the fact that she is yet to take her antihypertensive Adherence with medications has been emphasized No change in regimen Counseled on blood pressure goal of less than 130/80, low-sodium, DASH diet, medication compliance, 150 minutes of moderate intensity exercise per week. Discussed medication compliance, adverse effects. - amLODipine (NORVASC) 10 MG tablet; Take 1 tablet (10 mg total) by mouth daily.  Dispense: 100 tablet; Refill: 3 - lisinopril (ZESTRIL) 40 MG tablet; Take 1 tablet (40 mg total) by mouth daily.  Dispense: 100 tablet; Refill: 3 -  LP+Non-HDL Cholesterol - CMP14+EGFR  2. Anxiety and depression Controlled - escitalopram (LEXAPRO) 20 MG tablet; TAKE 1 TABLET (20 MG TOTAL) BY MOUTH DAILY.  Dispense: 90 tablet; Refill: 3 - hydrOXYzine (ATARAX) 25 MG tablet; TAKE 1 TABLET (25 MG TOTAL) BY MOUTH 3 (THREE) TIMES DAILY AS NEEDED.  Dispense: 90 tablet; Refill: 2 - traZODone (DESYREL) 50 MG tablet; TAKE 1 TABLET (50 MG TOTAL) BY MOUTH AT BEDTIME.  Dispense: 90 tablet; Refill: 3  3. Primary osteoarthritis of both hands Stable with intermittent flares Offered to refer to hand specialist for possible cortisone injections but she declined - meloxicam (MOBIC) 7.5 MG tablet; Take 1 tablet (7.5 mg total) by mouth daily.  Dispense: 90 tablet; Refill: 1 - CBC with Differential/Platelet  4. Screening for colon cancer - Cologuard    Meds ordered this encounter  Medications   amLODipine (NORVASC) 10 MG tablet    Sig: Take 1 tablet (10 mg total) by mouth daily.    Dispense:  100 tablet    Refill:  3    Please  send a replace/new response with 100-Day Supply if appropriate to maximize member benefit. Requesting 1 year supply.   escitalopram (LEXAPRO) 20 MG tablet    Sig: TAKE 1 TABLET (20 MG TOTAL) BY MOUTH DAILY.    Dispense:  90 tablet    Refill:  3   hydrOXYzine (ATARAX) 25 MG tablet    Sig: TAKE 1 TABLET (25 MG TOTAL) BY MOUTH 3 (THREE) TIMES DAILY AS NEEDED.    Dispense:  90 tablet    Refill:  2   lisinopril (ZESTRIL) 40 MG tablet    Sig: Take 1 tablet (40 mg total) by mouth daily.    Dispense:  100 tablet    Refill:  3    Please send a replace/new response with 100-Day Supply if appropriate to maximize member benefit. Requesting 1 year supply.   meloxicam (MOBIC) 7.5 MG tablet    Sig: Take 1 tablet (7.5 mg total) by mouth daily.    Dispense:  90 tablet    Refill:  1   traZODone (DESYREL) 50 MG tablet    Sig: TAKE 1 TABLET (50 MG TOTAL) BY MOUTH AT BEDTIME.    Dispense:  90 tablet    Refill:  3    Follow-up:  Return in about 6 months (around 07/08/2022) for Chronic medical conditions.       Charlott Rakes, MD, FAAFP. [facility] [place], [place]   01/07/2022, 6:01 PM

## 2022-01-07 NOTE — Patient Instructions (Signed)
Arthritis Arthritis is a term that is commonly used to refer to joint pain or joint disease. There are more than 100 types of arthritis. What are the causes? The most common cause of this condition is wear and tear of a joint. Other causes include: Gout. Inflammation of a joint. An infection of a joint. Sprains and other injuries near the joint. A reaction to medicines or drugs, or an allergic reaction. In some cases, the cause may not be known. What are the signs or symptoms? The main symptom of this condition is pain in the joint during movement. Other symptoms include: Redness, swelling, or stiffness at a joint. Warmth coming from the joint. Fever. Overall feeling of illness. How is this diagnosed? This condition may be diagnosed with a physical exam and tests, including: Blood tests. Urine tests. Imaging tests, such as X-rays, an MRI, or a CT scan. Sometimes, fluid is removed from a joint for testing. How is this treated? This condition may be treated with: Treatment of the cause, if it is known. Rest. Raising (elevating) the joint. Applying cold or hot packs to the joint. Medicines to improve symptoms and reduce inflammation. Injections of a steroid, such as cortisone, into the joint to help reduce pain and inflammation. Depending on the cause of your arthritis, you may need to make lifestyle changes to reduce stress on your joint. Changes may include: Exercising more. Losing weight. Follow these instructions at home: Medicines Take over-the-counter and prescription medicines only as told by your health care provider. Do not take aspirin to relieve pain if your health care provider thinks that gout may be causing your pain. Activity Rest your joint if told by your health care provider. Rest is important when your disease is active and your joint feels painful, swollen, or stiff. Avoid activities that make the pain worse. Balance activity with rest. Exercise your joint  regularly with range-of-motion exercises as told by your health care provider. Try doing low-impact exercise, such as: Swimming. Water aerobics. Biking. Walking. Managing pain, stiffness, and swelling     If directed, put ice on the affected joint. To do this: Put ice in a plastic bag. Place a towel between your skin and the bag. Leave the ice on for 20 minutes, 2-3 times a day. Remove the ice if your skin turns bright red. This is very important. If you cannot feel pain, heat, or cold, you have a greater risk of damage to the area. If your joint is swollen, raise (elevate) it above the level of your heart if directed by your health care provider. If your joint feels stiff in the morning, try taking a warm shower. If directed, apply heat to the affected area as often as told by your health care provider. Use the heat source that your health care provider recommends, such as a moist heat pack or a heating pad. To apply heat: Place a towel between your skin and the heat source. Leave the heat on for 20-30 minutes. Remove the heat if your skin turns bright red. This is especially important if you are unable to feel pain, heat, or cold. You have a greater risk of getting burned. General instructions Maintain a healthy weight. Follow instructions from your health care provider for weight control. Do not use any products that contain nicotine or tobacco. These products include cigarettes, chewing tobacco, and vaping devices, such as e-cigarettes. If you need help quitting, ask your health care provider. Keep all follow-up visits. This is important. Where   to find more information National Institutes of Health: www.niams.nih.gov Contact a health care provider if: The pain gets worse. You have a fever. Get help right away if: You develop severe joint pain, swelling, or redness. Many joints become painful and swollen. You develop severe back pain. You develop severe weakness in your  leg. Summary Arthritis is a term that is commonly used to refer to joint pain or joint disease. There are more than 100 types of arthritis. The most common cause of this condition is wear and tear of a joint. Other causes include gout, inflammation or infection of the joint, sprains, or allergies. Symptoms of this condition include redness, swelling, or stiffness of the joint. Other symptoms include warmth, fever, or feeling ill. This condition is treated with rest, elevation, medicines, and applying cold or hot packs. Follow your health care provider's instructions about medicines, activity, exercises, and other home care treatments. This information is not intended to replace advice given to you by your health care provider. Make sure you discuss any questions you have with your health care provider. Document Revised: 11/26/2020 Document Reviewed: 11/26/2020 Elsevier Patient Education  2023 Elsevier Inc.  

## 2022-01-08 LAB — CMP14+EGFR
ALT: 42 IU/L — ABNORMAL HIGH (ref 0–32)
AST: 60 IU/L — ABNORMAL HIGH (ref 0–40)
Albumin/Globulin Ratio: 1.9 (ref 1.2–2.2)
Albumin: 4.6 g/dL (ref 3.9–4.9)
Alkaline Phosphatase: 70 IU/L (ref 44–121)
BUN/Creatinine Ratio: 14 (ref 12–28)
BUN: 19 mg/dL (ref 8–27)
Bilirubin Total: 0.6 mg/dL (ref 0.0–1.2)
CO2: 24 mmol/L (ref 20–29)
Calcium: 10.8 mg/dL — ABNORMAL HIGH (ref 8.7–10.3)
Chloride: 99 mmol/L (ref 96–106)
Creatinine, Ser: 1.33 mg/dL — ABNORMAL HIGH (ref 0.57–1.00)
Globulin, Total: 2.4 g/dL (ref 1.5–4.5)
Glucose: 100 mg/dL — ABNORMAL HIGH (ref 70–99)
Potassium: 4.3 mmol/L (ref 3.5–5.2)
Sodium: 137 mmol/L (ref 134–144)
Total Protein: 7 g/dL (ref 6.0–8.5)
eGFR: 43 mL/min/{1.73_m2} — ABNORMAL LOW (ref >59)

## 2022-01-08 LAB — CBC WITH DIFFERENTIAL/PLATELET
Basophils Absolute: 0 10*3/uL (ref 0.0–0.2)
Basos: 1 %
EOS (ABSOLUTE): 0.2 10*3/uL (ref 0.0–0.4)
Eos: 3 %
Hematocrit: 34.1 % (ref 34.0–46.6)
Hemoglobin: 11.5 g/dL (ref 11.1–15.9)
Immature Grans (Abs): 0 10*3/uL (ref 0.0–0.1)
Immature Granulocytes: 0 %
Lymphocytes Absolute: 2.8 10*3/uL (ref 0.7–3.1)
Lymphs: 53 %
MCH: 29 pg (ref 26.6–33.0)
MCHC: 33.7 g/dL (ref 31.5–35.7)
MCV: 86 fL (ref 79–97)
Monocytes Absolute: 0.7 10*3/uL (ref 0.1–0.9)
Monocytes: 13 %
Neutrophils Absolute: 1.6 10*3/uL (ref 1.4–7.0)
Neutrophils: 30 %
Platelets: 171 10*3/uL (ref 150–450)
RBC: 3.97 x10E6/uL (ref 3.77–5.28)
RDW: 12.7 % (ref 11.7–15.4)
WBC: 5.2 10*3/uL (ref 3.4–10.8)

## 2022-01-08 LAB — LP+NON-HDL CHOLESTEROL
Cholesterol, Total: 155 mg/dL (ref 100–199)
HDL: 70 mg/dL (ref >39)
LDL Chol Calc (NIH): 60 mg/dL (ref 0–99)
Total Non-HDL-Chol (LDL+VLDL): 85 mg/dL (ref 0–129)
Triglycerides: 150 mg/dL — ABNORMAL HIGH (ref 0–149)
VLDL Cholesterol Cal: 25 mg/dL (ref 5–40)

## 2022-01-27 ENCOUNTER — Other Ambulatory Visit: Payer: Self-pay | Admitting: Family Medicine

## 2022-01-27 DIAGNOSIS — I10 Essential (primary) hypertension: Secondary | ICD-10-CM

## 2022-01-27 DIAGNOSIS — F32A Depression, unspecified: Secondary | ICD-10-CM

## 2022-01-27 DIAGNOSIS — F419 Anxiety disorder, unspecified: Secondary | ICD-10-CM

## 2022-02-13 ENCOUNTER — Ambulatory Visit: Payer: Medicare Other | Attending: Family Medicine

## 2022-02-13 DIAGNOSIS — Z0000 Encounter for general adult medical examination without abnormal findings: Secondary | ICD-10-CM | POA: Diagnosis not present

## 2022-02-13 NOTE — Progress Notes (Signed)
Subjective:   [name] is a 71 y.o. female who presents for Medicare Annual (Subsequent) preventive examination.  Review of Systems    connected with  [name] on  02/13/22 at  2:51 pm by telephone and verified that I am speaking with the correct person using two identifiers. I discussed the limitations, risks, security and privacy concerns of performing an evaluation and management service by telephone and the availability of in person appointments. I also discussed with the patient that there may be a patient responsible charge related to this service. The patient expressed understanding and agreed to proceed.  Patient location:  home  [facility]  Persons on the telephone call:   Myself [facility] ) and [name]        Objective:    There were no vitals filed for this visit. There is no height or weight on file to calculate BMI.     02/13/2022    2:54 PM 04/18/2019   11:07 AM 10/11/2017   12:32 PM 12/17/2016    1:44 PM 10/21/2015    2:22 PM 09/09/2015    9:09 AM 04/19/2015    3:10 PM  Advanced Directives  Does Patient Have a Medical Advance Directive? _0  No No  Would patient like information on creating a medical advance directive? No - Patient declined  No - Patient declined  No - patient declined information No - patient declined information     Current Medications (verified) Outpatient Encounter Medications as of 02/13/2022  Medication Sig   acetaminophen (TYLENOL) 325 MG tablet Take 2 tablets (650 mg total) by mouth every 6 (six) hours as needed.   amLODipine (NORVASC) 10 MG tablet Take 1 tablet (10 mg total) by mouth daily.   atorvastatin (LIPITOR) 20 MG tablet Take 1 tablet (20 mg total) by mouth daily.   escitalopram (LEXAPRO) 20 MG tablet TAKE 1 TABLET (20 MG TOTAL) BY MOUTH DAILY.   hydrOXYzine (ATARAX) 25 MG tablet TAKE 1 TABLET (25 MG TOTAL) BY MOUTH 3 (THREE) TIMES DAILY AS NEEDED.   lisinopril (ZESTRIL) 40 MG  tablet Take 1 tablet (40 mg total) by mouth daily.   meloxicam (MOBIC) 7.5 MG tablet Take 1 tablet (7.5 mg total) by mouth daily.   cetirizine (ZYRTEC) 10 MG tablet Take 1 tablet (10 mg total) by mouth daily.   Elastic Bandages & Supports (WRIST SPLINT/ELASTIC LEFT MED) MISC 1 each by Does not apply route daily.   Elastic Bandages & Supports (WRIST SPLINT/ELASTIC RIGHT MED) MISC 1 each by Does not apply route daily.   isosorbide mononitrate (IMDUR) 30 MG 24 hr tablet Take 1 tablet (30 mg total) by mouth daily.   predniSONE (DELTASONE) 20 MG tablet Take 1 tablet (20 mg total) by mouth daily with breakfast. (Patient not taking: Reported on 02/13/2022)   propranolol ER (INDERAL LA) 80 MG 24 hr capsule TAKE 1 CAPSULE BY MOUTH AT  BEDTIME   traZODone (DESYREL) 50 MG tablet TAKE 1 TABLET (50 MG TOTAL) BY MOUTH AT BEDTIME.   No facility-administered encounter medications on file as of 02/13/2022.    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  Hypercholesteremia    dx 2010   Hypertension    dx 1996   Leukopenia 07/10/2011   Stable compared to 2012   Past Surgical History:  Procedure Laterality Date   ABDOMINAL HYSTERECTOMY     late 1990s.   BREAST SURGERY Left    2004   MASTECTOMY  2005   left side   Family History  Problem Relation Age of Onset   Hypertension Mother    Arthritis Sister        RA   Social History   Socioeconomic History   Marital status: Divorced    Spouse name: Not on file   Number of children: Not on file   Years of education: Not on file   Highest education level: Not on file  Occupational History   Not on file  Tobacco Use   Smoking status: Former     Types: Cigarettes    Quit date: 03/20/1994    Years since quitting: 27.9   Smokeless tobacco: Never  Substance and Sexual Activity   Alcohol use: No   Drug use: No   Sexual activity: Yes    Birth control/protection: Condom  Other Topics Concern   Not on file  Social History Narrative   Recently separated from husband of 103 years.    Social Determinants of Health   Financial Resource Strain: Low Risk  (02/13/2022)   Overall Financial Resource Strain (CARDIA)    Difficulty of Paying Living Expenses: Not hard at all  Food Insecurity: No Food Insecurity (02/13/2022)   Hunger Vital Sign    Worried About Running Out of Food in the Last Year: Never true    Ran Out of Food in the Last Year: Never true  Transportation Needs: No Transportation Needs (02/13/2022)   PRAPARE - Hydrologist (Medical): No    Lack of Transportation (Non-Medical): No  Physical Activity: Sufficiently Active (02/13/2022)   Exercise Vital Sign    Days of Exercise per Week: 5 days    Minutes of Exercise per Session: 30 min  Stress: Stress Concern Present (02/13/2022)   [place]    Feeling of Stress : To some extent  Social Connections: Unknown (02/13/2022)   Social Connection and Isolation Panel [NHANES]    Frequency of Communication with Friends and Family: Three times a week    Frequency of Social Gatherings with Friends and Family: Three times a week    Attends Religious Services: Not on file    Active Member of Clubs or Organizations: Yes    Attends Archivist Meetings: Not on file    Marital Status: Divorced    Tobacco Counseling Counseling given: Not Answered   Clinical Intake:     Pain : No/denies pain     Diabetes: No     Diabetic?No         Activities of Daily Living    02/13/2022    2:39 PM  In your present state of health, do you have any difficulty performing the  following activities:  Hearing? 0  Vision? 0  Difficulty concentrating or making decisions? 0  Walking or climbing stairs? 0  Dressing or bathing? 0  Doing errands, shopping? 0  Preparing Food and eating ? N  Using the Toilet? N  In the past six months, have you accidently leaked urine? N  Do you have problems with loss of bowel control? N  Managing your Medications? N  Managing your Finances? Y  Housekeeping or managing your Housekeeping? N    Patient Care Team: Charlott Rakes, MD as PCP - General (Family Medicine) Danie Binder, MD (Inactive) (Gastroenterology)  Indicate any recent Medical Services you may have received from other than [facility] providers in the past year (date may be approximate).     Assessment:   This is a routine wellness examination for [facility].  Hearing/Vision screen No results found.  Dietary issues and exercise activities discussed:     Goals Addressed   None   Depression Screen    11/10/2021    8:57 AM 07/08/2020    3:30 PM 01/09/2020    4:16 PM 07/05/2019    3:24 PM 04/18/2019   11:07 AM 11/08/2018    2:25 PM 03/28/2018    2:22 PM  PHQ 2/9 Scores  PHQ - 2 Score  3  2 0 0 5  PHQ- 9 Score  11  6  0 17  Exception Documentation Patient refusal  Patient refusal        Fall Risk    02/13/2022    2:55 PM 01/07/2022    4:18 PM 11/10/2021    8:51 AM 07/07/2021    3:40 PM 01/07/2021    8:37 AM  Fall Risk   Falls in the past year? 1 0 0 0 0  Number falls in past yr: 1 0 0 0 0  Injury with Fall? 0 0 1 0 0  Risk for fall due to : Other (Comment) No Fall Risks       FALL RISK PREVENTION PERTAINING TO THE HOME:  Any stairs in or around the home? Yes  If so, are there any without handrails? Yes  Home free of loose throw rugs in walkways, pet beds, electrical cords, etc? Yes  Adequate lighting in your home to reduce risk of falls? Yes   ASSISTIVE DEVICES UTILIZED TO PREVENT FALLS:  Life alert? Yes  Use of a cane, walker or w/c? No  Grab bars in the  bathroom? No  Shower chair or bench in shower? No  Elevated toilet seat or a handicapped toilet? No   TIMED UP AND GO:  Was the test performed? Yes .  Length of time to ambulate 10 feet: 5 sec.   Gait slow and steady without use of assistive device  Cognitive Function:    02/13/2022    2:56 PM  MMSE - Mini Mental State Exam  Orientation to time 5  Orientation to Place 5  Registration 3  Attention/ Calculation 5  Recall 3  Language- name 2 objects 2  Language- repeat 1  Language- follow 3 step command 3  Language- read & follow direction 1  Write a sentence 1  Copy design 1  Total score 30        02/13/2022    2:58 PM  6CIT Screen  What Year? 0 points  What month? 0 points  Count back from 20 0 points  Months in reverse 0 points  Repeat phrase 0 points    Immunizations Immunization History  Administered Date(s) Administered   Influenza,inj,Quad PF,6+ Mos 11/27/2014, 10/21/2015, 12/21/2017, 11/09/2018, 01/09/2020, 11/10/2021   Influenza-Unspecified 10/31/2013   PFIZER(Purple Top)SARS-COV-2 Vaccination 05/04/2019, 05/30/2019   Pneumococcal Conjugate-13 06/01/2017   Pneumococcal Polysaccharide-23 04/18/2019   Tdap 06/01/2017   Zoster Recombinat (Shingrix) 04/18/2019, 07/17/2019    TDAP status: Up to date  Flu Vaccine status: Up to date  Pneumococcal vaccine status: Up to date  Covid-19 vaccine  status: Information provided on how to obtain vaccines.   Qualifies for Shingles Vaccine? Yes   Zostavax completed Yes   Shingrix Completed?: Yes  Screening Tests Health Maintenance  Topic Date Due   Fecal DNA (Cologuard)  Never done   MAMMOGRAM  07/02/2013   DEXA SCAN  Never done   COVID-19 Vaccine (3 - 2023-24 season) 10/31/2021   Medicare Annual Wellness (AWV)  02/14/2023   DTaP/Tdap/Td (2 - Td or Tdap) 06/02/2027   Pneumonia Vaccine 88+ Years old  Completed   INFLUENZA VACCINE  Completed   Hepatitis C Screening  Completed   Zoster Vaccines- Shingrix   Completed   HPV VACCINES  Aged Out   COLON CANCER SCREENING ANNUAL FOBT  Discontinued    Health Maintenance  Health Maintenance Due  Topic Date Due   Fecal DNA (Cologuard)  Never done   MAMMOGRAM  07/02/2013   DEXA SCAN  Never done   COVID-19 Vaccine (3 - 2023-24 season) 10/31/2021   Colorectal cancer screening: Type of screening: Cologuard. Patient stated to having kit and will process it.   Mammogram status: Ordered 11/10/21. Pt provided with contact info and advised to call to schedule appt.   Bone Density status: Ordered 11/10/21. Pt provided with contact info and advised to call to schedule appt.  Lung Cancer Screening: (Low Dose CT Chest recommended if Age 62-80 years, 30 pack-year currently smoking OR have quit w/in 15years.) does not qualify.   Lung Cancer Screening Referral: n/a   Additional Screening:  Hepatitis C Screening: does qualify; Completed 01/10/21  Vision Screening: Recommended annual ophthalmology exams for early detection of glaucoma and other disorders of the eye. Is the patient up to date with their annual eye exam?  No  Who is the provider or what is the name of the office in which the patient attends annual eye exams?  If pt is not established with a provider, would they like to be referred to a provider to establish care?  Patient stated she will schedule it  .   Dental Screening: Recommended annual dental exams for proper oral hygiene  Community Resource Referral / Chronic Care Management: CRR required this visit?  No   CCM required this visit?  No      Plan:     I have personally reviewed and noted the following in the patient's chart:   Medical and social history Use of alcohol, tobacco or illicit drugs  Current medications and supplements including opioid prescriptions. Patient is not currently taking opioid prescriptions. Functional ability and status Nutritional status Physical activity Advanced directives List of other  physicians Hospitalizations, surgeries, and ER visits in previous 12 months Vitals Screenings to include cognitive, depression, and falls Referrals and appointments  In addition, I have reviewed and discussed with patient certain preventive protocols, quality metrics, and best practice recommendations. A written personalized care plan for preventive services as well as general preventive health recommendations were provided to patient.     Lillie Columbia, [place]   02/13/2022   Nurse Notes:

## 2022-03-05 ENCOUNTER — Other Ambulatory Visit: Payer: Self-pay | Admitting: Family Medicine

## 2022-03-05 DIAGNOSIS — I10 Essential (primary) hypertension: Secondary | ICD-10-CM

## 2022-03-06 NOTE — Telephone Encounter (Signed)
Requested Prescriptions  Pending Prescriptions Disp Refills   isosorbide mononitrate (IMDUR) 30 MG 24 hr tablet [Pharmacy Med Name: Isosorbide Mononitrate ER 30 MG Oral Tablet Extended Release 24 Hour] 90 tablet 3    Sig: TAKE 1 TABLET BY MOUTH DAILY     Cardiovascular:  Nitrates Failed - 03/05/2022 10:06 PM      Failed - Last BP in normal range    BP Readings from Last 1 Encounters:  01/07/22 (!) 157/75         Passed - Last Heart Rate in normal range    Pulse Readings from Last 1 Encounters:  01/07/22 60         Passed - Valid encounter within last 12 months    Recent Outpatient Visits           1 month ago Screening for colon cancer   [place], Charlane Ferretti, MD   3 months ago Chronic pain of right knee   [place], Charlane Ferretti, MD   8 months ago Screening for colon cancer   [place], [place], MD   1 year ago Need for hepatitis C screening test   [place], [place]ob[place], MD   1 year ago Screening for colon cancer   [place], MD       Future Appointments             In 4 months Charlott Rakes, MD [place]

## 2022-04-10 ENCOUNTER — Other Ambulatory Visit: Payer: Self-pay | Admitting: Family Medicine

## 2022-04-10 DIAGNOSIS — M19041 Primary osteoarthritis, right hand: Secondary | ICD-10-CM

## 2022-04-13 NOTE — Telephone Encounter (Signed)
Unable to refill per protocol, Rx request is too soon. Last refill 01/27/22 for 90 and 1 refill.  Requested Prescriptions  Pending Prescriptions Disp Refills   meloxicam (MOBIC) 7.5 MG tablet [Pharmacy Med Name: Meloxicam 7.5 MG Oral Tablet] 100 tablet 2    Sig: TAKE 1 TABLET BY MOUTH DAILY     Analgesics:  COX2 Inhibitors Failed - 04/10/2022 10:33 PM      Failed - Manual Review: Labs are only required if the patient has taken medication for more than 8 weeks.      Failed - Cr in normal range and within 360 days    Creat  Date Value Ref Range Status  10/21/2015 0.90 0.50 - 0.99 mg/dL Final    Comment:      For patients > or = 71 years of age: The upper reference limit for Creatinine is approximately 13% higher for people identified as African-American.      Creatinine, Ser  Date Value Ref Range Status  01/07/2022 1.33 (H) 0.57 - 1.00 mg/dL Final         Failed - AST in normal range and within 360 days    AST  Date Value Ref Range Status  01/07/2022 60 (H) 0 - 40 IU/L Final  08/12/2011 33 U/L Final         Failed - ALT in normal range and within 360 days    ALT  Date Value Ref Range Status  01/07/2022 42 (H) 0 - 32 IU/L Final         Passed - HGB in normal range and within 360 days    Hemoglobin  Date Value Ref Range Status  01/07/2022 11.5 11.1 - 15.9 g/dL Final         Passed - HCT in normal range and within 360 days    HCT  Date Value Ref Range Status  08/12/2011 38 % Final   Hematocrit  Date Value Ref Range Status  01/07/2022 34.1 34.0 - 46.6 % Final         Passed - eGFR is 30 or above and within 360 days    GFR, Est African American  Date Value Ref Range Status  10/21/2015 78 >=60 mL/min Final   GFR calc Af Amer  Date Value Ref Range Status  01/09/2020 69 >59 mL/min/1.73 Final    Comment:    **In accordance with recommendations from the NKF-ASN Task force,**   [facility] is in the process of updating its eGFR calculation to the   2021 CKD-EPI  creatinine equation that estimates kidney function   without a race variable.    GFR, Est Non African American  Date Value Ref Range Status  10/21/2015 68 >=60 mL/min Final   GFR calc non Af Amer  Date Value Ref Range Status  01/09/2020 60 >59 mL/min/1.73 Final   eGFR  Date Value Ref Range Status  01/07/2022 43 (L) >59 mL/min/1.73 Final         Passed - Patient is not pregnant      Passed - Valid encounter within last 12 months    Recent Outpatient Visits           3 months ago Screening for colon cancer   [place], Charlane Ferretti, MD   5 months ago Chronic pain of right knee   [place], MD   9 months ago Screening for colon cancer   [facility]  [place] Charlott Rakes, MD   1 year ago Need for hepatitis C screening test   [place], MD   1 year ago Screening for colon cancer   [place], MD       Future Appointments             In 2 months Charlott Rakes, MD [place]

## 2022-06-01 ENCOUNTER — Other Ambulatory Visit: Payer: Self-pay | Admitting: Family Medicine

## 2022-06-01 DIAGNOSIS — M19042 Primary osteoarthritis, left hand: Secondary | ICD-10-CM

## 2022-06-02 NOTE — Telephone Encounter (Signed)
Rx 01/07/22 #90 1RF- too soon Requested Prescriptions  Pending Prescriptions Disp Refills   meloxicam (MOBIC) 7.5 MG tablet [Pharmacy Med Name: Meloxicam 7.5 MG Oral Tablet] 80 tablet 3    Sig: TAKE 1 TABLET BY MOUTH DAILY     Analgesics:  COX2 Inhibitors Failed - 06/01/2022 10:17 PM      Failed - Manual Review: Labs are only required if the patient has taken medication for more than 8 weeks.      Failed - Cr in normal range and within 360 days    Creat  Date Value Ref Range Status  10/21/2015 0.90 0.50 - 0.99 mg/dL Final    Comment:      For patients > or = 71 years of age: The upper reference limit for Creatinine is approximately 13% higher for people identified as African-American.      Creatinine, Ser  Date Value Ref Range Status  01/07/2022 1.33 (H) 0.57 - 1.00 mg/dL Final         Failed - AST in normal range and within 360 days    AST  Date Value Ref Range Status  01/07/2022 60 (H) 0 - 40 IU/L Final  08/12/2011 33 U/L Final         Failed - ALT in normal range and within 360 days    ALT  Date Value Ref Range Status  01/07/2022 42 (H) 0 - 32 IU/L Final         Passed - HGB in normal range and within 360 days    Hemoglobin  Date Value Ref Range Status  01/07/2022 11.5 11.1 - 15.9 g/dL Final         Passed - HCT in normal range and within 360 days    HCT  Date Value Ref Range Status  08/12/2011 38 % Final   Hematocrit  Date Value Ref Range Status  01/07/2022 34.1 34.0 - 46.6 % Final         Passed - eGFR is 30 or above and within 360 days    GFR, Est African American  Date Value Ref Range Status  10/21/2015 78 >=60 mL/min Final   GFR calc Af Amer  Date Value Ref Range Status  01/09/2020 69 >59 mL/min/1.73 Final    Comment:    **In accordance with recommendations from the NKF-ASN Task force,**   [facility] is in the process of updating its eGFR calculation to the   2021 CKD-EPI creatinine equation that estimates kidney function   without a race  variable.    GFR, Est Non African American  Date Value Ref Range Status  10/21/2015 68 >=60 mL/min Final   GFR calc non Af Amer  Date Value Ref Range Status  01/09/2020 60 >59 mL/min/1.73 Final   eGFR  Date Value Ref Range Status  01/07/2022 43 (L) >59 mL/min/1.73 Final         Passed - Patient is not pregnant      Passed - Valid encounter within last 12 months    Recent Outpatient Visits           4 months ago Screening for colon cancer   [place], Charlane Ferretti, MD   6 months ago Chronic pain of right knee   [place] Charlott Rakes, MD   11 months ago Screening for colon cancer   [place] Charlott Rakes, MD   1 year ago Need  for hepatitis C screening test   [place], MD   1 year ago Screening for colon cancer   [place], MD       Future Appointments             In 1 month Charlott Rakes, MD [place]

## 2022-06-14 ENCOUNTER — Other Ambulatory Visit: Payer: Self-pay | Admitting: Family Medicine

## 2022-06-14 DIAGNOSIS — M19041 Primary osteoarthritis, right hand: Secondary | ICD-10-CM

## 2022-06-15 ENCOUNTER — Telehealth: Payer: Self-pay

## 2022-06-15 NOTE — Progress Notes (Signed)
Patient attempted to be outreached by Brianna Yarborough, PharmD Candidate on 4/12 to discuss hypertension. Left voicemail for patient to return our call at their convenience at [phone].   Brianna Yarborough, PharmD Candidate   Nadja Lina, Pharm.D. PGY-2 Ambulatory Care Pharmacy Resident 

## 2022-06-16 NOTE — Telephone Encounter (Signed)
Requested medications are due for refill today.  yes  Requested medications are on the active medications list.  yes  Last refill. 01/07/2022 #90 1 rf  Future visit scheduled.   yes  Notes to clinic.  Please review for refill. Note from labs 12/2021:  Hoy Register, MD 01/08/2022  1:50 PM EST     Please inform her that cholesterol is Within normal limit, liver enzymes are minimally elevated but stable compared to the last couple of years. Kidney function is slightly abnormal and I would like her not to use Meloxicam frequently for pain but to alternate with tylenol as prolonged use can affect kidney function. Thanks.     Requested Prescriptions  Pending Prescriptions Disp Refills   meloxicam (MOBIC) 7.5 MG tablet [Pharmacy Med Name: Meloxicam 7.5 MG Oral Tablet] 80 tablet 3    Sig: TAKE 1 TABLET BY MOUTH DAILY     Analgesics:  COX2 Inhibitors Failed - 06/14/2022 10:45 PM      Failed - Manual Review: Labs are only required if the patient has taken medication for more than 8 weeks.      Failed - Cr in normal range and within 360 days    Creat  Date Value Ref Range Status  10/21/2015 0.90 0.50 - 0.99 mg/dL Final    Comment:      For patients > or = 71 years of age: The upper reference limit for Creatinine is approximately 13% higher for people identified as African-American.      Creatinine, Ser  Date Value Ref Range Status  01/07/2022 1.33 (H) 0.57 - 1.00 mg/dL Final         Failed - AST in normal range and within 360 days    AST  Date Value Ref Range Status  01/07/2022 60 (H) 0 - 40 IU/L Final  08/12/2011 33 U/L Final         Failed - ALT in normal range and within 360 days    ALT  Date Value Ref Range Status  01/07/2022 42 (H) 0 - 32 IU/L Final         Passed - HGB in normal range and within 360 days    Hemoglobin  Date Value Ref Range Status  01/07/2022 11.5 11.1 - 15.9 g/dL Final         Passed - HCT in normal range and within 360 days    HCT  Date  Value Ref Range Status  08/12/2011 38 % Final   Hematocrit  Date Value Ref Range Status  01/07/2022 34.1 34.0 - 46.6 % Final         Passed - eGFR is 30 or above and within 360 days    GFR, Est African American  Date Value Ref Range Status  10/21/2015 78 >=60 mL/min Final   GFR calc Af Amer  Date Value Ref Range Status  01/09/2020 69 >59 mL/min/1.73 Final    Comment:    **In accordance with recommendations from the NKF-ASN Task force,**   [facility] is in the process of updating its eGFR calculation to the   2021 CKD-EPI creatinine equation that estimates kidney function   without a race variable.    GFR, Est Non African American  Date Value Ref Range Status  10/21/2015 68 >=60 mL/min Final   GFR calc non Af Amer  Date Value Ref Range Status  01/09/2020 60 >59 mL/min/1.73 Final   eGFR  Date Value Ref Range Status  01/07/2022 43 (L) >59 mL/min/1.73 Final  Passed - Patient is not pregnant      Passed - Valid encounter within last 12 months    Recent Outpatient Visits           5 months ago Screening for colon cancer   [facility] [place], Odette Horns, MD   7 months ago Chronic pain of right knee   [facility] Hoy Register, MD   11 months ago Screening for colon cancer   [facility] Hoy Register, MD   1 year ago Need for hepatitis C screening test   [facility] Hoy Register, MD   1 year ago Screening for colon cancer   [facility] Hoy Register, MD       Future Appointments             In 3 weeks Hoy Register, MD [facility]

## 2022-07-08 ENCOUNTER — Ambulatory Visit: Payer: 59 | Admitting: Family Medicine

## 2022-08-10 ENCOUNTER — Other Ambulatory Visit: Payer: Self-pay | Admitting: Family Medicine

## 2022-08-10 DIAGNOSIS — M19041 Primary osteoarthritis, right hand: Secondary | ICD-10-CM

## 2022-09-15 ENCOUNTER — Ambulatory Visit: Payer: 59 | Attending: Family Medicine | Admitting: Family Medicine

## 2022-09-15 ENCOUNTER — Encounter: Payer: Self-pay | Admitting: Family Medicine

## 2022-09-15 ENCOUNTER — Other Ambulatory Visit: Payer: Self-pay

## 2022-09-15 VITALS — BP 126/68 | HR 69 | Ht 63.0 in | Wt 149.6 lb

## 2022-09-15 DIAGNOSIS — E7800 Pure hypercholesterolemia, unspecified: Secondary | ICD-10-CM | POA: Diagnosis not present

## 2022-09-15 DIAGNOSIS — I10 Essential (primary) hypertension: Secondary | ICD-10-CM | POA: Diagnosis not present

## 2022-09-15 DIAGNOSIS — Z1211 Encounter for screening for malignant neoplasm of colon: Secondary | ICD-10-CM | POA: Diagnosis not present

## 2022-09-15 DIAGNOSIS — F419 Anxiety disorder, unspecified: Secondary | ICD-10-CM

## 2022-09-15 DIAGNOSIS — M19042 Primary osteoarthritis, left hand: Secondary | ICD-10-CM

## 2022-09-15 DIAGNOSIS — F32A Depression, unspecified: Secondary | ICD-10-CM

## 2022-09-15 DIAGNOSIS — M19041 Primary osteoarthritis, right hand: Secondary | ICD-10-CM

## 2022-09-15 MED ORDER — LISINOPRIL 40 MG PO TABS
40.0000 mg | ORAL_TABLET | Freq: Every day | ORAL | 3 refills | Status: DC
  Filled 2022-09-15: qty 100, 100d supply, fill #0

## 2022-09-15 MED ORDER — TRAZODONE HCL 50 MG PO TABS
ORAL_TABLET | Freq: Every day | ORAL | 3 refills | Status: DC
  Filled 2022-09-15: qty 90, 90d supply, fill #0

## 2022-09-15 MED ORDER — ESCITALOPRAM OXALATE 20 MG PO TABS
ORAL_TABLET | Freq: Every day | ORAL | 3 refills | Status: DC
  Filled 2022-09-15: qty 90, 90d supply, fill #0

## 2022-09-15 MED ORDER — AMLODIPINE BESYLATE 10 MG PO TABS
10.0000 mg | ORAL_TABLET | Freq: Every day | ORAL | 3 refills | Status: DC
  Filled 2022-09-15: qty 100, 100d supply, fill #0

## 2022-09-15 MED ORDER — ISOSORBIDE MONONITRATE ER 30 MG PO TB24
30.0000 mg | ORAL_TABLET | Freq: Every day | ORAL | 3 refills | Status: DC
  Filled 2022-09-15: qty 90, 90d supply, fill #0

## 2022-09-15 MED ORDER — ATORVASTATIN CALCIUM 20 MG PO TABS
20.0000 mg | ORAL_TABLET | Freq: Every day | ORAL | 3 refills | Status: DC
  Filled 2022-09-15: qty 100, 100d supply, fill #0

## 2022-09-15 MED ORDER — HYDROXYZINE HCL 25 MG PO TABS
ORAL_TABLET | Freq: Three times a day (TID) | ORAL | 2 refills | Status: DC | PRN
  Filled 2022-09-15: qty 90, 30d supply, fill #0

## 2022-09-15 MED ORDER — MELOXICAM 7.5 MG PO TABS
7.5000 mg | ORAL_TABLET | Freq: Every day | ORAL | 0 refills | Status: DC
  Filled 2022-09-15: qty 100, 100d supply, fill #0

## 2022-09-15 MED ORDER — PROPRANOLOL HCL ER 80 MG PO CP24
80.0000 mg | ORAL_CAPSULE | Freq: Every day | ORAL | 2 refills | Status: DC
  Filled 2022-09-15: qty 100, 100d supply, fill #0

## 2022-09-15 NOTE — Patient Instructions (Signed)
Exercising to Stay Healthy To become healthy and stay healthy, it is recommended that you do moderate-intensity and vigorous-intensity exercise. You can tell that you are exercising at a moderate intensity if your heart starts beating faster and you start breathing faster but can still hold a conversation. You can tell that you are exercising at a vigorous intensity if you are breathing much harder and faster and cannot hold a conversation while exercising. How can exercise benefit me? Exercising regularly is important. It has many health benefits, such as: Improving overall fitness, flexibility, and endurance. Increasing bone density. Helping with weight control. Decreasing body fat. Increasing muscle strength and endurance. Reducing stress and tension, anxiety, depression, or anger. Improving overall health. What guidelines should I follow while exercising? Before you start a new exercise program, talk with your health care provider. Do not exercise so much that you hurt yourself, feel dizzy, or get very short of breath. Wear comfortable clothes and wear shoes with good support. Drink plenty of water while you exercise to prevent dehydration or heat stroke. Work out until your breathing and your heartbeat get faster (moderate intensity). How often should I exercise? Choose an activity that you enjoy, and set realistic goals. Your health care provider can help you make an activity plan that is individually designed and works best for you. Exercise regularly as told by your health care provider. This may include: Doing strength training two times a week, such as: Lifting weights. Using resistance bands. Push-ups. Sit-ups. Yoga. Doing a certain intensity of exercise for a given amount of time. Choose from these options: A total of 150 minutes of moderate-intensity exercise every week. A total of 75 minutes of vigorous-intensity exercise every week. A mix of moderate-intensity and  vigorous-intensity exercise every week. Children, pregnant women, people who have not exercised regularly, people who are overweight, and older adults may need to talk with a health care provider about what activities are safe to perform. If you have a medical condition, be sure to talk with your health care provider before you start a new exercise program. What are some exercise ideas? Moderate-intensity exercise ideas include: Walking 1 mile (1.6 km) in about 15 minutes. Biking. Hiking. Golfing. Dancing. Water aerobics. Vigorous-intensity exercise ideas include: Walking 4.5 miles (7.2 km) or more in about 1 hour. Jogging or running 5 miles (8 km) in about 1 hour. Biking 10 miles (16.1 km) or more in about 1 hour. Lap swimming. Roller-skating or in-line skating. Cross-country skiing. Vigorous competitive sports, such as football, basketball, and soccer. Jumping rope. Aerobic dancing. What are some everyday activities that can help me get exercise? Yard work, such as: Pushing a lawn mower. Raking and bagging leaves. Washing your car. Pushing a stroller. Shoveling snow. Gardening. Washing windows or floors. How can I be more active in my day-to-day activities? Use stairs instead of an elevator. Take a walk during your lunch break. If you drive, park your car farther away from your work or school. If you take public transportation, get off one stop early and walk the rest of the way. Stand up or walk around during all of your indoor phone calls. Get up, stretch, and walk around every 30 minutes throughout the day. Enjoy exercise with a friend. Support to continue exercising will help you keep a regular routine of activity. Where to find more information You can find more information about exercising to stay healthy from: U.S. Department of Health and Human Services: www.hhs.gov Centers for Disease Control and Prevention (  CDC): www.cdc.gov Summary Exercising regularly is  important. It will improve your overall fitness, flexibility, and endurance. Regular exercise will also improve your overall health. It can help you control your weight, reduce stress, and improve your bone density. Do not exercise so much that you hurt yourself, feel dizzy, or get very short of breath. Before you start a new exercise program, talk with your health care provider. This information is not intended to replace advice given to you by your health care provider. Make sure you discuss any questions you have with your health care provider. Document Revised: 06/14/2020 Document Reviewed: 06/14/2020 Elsevier Patient Education  2024 Elsevier Inc.  

## 2022-09-15 NOTE — Progress Notes (Signed)
Subjective:  Patient ID: [name], female    DOB: 10/30/1950  Age: 72 y.o. MRN: [id number]  CC: Hypertension   HPI [name] is a 72 y.o. year old female with a history of hypertension, anxiety and depression, history of breast cancer (status post left mastectomy over 16 years ago) osteoarthritis of the hand.   Interval History: Discussed the use of AI scribe software for clinical note transcription with the patient, who gave verbal consent to proceed.  History of Present Illness   She presents for a routine follow-up. She reports compliance with her antihypertensive medication, but her blood pressure was elevated at 147/67 today. She attributes this to dietary intake, specifically a sandwich with lunch meat, which she acknowledges is high in sodium.  The patient also reports ongoing right knee pain, which is somewhat alleviated by a knee brace. She declines further intervention for the knee pain, and is managing it with meloxicam.  In addition to hypertension and knee pain, the patient has a history of anxiety, which she reports is well-controlled on Lexapro, hydroxyzine as needed, and trazodone at bedtime.   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 She reports that this activity helps with her joint pain.        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   Hypercholesteremia    dx 2010   Hypertension    dx 1996   Leukopenia 07/10/2011   Stable compared to 2012    Past Surgical History:  Procedure Laterality Date   ABDOMINAL HYSTERECTOMY     late 1990s.   BREAST SURGERY Left    2004   MASTECTOMY  2005   left side    Family History  Problem Relation Age of Onset   Hypertension Mother     Arthritis Sister        RA    Social History   Socioeconomic History   Marital status: Divorced    Spouse name: Not on file   Number of children: Not on file   Years of education: Not on file   Highest education level: Not on file  Occupational History   Not on file  Tobacco Use   Smoking status: Former    Current packs/day: 0.00    Types: Cigarettes    Quit date: 03/20/1994    Years since quitting: 28.5   Smokeless tobacco: Never  Substance and Sexual Activity   Alcohol use: No   Drug use: No   Sexual activity: Yes    Birth control/protection: Condom  Other Topics Concern   Not on file  Social History Narrative   Recently separated from husband of 39 years.    Social Determinants of Health   Financial Resource Strain: Low Risk  (02/13/2022)   Overall Financial Resource Strain (CARDIA)    Difficulty of Paying Living Expenses: Not hard at all  Food Insecurity: No Food Insecurity (02/13/2022)   Hunger Vital Sign    Worried About Running Out of Food in the Last Year: Never true    Ran Out of Food in the Last Year: Never true  Transportation Needs: No Transportation Needs (02/13/2022)   PRAPARE - Administrator, Civil Service (  Medical): No    Lack of Transportation (Non-Medical): No  Physical Activity: Sufficiently Active (02/13/2022)   Exercise Vital Sign    Days of Exercise per Week: 5 days    Minutes of Exercise per Session: 30 min  Stress: Stress Concern Present (02/13/2022)   [employer] of Occupational Health - Occupational Stress Questionnaire    Feeling of Stress : To some extent  Social Connections: Unknown (02/13/2022)   Social Connection and Isolation Panel [NHANES]    Frequency of Communication with Friends and Family: Three times a week    Frequency of Social Gatherings with Friends and Family: Three times a week    Attends Religious Services: Not on file    Active Member of Clubs or Organizations: Yes    Attends Banker  Meetings: Not on file    Marital Status: Divorced    Allergies  Allergen Reactions   Fish Allergy Shortness Of Breath   Strawberry Extract Shortness Of Breath   Aspirin Nausea Only    Outpatient Medications Prior to Visit  Medication Sig Dispense Refill   acetaminophen (TYLENOL) 325 MG tablet Take 2 tablets (650 mg total) by mouth every 6 (six) hours as needed. 30 tablet 0   cetirizine (ZYRTEC) 10 MG tablet Take 1 tablet (10 mg total) by mouth daily. 30 tablet 6   Elastic Bandages & Supports (WRIST SPLINT/ELASTIC LEFT MED) MISC 1 each by Does not apply route daily. 1 each 0   Elastic Bandages & Supports (WRIST SPLINT/ELASTIC RIGHT MED) MISC 1 each by Does not apply route daily. 1 each 0   amLODipine (NORVASC) 10 MG tablet Take 1 tablet (10 mg total) by mouth daily. 100 tablet 3   atorvastatin (LIPITOR) 20 MG tablet Take 1 tablet (20 mg total) by mouth daily. 100 tablet 2   escitalopram (LEXAPRO) 20 MG tablet TAKE 1 TABLET (20 MG TOTAL) BY MOUTH DAILY. 90 tablet 3   hydrOXYzine (ATARAX) 25 MG tablet TAKE 1 TABLET (25 MG TOTAL) BY MOUTH 3 (THREE) TIMES DAILY AS NEEDED. 90 tablet 2   isosorbide mononitrate (IMDUR) 30 MG 24 hr tablet TAKE 1 TABLET BY MOUTH DAILY 90 tablet 3   lisinopril (ZESTRIL) 40 MG tablet Take 1 tablet (40 mg total) by mouth daily. 100 tablet 3   meloxicam (MOBIC) 7.5 MG tablet TAKE 1 TABLET BY MOUTH DAILY 100 tablet 0   predniSONE (DELTASONE) 20 MG tablet Take 1 tablet (20 mg total) by mouth daily with breakfast. 5 tablet 0   propranolol ER (INDERAL LA) 80 MG 24 hr capsule TAKE 1 CAPSULE BY MOUTH AT  BEDTIME 100 capsule 2   traZODone (DESYREL) 50 MG tablet TAKE 1 TABLET (50 MG TOTAL) BY MOUTH AT BEDTIME. 90 tablet 3   No facility-administered medications prior to visit.     ROS Review of Systems  Constitutional:  Negative for activity change and appetite change.  HENT:  Negative for sinus pressure and sore throat.   Respiratory:  Negative for chest tightness,  shortness of breath and wheezing.   Cardiovascular:  Negative for chest pain and palpitations.  Gastrointestinal:  Negative for abdominal distention, abdominal pain and constipation.  Genitourinary: Negative.   Musculoskeletal:        See HPI  Psychiatric/Behavioral:  Negative for behavioral problems and dysphoric mood.     Objective:  BP 126/68   Pulse 69   Ht 5\' 3"  (1.6 m)   Wt 149 lb 9.6 oz (67.9 kg)   SpO2 99%  BMI 26.50 kg/m      09/15/2022    4:42 PM 09/15/2022    3:57 PM 01/07/2022    4:22 PM  BP/Weight  Systolic BP 126 147 157  Diastolic BP 68 67 75  Wt. (Lbs)  149.6 157.4  BMI  26.5 kg/m2 27.02 kg/m2      Physical Exam Constitutional:      Appearance: She is well-developed.  Cardiovascular:     Rate and Rhythm: Normal rate.     Heart sounds: Normal heart sounds. No murmur heard. Pulmonary:     Effort: Pulmonary effort is normal.     Breath sounds: Normal breath sounds. No wheezing or rales.  Chest:     Chest wall: No tenderness.  Abdominal:     General: Bowel sounds are normal. There is no distension.     Palpations: Abdomen is soft. There is no mass.     Tenderness: There is no abdominal tenderness.  Musculoskeletal:        General: Normal range of motion.     Right lower leg: No edema.     Left lower leg: No edema.  Neurological:     Mental Status: She is alert and oriented to person, place, and time.  Psychiatric:        Mood and Affect: Mood normal.        Latest Ref Rng & Units 01/07/2022    4:42 PM 07/07/2021    4:33 PM 01/07/2021    9:15 AM  CMP  Glucose 70 - 99 mg/dL 161  70  096   BUN 8 - 27 mg/dL 19  22  13    Creatinine 0.57 - 1.00 mg/dL 0.45  4.09  8.11   Sodium 134 - 144 mmol/L 137  135  139   Potassium 3.5 - 5.2 mmol/L 4.3  4.2  4.5   Chloride 96 - 106 mmol/L 99  97  102   CO2 20 - 29 mmol/L 24  21  25    Calcium 8.7 - 10.3 mg/dL 91.4  78.2  95.6   Total Protein 6.0 - 8.5 g/dL 7.0   7.0   Total Bilirubin 0.0 - 1.2 mg/dL 0.6   0.4    Alkaline Phos 44 - 121 IU/L 70   78   AST 0 - 40 IU/L 60   43   ALT 0 - 32 IU/L 42   43     Lipid Panel     Component Value Date/Time   CHOL 155 01/07/2022 1642   TRIG 150 (H) 01/07/2022 1642   HDL 70 01/07/2022 1642   CHOLHDL 2.4 07/15/2020 0854   CHOLHDL 3.5 03/20/2014 1118   VLDL 20 03/20/2014 1118   LDLCALC 60 01/07/2022 1642    CBC    Component Value Date/Time   WBC 5.2 01/07/2022 1642   WBC 4.5 03/20/2014 1118   RBC 3.97 01/07/2022 1642   RBC 4.52 03/20/2014 1118   HGB 11.5 01/07/2022 1642   HCT 34.1 01/07/2022 1642   HCT 38 08/12/2011 1445   PLT 171 01/07/2022 1642   MCV 86 01/07/2022 1642   MCV 82.2 08/12/2011 1445   MCH 29.0 01/07/2022 1642   MCH 28.8 03/20/2014 1118   MCHC 33.7 01/07/2022 1642   MCHC 34.3 03/20/2014 1118   RDW 12.7 01/07/2022 1642   LYMPHSABS 2.8 01/07/2022 1642   MONOABS 0.3 07/10/2011 1327   EOSABS 0.2 01/07/2022 1642   BASOSABS 0.0 01/07/2022 1642    Lab Results  Component  Value Date   HGBA1C 5.40 04/19/2015    Assessment & Plan:      Knee Pain: Right knee pain managed with knee brace and Meloxicam. No desire for further intervention. -Continue Meloxicam as needed for pain. -Continue use of knee brace for support.  Anxiety: Controlled on current regimen of Lexapro, Hydroxyzine (as needed), and Trazodone. -Continue current medication regimen.  Hypertension: Elevated blood pressure possibly related to dietary sodium intake. - Repeat Blood pressure returned to normal -Continue current antihypertensive medication. -Advised to monitor sodium intake.  Colon Cancer Screening: Due for screening, last completed prior to age 78. -Order FIT kit to be sent to home for completion.  General Health Maintenance: -Order blood work to check kidney and liver function. -Schedule follow-up appointment in six months.           Meds ordered this encounter  Medications   atorvastatin (LIPITOR) 20 MG tablet    Sig: Take 1 tablet (20  mg total) by mouth daily.    Dispense:  100 tablet    Refill:  3    Please send a replace/new response with 100-Day Supply if appropriate to maximize member benefit. Requesting 1 year supply.   escitalopram (LEXAPRO) 20 MG tablet    Sig: TAKE 1 TABLET (20 MG TOTAL) BY MOUTH DAILY.    Dispense:  90 tablet    Refill:  3   hydrOXYzine (ATARAX) 25 MG tablet    Sig: TAKE 1 TABLET (25 MG TOTAL) BY MOUTH 3 (THREE) TIMES DAILY AS NEEDED.    Dispense:  90 tablet    Refill:  2   isosorbide mononitrate (IMDUR) 30 MG 24 hr tablet    Sig: Take 1 tablet (30 mg total) by mouth daily.    Dispense:  90 tablet    Refill:  3    Please send a replace/new response with 100-Day Supply if appropriate to maximize member benefit. Requesting 1 year supply.   lisinopril (ZESTRIL) 40 MG tablet    Sig: Take 1 tablet (40 mg total) by mouth daily.    Dispense:  100 tablet    Refill:  3    Please send a replace/new response with 100-Day Supply if appropriate to maximize member benefit. Requesting 1 year supply.   meloxicam (MOBIC) 7.5 MG tablet    Sig: Take 1 tablet (7.5 mg total) by mouth daily.    Dispense:  100 tablet    Refill:  0   propranolol ER (INDERAL LA) 80 MG 24 hr capsule    Sig: Take 1 capsule (80 mg total) by mouth at bedtime.    Dispense:  100 capsule    Refill:  2    Please send a replace/new response with 100-Day Supply if appropriate to maximize member benefit. Requesting 1 year supply.   traZODone (DESYREL) 50 MG tablet    Sig: TAKE 1 TABLET (50 MG TOTAL) BY MOUTH AT BEDTIME.    Dispense:  90 tablet    Refill:  3   amLODipine (NORVASC) 10 MG tablet    Sig: Take 1 tablet (10 mg total) by mouth daily.    Dispense:  100 tablet    Refill:  3    Please send a replace/new response with 100-Day Supply if appropriate to maximize member benefit. Requesting 1 year supply.    Follow-up: Return in about 6 months (around 03/18/2023) for Chronic medical conditions.       Hoy Register, MD,  FAAFP. [facility] [place],  [place] [phone]   09/15/2022, 4:53 PM

## 2022-09-16 ENCOUNTER — Other Ambulatory Visit: Payer: Self-pay | Admitting: Family Medicine

## 2022-09-16 DIAGNOSIS — E7800 Pure hypercholesterolemia, unspecified: Secondary | ICD-10-CM

## 2022-09-16 LAB — CMP14+EGFR
ALT: 24 IU/L (ref 0–32)
AST: 28 IU/L (ref 0–40)
Albumin: 4 g/dL (ref 3.8–4.8)
Alkaline Phosphatase: 71 IU/L (ref 44–121)
BUN/Creatinine Ratio: 11 — ABNORMAL LOW (ref 12–28)
BUN: 14 mg/dL (ref 8–27)
Bilirubin Total: <0.2 mg/dL (ref 0.0–1.2)
CO2: 22 mmol/L (ref 20–29)
Calcium: 10.2 mg/dL (ref 8.7–10.3)
Chloride: 101 mmol/L (ref 96–106)
Creatinine, Ser: 1.27 mg/dL — ABNORMAL HIGH (ref 0.57–1.00)
Globulin, Total: 2.5 g/dL (ref 1.5–4.5)
Glucose: 118 mg/dL — ABNORMAL HIGH (ref 70–99)
Potassium: 3.8 mmol/L (ref 3.5–5.2)
Sodium: 143 mmol/L (ref 134–144)
Total Protein: 6.5 g/dL (ref 6.0–8.5)
eGFR: 45 mL/min/{1.73_m2} — ABNORMAL LOW (ref >59)

## 2022-09-17 NOTE — Telephone Encounter (Signed)
No confirmation receipt from pharmacy, will resend.  Requested Prescriptions  Pending Prescriptions Disp Refills   atorvastatin (LIPITOR) 20 MG tablet [Pharmacy Med Name: Atorvastatin Calcium 20 MG Oral Tablet] 100 tablet 2    Sig: TAKE 1 TABLET BY MOUTH DAILY     Cardiovascular:  Antilipid - Statins Failed - 09/16/2022 10:32 PM      Failed - Lipid Panel in normal range within the last 12 months    Cholesterol, Total  Date Value Ref Range Status  01/07/2022 155 100 - 199 mg/dL Final   LDL Chol Calc (NIH)  Date Value Ref Range Status  01/07/2022 60 0 - 99 mg/dL Final   HDL  Date Value Ref Range Status  01/07/2022 70 >39 mg/dL Final   Triglycerides  Date Value Ref Range Status  01/07/2022 150 (H) 0 - 149 mg/dL Final         Passed - Patient is not pregnant      Passed - Valid encounter within last 12 months    Recent Outpatient Visits           2 days ago Screening for colon cancer   [facility] [place], Odette Horns, MD   8 months ago Screening for colon cancer   [facility] Hoy Register, MD   10 months ago Chronic pain of right knee   [facility] Hoy Register, MD   1 year ago Screening for colon cancer   [place] [facility] Hoy Register, MD   1 year ago Need for hepatitis C screening test   [facility] Hoy Register, MD       Future Appointments             In 6 months Hoy Register, MD [facility]

## 2022-09-22 ENCOUNTER — Other Ambulatory Visit: Payer: Self-pay

## 2022-09-23 ENCOUNTER — Other Ambulatory Visit: Payer: Self-pay | Admitting: Family Medicine

## 2022-09-23 DIAGNOSIS — I10 Essential (primary) hypertension: Secondary | ICD-10-CM

## 2022-09-23 DIAGNOSIS — F419 Anxiety disorder, unspecified: Secondary | ICD-10-CM

## 2022-09-24 NOTE — Telephone Encounter (Signed)
Unable to refill per protocol, Rx request was refilled 09/15/22 for 100 days and 2 refills. Requested Prescriptions  Pending Prescriptions Disp Refills   propranolol ER (INDERAL LA) 80 MG 24 hr capsule [Pharmacy Med Name: PROPRANOLOL  80MG   CAP  EXTENDED RELEASE] 100 capsule 2    Sig: TAKE 1 CAPSULE BY MOUTH AT  BEDTIME     Cardiovascular:  Beta Blockers Passed - 09/23/2022 10:45 PM      Passed - Last BP in normal range    BP Readings from Last 1 Encounters:  09/15/22 126/68         Passed - Last Heart Rate in normal range    Pulse Readings from Last 1 Encounters:  09/15/22 69         Passed - Valid encounter within last 6 months    Recent Outpatient Visits           1 week ago Screening for colon cancer   [facility] [place], Odette Horns, MD   8 months ago Screening for colon cancer   [facility] Hoy Register, MD   10 months ago Chronic pain of right knee   [facility] Hoy Register, MD   1 year ago Screening for colon cancer   [facility] Hoy Register, MD   1 year ago Need for hepatitis C screening test   [facility] Hoy Register, MD       Future Appointments             In 6 months Hoy Register, MD [facility]

## 2022-10-07 ENCOUNTER — Ambulatory Visit: Payer: 59 | Attending: Family Medicine

## 2022-10-07 VITALS — Ht 63.0 in | Wt 149.0 lb

## 2022-10-07 DIAGNOSIS — Z0000 Encounter for general adult medical examination without abnormal findings: Secondary | ICD-10-CM | POA: Diagnosis not present

## 2022-10-07 NOTE — Progress Notes (Signed)
Subjective:   [name] is a 72 y.o. female who presents for Medicare Annual (Subsequent) preventive examination.  Visit Complete: Virtual  I connected with  [name] on 10/07/22 by a audio enabled telemedicine application and verified that I am speaking with the correct person using two identifiers.  Patient Location: Home  Provider Location: Home Office  I discussed the limitations of evaluation and management by telemedicine. The patient expressed understanding and agreed to proceed.  Vital Signs: Unable to obtain new vitals due to this being a telehealth visit.   Review of Systems     Cardiac Risk Factors include: advanced age (>77men, >28 women);dyslipidemia;hypertension     Objective:    Today's Vitals   10/07/22 1812  Weight: 149 lb (67.6 kg)  Height: 5\' 3"  (1.6 m)   Body mass index is 26.39 kg/m.     10/07/2022    6:27 PM 02/13/2022    2:54 PM 04/18/2019   11:07 AM 10/11/2017   12:32 PM 12/17/2016    1:44 PM 10/21/2015    2:22 PM 09/09/2015    9:09 AM  Advanced Directives  Does Patient Have a Medical Advance Directive? No No No No No No No  Would patient like information on creating a medical advance directive? Yes (MAU/Ambulatory/Procedural Areas - Information given) No - Patient declined  No - Patient declined  No - patient declined information No - patient declined information    Current Medications (verified) Outpatient Encounter Medications as of 10/07/2022  Medication Sig   acetaminophen (TYLENOL) 325 MG tablet Take 2 tablets (650 mg total) by mouth every 6 (six) hours as needed.   amLODipine (NORVASC) 10 MG tablet Take 1 tablet (10 mg total) by mouth daily.   atorvastatin (LIPITOR) 20 MG tablet TAKE 1 TABLET BY MOUTH DAILY   cetirizine (ZYRTEC) 10 MG tablet Take 1 tablet (10 mg total) by mouth daily.   Elastic Bandages & Supports (WRIST SPLINT/ELASTIC LEFT MED) MISC 1 each by Does not apply route daily.   Elastic Bandages & Supports (WRIST  SPLINT/ELASTIC RIGHT MED) MISC 1 each by Does not apply route daily.   escitalopram (LEXAPRO) 20 MG tablet TAKE 1 TABLET (20 MG TOTAL) BY MOUTH DAILY.   hydrOXYzine (ATARAX) 25 MG tablet TAKE 1 TABLET (25 MG TOTAL) BY MOUTH 3 (THREE) TIMES DAILY AS NEEDED.   isosorbide mononitrate (IMDUR) 30 MG 24 hr tablet Take 1 tablet (30 mg total) by mouth daily.   lisinopril (ZESTRIL) 40 MG tablet Take 1 tablet (40 mg total) by mouth daily.   meloxicam (MOBIC) 7.5 MG tablet Take 1 tablet (7.5 mg total) by mouth daily.   propranolol ER (INDERAL LA) 80 MG 24 hr capsule Take 1 capsule (80 mg total) by mouth at bedtime.   traZODone (DESYREL) 50 MG tablet TAKE 1 TABLET (50 MG TOTAL) BY MOUTH AT BEDTIME.   No facility-administered encounter medications on file as of 10/07/2022.    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   Hypercholesteremia    dx 2010   Hypertension    dx 1996   Leukopenia 07/10/2011   Stable compared to 2012   Past Surgical History:  Procedure Laterality  Date   ABDOMINAL HYSTERECTOMY     late 1990s.   BREAST SURGERY Left    2004   MASTECTOMY  2005   left side   Family History  Problem Relation Age of Onset   Hypertension Mother    Arthritis Sister        RA   Social History   Socioeconomic History   Marital status: Divorced    Spouse name: Not on file   Number of children: Not on file   Years of education: Not on file   Highest education level: Not on file  Occupational History   Not on file  Tobacco Use   Smoking status: Former    Current packs/day: 0.00    Types: Cigarettes    Quit date: 03/20/1994    Years since quitting: 28.5   Smokeless tobacco: Never   Substance and Sexual Activity   Alcohol use: No   Drug use: No   Sexual activity: Yes    Birth control/protection: Condom  Other Topics Concern   Not on file  Social History Narrative   Recently separated from husband of 39 years.    Social Determinants of Health   Financial Resource Strain: Low Risk  (10/07/2022)   Overall Financial Resource Strain (CARDIA)    Difficulty of Paying Living Expenses: Not hard at all  Food Insecurity: No Food Insecurity (10/07/2022)   Hunger Vital Sign    Worried About Running Out of Food in the Last Year: Never true    Ran Out of Food in the Last Year: Never true  Transportation Needs: No Transportation Needs (10/07/2022)   PRAPARE - Administrator, Civil Service (Medical): No    Lack of Transportation (Non-Medical): No  Physical Activity: Sufficiently Active (10/07/2022)   Exercise Vital Sign    Days of Exercise per Week: 5 days    Minutes of Exercise per Session: 30 min  Stress: Stress Concern Present (02/13/2022)   [employer] of Occupational Health - Occupational Stress Questionnaire    Feeling of Stress : To some extent  Social Connections: Moderately Integrated (10/07/2022)   Social Connection and Isolation Panel [NHANES]    Frequency of Communication with Friends and Family: More than three times a week    Frequency of Social Gatherings with Friends and Family: Three times a week    Attends Religious Services: 1 to 4 times per year    Active Member of Clubs or Organizations: Yes    Attends Banker Meetings: 1 to 4 times per year    Marital Status: Divorced    Tobacco Counseling Counseling given: Not Answered   Clinical Intake:  Pre-visit preparation completed: Yes  Pain : No/denies pain     Diabetes: No  How often do you need to have someone help you when you read instructions, pamphlets, or other written materials from your doctor or pharmacy?: 1 - Never  Interpreter Needed?: No  Information  entered by :: Kandis Fantasia LPN   Activities of Daily Living    10/07/2022    6:26 PM 02/13/2022    5:37 PM  In your present state of health, do you have any difficulty performing the following activities:  Hearing? 0 0  Vision? 0 0  Difficulty concentrating or making decisions? 0 0  Walking or climbing stairs? 0 0  Dressing or bathing? 0 0  Doing errands, shopping? 0 0  Preparing Food and eating ? N N  Using the Toilet? N N  In the past six months, have you accidently leaked urine? N N  Do you have problems with loss of bowel control? N N  Managing your Medications? N   Managing your Finances? N   Housekeeping or managing your Housekeeping? N     Patient Care Team: Hoy Register, MD as PCP - General (Family Medicine) West Bali, MD (Inactive) (Gastroenterology)  Indicate any recent Medical Services you may have received from other than [facility] providers in the past year (date may be approximate).     Assessment:   This is a routine wellness examination for [facility].  Hearing/Vision screen Hearing Screening - Comments:: Denies hearing difficulties   Vision Screening - Comments:: No vision problems; will schedule routine eye exam soon    Dietary issues and exercise activities discussed:     Goals Addressed             This Visit's Progress    Remain active and independent         Depression Screen    10/07/2022    6:17 PM 09/15/2022    3:58 PM 11/10/2021    8:57 AM 07/08/2020    3:30 PM 01/09/2020    4:16 PM 07/05/2019    3:24 PM 04/18/2019   11:07 AM  PHQ 2/9 Scores  PHQ - 2 Score 0   3  2 0  PHQ- 9 Score    11  6   Exception Documentation  Patient refusal Patient refusal  Patient refusal      Fall Risk    10/07/2022    6:25 PM 09/15/2022    3:58 PM 02/13/2022    2:55 PM 01/07/2022    4:18 PM 11/10/2021    8:51 AM  Fall Risk   Falls in the past year? 0 0 1 0 0  Number falls in past yr: 0 0 1 0 0  Injury with Fall? 0 0 0 0 1  Risk for fall due to : No  Fall Risks No Fall Risks Other (Comment) No Fall Risks   Follow up Falls prevention discussed;Education provided;Falls evaluation completed        MEDICARE RISK AT HOME:  Medicare Risk at Home - 10/07/22 1825     Any stairs in or around the home? No    If so, are there any without handrails? No    Home free of loose throw rugs in walkways, pet beds, electrical cords, etc? Yes    Adequate lighting in your home to reduce risk of falls? Yes    Life alert? No    Use of a cane, walker or w/c? No    Grab bars in the bathroom? Yes    Shower chair or bench in shower? No    Elevated toilet seat or a handicapped toilet? No             TIMED UP AND GO:  Was the test performed?  No    Cognitive Function:    02/13/2022    2:56 PM  MMSE - Mini Mental State Exam  Orientation to time 5  Orientation to Place 5  Registration 3  Attention/ Calculation 5  Recall 3  Language- name 2 objects 2  Language- repeat 1  Language- follow 3 step command 3  Language- read & follow direction 1  Write a sentence 1  Copy design 1  Total score 30        10/07/2022    6:27 PM 02/13/2022    2:58  PM  6CIT Screen  What Year? 0 points 0 points  What month? 0 points 0 points  What time? 0 points   Count back from 20 0 points 0 points  Months in reverse 0 points 0 points  Repeat phrase 0 points 0 points  Total Score 0 points     Immunizations Immunization History  Administered Date(s) Administered   Influenza,inj,Quad PF,6+ Mos 11/27/2014, 10/21/2015, 12/21/2017, 11/09/2018, 01/09/2020, 11/10/2021   Influenza-Unspecified 10/31/2013   PFIZER(Purple Top)SARS-COV-2 Vaccination 05/04/2019, 05/30/2019   Pneumococcal Conjugate-13 06/01/2017   Pneumococcal Polysaccharide-23 04/18/2019   Tdap 06/01/2017   Zoster Recombinant(Shingrix) 04/18/2019, 07/17/2019    TDAP status: Up to date  Flu Vaccine status: Due, Education has been provided regarding the importance of this vaccine. Advised may  receive this vaccine at local pharmacy or Health Dept. Aware to provide a copy of the vaccination record if obtained from local pharmacy or Health Dept. Verbalized acceptance and understanding.  Pneumococcal vaccine status: Up to date  Covid-19 vaccine status: Information provided on how to obtain vaccines.   Qualifies for Shingles Vaccine? Yes   Zostavax completed No   Shingrix Completed?: Yes  Screening Tests Health Maintenance  Topic Date Due   Fecal DNA (Cologuard)  Never done   MAMMOGRAM  07/02/2013   DEXA SCAN  Never done   COVID-19 Vaccine (3 - 2023-24 season) 10/31/2021   INFLUENZA VACCINE  10/01/2022   Medicare Annual Wellness (AWV)  02/14/2023   DTaP/Tdap/Td (2 - Td or Tdap) 06/02/2027   Pneumonia Vaccine 32+ Years old  Completed   Hepatitis C Screening  Completed   Zoster Vaccines- Shingrix  Completed   HPV VACCINES  Aged Out   COLON CANCER SCREENING ANNUAL FOBT  Discontinued    Health Maintenance  Health Maintenance Due  Topic Date Due   Fecal DNA (Cologuard)  Never done   MAMMOGRAM  07/02/2013   DEXA SCAN  Never done   COVID-19 Vaccine (3 - 2023-24 season) 10/31/2021   INFLUENZA VACCINE  10/01/2022    Colorectal cancer screening:  Cologuard ordered and received patient still to complete   Mammogram status: Ordered  . Pt provided with contact info and advised to call to schedule appt.   Bone Density status: Ordered  . Pt provided with contact info and advised to call to schedule appt.  Lung Cancer Screening: (Low Dose CT Chest recommended if Age 29-80 years, 20 pack-year currently smoking OR have quit w/in 15years.) does not qualify.   Lung Cancer Screening Referral: n/a  Additional Screening:  Hepatitis C Screening: does qualify; Completed 01/07/21  Vision Screening: Recommended annual ophthalmology exams for early detection of glaucoma and other disorders of the eye. Is the patient up to date with their annual eye exam?  No  Who is the provider or  what is the name of the office in which the patient attends annual eye exams? none If pt is not established with a provider, would they like to be referred to a provider to establish care? No .   Dental Screening: Recommended annual dental exams for proper oral hygiene  Community Resource Referral / Chronic Care Management: CRR required this visit?  No   CCM required this visit?  No     Plan:     I have personally reviewed and noted the following in the patient's chart:   Medical and social history Use of alcohol, tobacco or illicit drugs  Current medications and supplements including opioid prescriptions. Patient is not currently taking opioid  prescriptions. Functional ability and status Nutritional status Physical activity Advanced directives List of other physicians Hospitalizations, surgeries, and ER visits in previous 12 months Vitals Screenings to include cognitive, depression, and falls Referrals and appointments  In addition, I have reviewed and discussed with patient certain preventive protocols, quality metrics, and best practice recommendations. A written personalized care plan for preventive services as well as general preventive health recommendations were provided to patient.     Kandis Fantasia [place], [place]   05/04/7423   After Visit Summary: (Mail) Due to this being a telephonic visit, the after visit summary with patients personalized plan was offered to patient via mail   Nurse Notes: No concerns at this time

## 2022-10-07 NOTE — Patient Instructions (Addendum)
[name] , Thank you for taking time to come for your Medicare Wellness Visit. I appreciate your ongoing commitment to your health goals. Please review the following plan we discussed and let me know if I can assist you in the future.   Referrals/Orders/Follow-Ups/Clinician Recommendations: Aim for 30 minutes of exercise or brisk walking, 6-8 glasses of water, and 5 servings of fruits and vegetables each day.  This is a list of the screening recommended for you and due dates:  Health Maintenance  Topic Date Due   Cologuard (Stool DNA test)  Never done   Mammogram  07/02/2013   DEXA scan (bone density measurement)  Never done   COVID-19 Vaccine (3 - 2023-24 season) 10/31/2021   Flu Shot  10/01/2022   Medicare Annual Wellness Visit  02/14/2023   DTaP/Tdap/Td vaccine (2 - Td or Tdap) 06/02/2027   Pneumonia Vaccine  Completed   Hepatitis C Screening  Completed   Zoster (Shingles) Vaccine  Completed   HPV Vaccine  Aged Out   Stool Blood Test  Discontinued    Advanced directives: (ACP Link)Information on Advanced Care Planning can be found at [facility] Advance Health Care Directives Advance Health Care Directives ([email or web address]   Next Medicare Annual Wellness Visit scheduled for next year: Yes  Preventive Care 65 Years and Older, Female Preventive care refers to lifestyle choices and visits with your health care provider that can promote health and wellness. What does preventive care include? A yearly physical exam. This is also called an annual well check. Dental exams once or twice a year. Routine eye exams. Ask your health care provider how often you should have your eyes checked. Personal lifestyle choices, including: Daily care of your teeth and gums. Regular physical activity. Eating a healthy diet. Avoiding tobacco and drug use. Limiting alcohol use. Practicing safe sex. Taking low-dose aspirin every day. Taking vitamin and mineral supplements as  recommended by your health care provider. What happens during an annual well check? The services and screenings done by your health care provider during your annual well check will depend on your age, overall health, lifestyle risk factors, and family history of disease. Counseling  Your health care provider may ask you questions about your: Alcohol use. Tobacco use. Drug use. Emotional well-being. Home and relationship well-being. Sexual activity. Eating habits. History of falls. Memory and ability to understand (cognition). Work and work Astronomer. Reproductive health. Screening  You may have the following tests or measurements: Height, weight, and BMI. Blood pressure. Lipid and cholesterol levels. These may be checked every 5 years, or more frequently if you are over 21 years old. Skin check. Lung cancer screening. You may have this screening every year starting at age 35 if you have a 30-pack-year history of smoking and currently smoke or have quit within the past 15 years. Fecal occult blood test (FOBT) of the stool. You may have this test every year starting at age 18. Flexible sigmoidoscopy or colonoscopy. You may have a sigmoidoscopy every 5 years or a colonoscopy every 10 years starting at age 20. Hepatitis C blood test. Hepatitis B blood test. Sexually transmitted disease (STD) testing. Diabetes screening. This is done by checking your blood sugar (glucose) after you have not eaten for a while (fasting). You may have this done every 1-3 years. Bone density scan. This is done to screen for osteoporosis. You may have this done starting at age 8. Mammogram. This may be done every 1-2 years. Talk to your  health care provider about how often you should have regular mammograms. Talk with your health care provider about your test results, treatment options, and if necessary, the need for more tests. Vaccines  Your health care provider may recommend certain vaccines, such  as: Influenza vaccine. This is recommended every year. Tetanus, diphtheria, and acellular pertussis (Tdap, Td) vaccine. You may need a Td booster every 10 years. Zoster vaccine. You may need this after age 43. Pneumococcal 13-valent conjugate (PCV13) vaccine. One dose is recommended after age 63. Pneumococcal polysaccharide (PPSV23) vaccine. One dose is recommended after age 48. Talk to your health care provider about which screenings and vaccines you need and how often you need them. This information is not intended to replace advice given to you by your health care provider. Make sure you discuss any questions you have with your health care provider. Document Released: 03/15/2015 Document Revised: 11/06/2015 Document Reviewed: 12/18/2014 Elsevier Interactive Patient Education  2017 [employer].  Fall Prevention in the Home Falls can cause injuries. They can happen to people of all ages. There are many things you can do to make your home safe and to help prevent falls. What can I do on the outside of my home? Regularly fix the edges of walkways and driveways and fix any cracks. Remove anything that might make you trip as you walk through a door, such as a raised step or threshold. Trim any bushes or trees on the path to your home. Use bright outdoor lighting. Clear any walking paths of anything that might make someone trip, such as rocks or tools. Regularly check to see if handrails are loose or broken. Make sure that both sides of any steps have handrails. Any raised decks and porches should have guardrails on the edges. Have any leaves, snow, or ice cleared regularly. Use sand or salt on walking paths during winter. Clean up any spills in your garage right away. This includes oil or grease spills. What can I do in the bathroom? Use night lights. Install grab bars by the toilet and in the tub and shower. Do not use towel bars as grab bars. Use non-skid mats or decals in the tub or  shower. If you need to sit down in the shower, use a plastic, non-slip stool. Keep the floor dry. Clean up any water that spills on the floor as soon as it happens. Remove soap buildup in the tub or shower regularly. Attach bath mats securely with double-sided non-slip rug tape. Do not have throw rugs and other things on the floor that can make you trip. What can I do in the bedroom? Use night lights. Make sure that you have a light by your bed that is easy to reach. Do not use any sheets or blankets that are too big for your bed. They should not hang down onto the floor. Have a firm chair that has side arms. You can use this for support while you get dressed. Do not have throw rugs and other things on the floor that can make you trip. What can I do in the kitchen? Clean up any spills right away. Avoid walking on wet floors. Keep items that you use a lot in easy-to-reach places. If you need to reach something above you, use a strong step stool that has a grab bar. Keep electrical cords out of the way. Do not use floor polish or wax that makes floors slippery. If you must use wax, use non-skid floor wax. Do not have  throw rugs and other things on the floor that can make you trip. What can I do with my stairs? Do not leave any items on the stairs. Make sure that there are handrails on both sides of the stairs and use them. Fix handrails that are broken or loose. Make sure that handrails are as long as the stairways. Check any carpeting to make sure that it is firmly attached to the stairs. Fix any carpet that is loose or worn. Avoid having throw rugs at the top or bottom of the stairs. If you do have throw rugs, attach them to the floor with carpet tape. Make sure that you have a light switch at the top of the stairs and the bottom of the stairs. If you do not have them, ask someone to add them for you. What else can I do to help prevent falls? Wear shoes that: Do not have high heels. Have  rubber bottoms. Are comfortable and fit you well. Are closed at the toe. Do not wear sandals. If you use a stepladder: Make sure that it is fully opened. Do not climb a closed stepladder. Make sure that both sides of the stepladder are locked into place. Ask someone to hold it for you, if possible. Clearly mark and make sure that you can see: Any grab bars or handrails. First and last steps. Where the edge of each step is. Use tools that help you move around (mobility aids) if they are needed. These include: Canes. Walkers. Scooters. Crutches. Turn on the lights when you go into a dark area. Replace any light bulbs as soon as they burn out. Set up your furniture so you have a clear path. Avoid moving your furniture around. If any of your floors are uneven, fix them. If there are any pets around you, be aware of where they are. Review your medicines with your doctor. Some medicines can make you feel dizzy. This can increase your chance of falling. Ask your doctor what other things that you can do to help prevent falls. This information is not intended to replace advice given to you by your health care provider. Make sure you discuss any questions you have with your health care provider. Document Released: 12/13/2008 Document Revised: 07/25/2015 Document Reviewed: 03/23/2014 Elsevier Interactive Patient Education  2017 [employer].

## 2022-10-22 ENCOUNTER — Other Ambulatory Visit: Payer: Self-pay | Admitting: Family Medicine

## 2022-10-22 DIAGNOSIS — M19041 Primary osteoarthritis, right hand: Secondary | ICD-10-CM

## 2022-10-23 NOTE — Telephone Encounter (Signed)
Request is too soon, last refill 09/15/22 for 100 days, duplicate request.  Requested Prescriptions  Pending Prescriptions Disp Refills   meloxicam (MOBIC) 7.5 MG tablet [Pharmacy Med Name: Meloxicam 7.5 MG Oral Tablet] 100 tablet 2    Sig: TAKE 1 TABLET BY MOUTH DAILY     Analgesics:  COX2 Inhibitors Failed - 10/22/2022 10:04 PM      Failed - Manual Review: Labs are only required if the patient has taken medication for more than 8 weeks.      Failed - Cr in normal range and within 360 days    Creat  Date Value Ref Range Status  10/21/2015 0.90 0.50 - 0.99 mg/dL Final    Comment:      For patients > or = 71 years of age: The upper reference limit for Creatinine is approximately 13% higher for people identified as African-American.      Creatinine, Ser  Date Value Ref Range Status  09/15/2022 1.27 (H) 0.57 - 1.00 mg/dL Final         Passed - HGB in normal range and within 360 days    Hemoglobin  Date Value Ref Range Status  01/07/2022 11.5 11.1 - 15.9 g/dL Final         Passed - HCT in normal range and within 360 days    HCT  Date Value Ref Range Status  08/12/2011 38 % Final   Hematocrit  Date Value Ref Range Status  01/07/2022 34.1 34.0 - 46.6 % Final         Passed - AST in normal range and within 360 days    AST  Date Value Ref Range Status  09/15/2022 28 0 - 40 IU/L Final  08/12/2011 33 U/L Final         Passed - ALT in normal range and within 360 days    ALT  Date Value Ref Range Status  09/15/2022 24 0 - 32 IU/L Final         Passed - eGFR is 30 or above and within 360 days    GFR, Est African American  Date Value Ref Range Status  10/21/2015 78 >=60 mL/min Final   GFR calc Af Amer  Date Value Ref Range Status  01/09/2020 69 >59 mL/min/1.73 Final    Comment:    **In accordance with recommendations from the NKF-ASN Task force,**   [facility] is in the process of updating its eGFR calculation to the   2021 CKD-EPI creatinine equation that estimates  kidney function   without a race variable.    GFR, Est Non African American  Date Value Ref Range Status  10/21/2015 68 >=60 mL/min Final   GFR calc non Af Amer  Date Value Ref Range Status  01/09/2020 60 >59 mL/min/1.73 Final   eGFR  Date Value Ref Range Status  09/15/2022 45 (L) >59 mL/min/1.73 Final         Passed - Patient is not pregnant      Passed - Valid encounter within last 12 months    Recent Outpatient Visits           1 month ago Screening for colon cancer   [facility] [place], Odette Horns, MD   9 months ago Screening for colon cancer   [facility] Hoy Register, MD   11 months ago Chronic pain of right knee   [facility] Hoy Register, MD  1 year ago Screening for colon cancer   [place] [facility] Hoy Register, MD   1 year ago Need for hepatitis C screening test   [facility] Hoy Register, MD       Future Appointments             In 5 months Hoy Register, MD [facility]

## 2022-11-30 ENCOUNTER — Other Ambulatory Visit: Payer: Self-pay | Admitting: Family Medicine

## 2022-11-30 DIAGNOSIS — F419 Anxiety disorder, unspecified: Secondary | ICD-10-CM

## 2022-12-01 ENCOUNTER — Telehealth: Payer: 59 | Admitting: Family Medicine

## 2022-12-01 NOTE — Telephone Encounter (Signed)
Routing for review

## 2022-12-01 NOTE — Telephone Encounter (Signed)
Call to  [facility] . Spoke with joyce. Advised that  [facility] never text patient's and the patient at this time does not have any medication that is in need of provider approval.   Call placed to patient  and she is aware.

## 2022-12-01 NOTE — Telephone Encounter (Signed)
Requested Prescriptions  Refused Prescriptions Disp Refills   escitalopram (LEXAPRO) 20 MG tablet [Pharmacy Med Name: Escitalopram Oxalate 20 MG Oral Tablet] 90 tablet 3    Sig: TAKE 1 TABLET BY MOUTH DAILY     Psychiatry:  Antidepressants - SSRI Passed - 11/30/2022 10:30 PM      Passed - Completed PHQ-2 or PHQ-9 in the last 360 days      Passed - Valid encounter within last 6 months    Recent Outpatient Visits           2 months ago Screening for colon cancer   [facility] H[facility]   10 months ago Screening for colon cancer   [facility] Hoy Register, MD   1 year ago Chronic pain of right knee   [facility], [facility]   1 year ago Screening for colon cancer   [facility] Hoy Register, MD   1 year ago Need for hepatitis C screening test   [facility] Hoy Register, MD       Future Appointments             In 3 months Hoy Register, MD [facility]

## 2022-12-01 NOTE — Telephone Encounter (Addendum)
Medication Refill - Medication:   Patient does not know which medication, patient states she received a text from [facility] that stated they would not refill medication without provider's approval and patient stated the text message also said " lex1" "st2"  Has the patient contacted their pharmacy? No.  Preferred Pharmacy (with phone number or street name):  [facility] - [place], [place] - [address]  Phone: [phone] Fax: [phone]  Has the patient been seen for an appointment in the last year OR does the patient have an upcoming appointment? Yes, F/U on 03/23/2023

## 2022-12-14 ENCOUNTER — Other Ambulatory Visit: Payer: Self-pay | Admitting: Family Medicine

## 2022-12-14 DIAGNOSIS — F419 Anxiety disorder, unspecified: Secondary | ICD-10-CM

## 2022-12-14 NOTE — Telephone Encounter (Signed)
Requested Prescriptions  Refused Prescriptions Disp Refills   escitalopram (LEXAPRO) 20 MG tablet [Pharmacy Med Name: Escitalopram Oxalate 20 MG Oral Tablet] 90 tablet 3    Sig: TAKE 1 TABLET BY MOUTH DAILY     Psychiatry:  Antidepressants - SSRI Passed - 12/14/2022 12:51 AM      Passed - Completed PHQ-2 or PHQ-9 in the last 360 days      Passed - Valid encounter within last 6 months    Recent Outpatient Visits           3 months ago Screening for colon cancer   [facility]   11 months ago Screening for colon cancer   [facility] Hoy Register, MD   1 year ago Chronic pain of right knee   [facility] Hoy Register, MD   1 year ago Screening for colon cancer   [facility] Hoy Register, MD   1 year ago Need for hepatitis C screening test   [facility] Hoy Register, MD       Future Appointments             In 3 months Hoy Register, MD [facility]

## 2022-12-27 ENCOUNTER — Other Ambulatory Visit: Payer: Self-pay | Admitting: Family Medicine

## 2022-12-27 DIAGNOSIS — I10 Essential (primary) hypertension: Secondary | ICD-10-CM

## 2023-01-11 ENCOUNTER — Other Ambulatory Visit: Payer: Self-pay | Admitting: Family Medicine

## 2023-01-11 DIAGNOSIS — F419 Anxiety disorder, unspecified: Secondary | ICD-10-CM

## 2023-01-21 ENCOUNTER — Other Ambulatory Visit: Payer: Self-pay | Admitting: Family Medicine

## 2023-01-21 DIAGNOSIS — I10 Essential (primary) hypertension: Secondary | ICD-10-CM

## 2023-03-07 ENCOUNTER — Other Ambulatory Visit: Payer: Self-pay | Admitting: Family Medicine

## 2023-03-07 DIAGNOSIS — I10 Essential (primary) hypertension: Secondary | ICD-10-CM

## 2023-03-15 ENCOUNTER — Other Ambulatory Visit: Payer: Self-pay | Admitting: Family Medicine

## 2023-03-15 DIAGNOSIS — F32A Depression, unspecified: Secondary | ICD-10-CM

## 2023-03-23 ENCOUNTER — Other Ambulatory Visit: Payer: Self-pay

## 2023-03-23 ENCOUNTER — Encounter: Payer: Self-pay | Admitting: Family Medicine

## 2023-03-23 ENCOUNTER — Ambulatory Visit: Payer: 59 | Attending: Family Medicine | Admitting: Family Medicine

## 2023-03-23 VITALS — BP 172/70 | HR 85 | Ht 63.0 in | Wt 140.2 lb

## 2023-03-23 DIAGNOSIS — E7800 Pure hypercholesterolemia, unspecified: Secondary | ICD-10-CM

## 2023-03-23 DIAGNOSIS — Z1211 Encounter for screening for malignant neoplasm of colon: Secondary | ICD-10-CM

## 2023-03-23 DIAGNOSIS — Z23 Encounter for immunization: Secondary | ICD-10-CM

## 2023-03-23 DIAGNOSIS — M19041 Primary osteoarthritis, right hand: Secondary | ICD-10-CM

## 2023-03-23 DIAGNOSIS — F32A Depression, unspecified: Secondary | ICD-10-CM

## 2023-03-23 DIAGNOSIS — I10 Essential (primary) hypertension: Secondary | ICD-10-CM

## 2023-03-23 DIAGNOSIS — M19042 Primary osteoarthritis, left hand: Secondary | ICD-10-CM

## 2023-03-23 DIAGNOSIS — F419 Anxiety disorder, unspecified: Secondary | ICD-10-CM | POA: Diagnosis not present

## 2023-03-23 DIAGNOSIS — M25461 Effusion, right knee: Secondary | ICD-10-CM | POA: Diagnosis not present

## 2023-03-23 MED ORDER — AMLODIPINE BESYLATE 10 MG PO TABS: 10.0000 mg | ORAL_TABLET | Freq: Every day | ORAL | 3 refills | Status: DC

## 2023-03-23 MED ORDER — PROPRANOLOL HCL ER 80 MG PO CP24
80.0000 mg | ORAL_CAPSULE | Freq: Every day | ORAL | 0 refills | Status: DC
  Filled 2023-03-23: qty 100, 100d supply, fill #0
  Filled 2023-03-23: qty 50, 50d supply, fill #0
  Filled 2023-04-26 – 2023-04-30 (×2): qty 50, 50d supply, fill #1

## 2023-03-23 MED ORDER — TRAZODONE HCL 50 MG PO TABS: 50.0000 mg | ORAL_TABLET | Freq: Every day | ORAL | 3 refills | Status: DC

## 2023-03-23 MED ORDER — ATORVASTATIN CALCIUM 20 MG PO TABS: 20.0000 mg | ORAL_TABLET | Freq: Every day | ORAL | 2 refills | Status: DC

## 2023-03-23 MED ORDER — ISOSORBIDE MONONITRATE ER 30 MG PO TB24: 30.0000 mg | ORAL_TABLET | Freq: Every day | ORAL | 0 refills | Status: DC

## 2023-03-23 MED ORDER — PREDNISONE 20 MG PO TABS
20.0000 mg | ORAL_TABLET | Freq: Every day | ORAL | 0 refills | Status: AC
  Filled 2023-03-23: qty 5, 5d supply, fill #0

## 2023-03-23 MED ORDER — HYDROXYZINE HCL 25 MG PO TABS: ORAL_TABLET | Freq: Three times a day (TID) | ORAL | 2 refills | Status: DC | PRN

## 2023-03-23 MED ORDER — ESCITALOPRAM OXALATE 20 MG PO TABS: ORAL_TABLET | Freq: Every day | ORAL | 3 refills | Status: DC

## 2023-03-23 MED ORDER — MELOXICAM 7.5 MG PO TABS: 7.5000 mg | ORAL_TABLET | Freq: Every day | ORAL | 0 refills | Status: DC

## 2023-03-23 MED ORDER — LISINOPRIL 40 MG PO TABS: 40.0000 mg | ORAL_TABLET | Freq: Every day | ORAL | 3 refills | Status: DC

## 2023-03-23 NOTE — Patient Instructions (Signed)
Fluid in the Knee: What It Means  Fluid in the knee, or knee effusion, happens when extra fluid builds up in or around your knee. In a healthy knee, there's a small amount of fluid. If there's extra fluid in your knee, it may mean something is wrong with your knee. It can be caused by: Very bad arthritis. An injury. An infection. An autoimmune disease. This is when your body's defense system (immune system) attacks healthy body tissues by mistake. When there's too much fluid in your knee, you'll have pain and swelling. Your knee may feel stiff or too tight. This makes it hard for you to bend or move your knee. Your treatment will be based on what's causing the extra fluid in your knee. Follow these instructions at home: If you have a brace: Wear the brace as told. Take it off only if your health care provider says you can. Check the skin around it every day. Tell your provider if you see problems. Loosen it if your toes tingle, are numb, or turn cold and blue. Keep it clean. If the brace isn't waterproof: Do not let it get wet. Cover it when you take a bath or shower. Use a cover that doesn't let any water in. Managing pain, stiffness, and swelling  Use ice or an ice pack as told. If you have a brace that you can take off, remove it only as told. Place a towel between your skin and the ice. Leave the ice on for 20 minutes, 2-3 times a day. If your skin turns red, take off the ice right away to prevent skin damage. The risk of damage is higher if you can't feel pain, heat, or cold. Move your toes often to reduce stiffness and swelling. Raise your knee above the level of your heart while you're sitting or lying down. Use pillows as needed. Activity Rest as told. Do not stand or walk on your injured leg until you're told it's OK. Use crutches or a walker. Exercise as told. Ask what things are safe for you to do at home. Ask when it's safe to drive. General instructions Take your  medicines only as told. Do not smoke, vape, or use nicotine or tobacco. Wear an elastic bandage or a wrap that puts pressure on your knee (compression wrap) as told. Keep all follow-up visits. Your provider will check your healing. Contact a health care provider if: You have a fever or chills. You have swelling or redness in your knee that gets worse or doesn't get better. Your pain doesn't get better with medicine. Your toes tingle or feel numb. Get help right away if: Your leg turns pale, cool, or blue. This information is not intended to replace advice given to you by your health care provider. Make sure you discuss any questions you have with your health care provider. Document Revised: 07/30/2022 Document Reviewed: 07/30/2022 Elsevier Patient Education  2024 [employer].

## 2023-03-23 NOTE — Progress Notes (Signed)
Subjective:  Patient ID: [name], female    DOB: 08/11/1950  Age: 73 y.o. MRN: [id number]  CC: Hypertension (Right knee pain)   HPI [name] is a 73 y.o. year old female with a history of hypertension, anxiety and depression, history of breast cancer (status post left mastectomy over 16 years ago) osteoarthritis of the hand.   Interval History: Discussed the use of AI scribe software for clinical note transcription with the patient, who gave verbal consent to proceed.  The patient, with a history of hypertension and arthritis, presents with elevated blood pressure and right knee pain. She reports running out of her nighttime blood pressure medication, propranolol, and believes this is the cause of her elevated blood pressure. She also reports that her right knee has been swelling and causing pain, which she has been managing with Tylenol arthritis pills. However, she expresses that the Tylenol is not sufficient for pain control and she needs something stronger. She also mentions running out of meloxicam, which she used to take for her knee pain.  In addition to these issues, the patient requests a flu shot and a shingles shot. She also takes medications for anxiety and depression, including Lexapro and hydroxyzine, and trazodone for sleep. She expresses concern about her elevated blood pressure and knee pain, but overall, she feels alright.        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   Hypercholesteremia    dx 2010   Hypertension    dx 1996   Leukopenia 07/10/2011   Stable compared to 2012    Past Surgical History:  Procedure Laterality Date   ABDOMINAL HYSTERECTOMY     late 1990s.   BREAST SURGERY Left    2004   MASTECTOMY  2005   left  side    Family History  Problem Relation Age of Onset   Hypertension Mother    Arthritis Sister        RA    Social History   Socioeconomic History   Marital status: Divorced    Spouse name: Not on file   Number of children: Not on file   Years of education: Not on file   Highest education level: Not on file  Occupational History   Not on file  Tobacco Use   Smoking status: Former    Current packs/day: 0.00    Types: Cigarettes    Quit date: 03/20/1994    Years since quitting: 29.0   Smokeless tobacco: Never  Substance and Sexual Activity   Alcohol use: No   Drug use: No   Sexual activity: Yes    Birth control/protection: Condom  Other Topics Concern   Not on file  Social History Narrative   Recently separated from husband of 39 years.    Social Drivers of Corporate investment banker Strain: Low Risk  (10/07/2022)   Overall Financial Resource Strain (CARDIA)    Difficulty of Paying Living Expenses: Not hard at all  Food Insecurity: No Food Insecurity (10/07/2022)   Hunger Vital Sign    Worried About Running Out of Food in the Last Year: Never true    Ran Out of Food in the Last Year: Never true  Transportation Needs: No Transportation Needs (10/07/2022)  PRAPARE - Administrator, Civil Service (Medical): No    Lack of Transportation (Non-Medical): No  Physical Activity: Sufficiently Active (10/07/2022)   Exercise Vital Sign    Days of Exercise per Week: 5 days    Minutes of Exercise per Session: 30 min  Stress: Stress Concern Present (02/13/2022)   [employer] of Occupational Health - Occupational Stress Questionnaire    Feeling of Stress : To some extent  Social Connections: Moderately Integrated (10/07/2022)   Social Connection and Isolation Panel [NHANES]    Frequency of Communication with Friends and Family: More than three times a week    Frequency of Social Gatherings with Friends and Family: Three times a week    Attends Religious Services: 1  to 4 times per year    Active Member of Clubs or Organizations: Yes    Attends Banker Meetings: 1 to 4 times per year    Marital Status: Divorced    Allergies  Allergen Reactions   Fish Allergy Shortness Of Breath   Strawberry Extract Shortness Of Breath   Aspirin Nausea Only    Outpatient Medications Prior to Visit  Medication Sig Dispense Refill   acetaminophen (TYLENOL) 325 MG tablet Take 2 tablets (650 mg total) by mouth every 6 (six) hours as needed. 30 tablet 0   cetirizine (ZYRTEC) 10 MG tablet Take 1 tablet (10 mg total) by mouth daily. 30 tablet 6   Elastic Bandages & Supports (WRIST SPLINT/ELASTIC LEFT MED) MISC 1 each by Does not apply route daily. 1 each 0   Elastic Bandages & Supports (WRIST SPLINT/ELASTIC RIGHT MED) MISC 1 each by Does not apply route daily. 1 each 0   amLODipine (NORVASC) 10 MG tablet TAKE 1 TABLET BY MOUTH DAILY 100 tablet 0   atorvastatin (LIPITOR) 20 MG tablet TAKE 1 TABLET BY MOUTH DAILY 100 tablet 2   escitalopram (LEXAPRO) 20 MG tablet TAKE 1 TABLET (20 MG TOTAL) BY MOUTH DAILY. 90 tablet 3   hydrOXYzine (ATARAX) 25 MG tablet TAKE 1 TABLET (25 MG TOTAL) BY MOUTH 3 (THREE) TIMES DAILY AS NEEDED. 90 tablet 2   isosorbide mononitrate (IMDUR) 30 MG 24 hr tablet TAKE 1 TABLET BY MOUTH DAILY 100 tablet 0   lisinopril (ZESTRIL) 40 MG tablet TAKE 1 TABLET BY MOUTH DAILY 100 tablet 0   meloxicam (MOBIC) 7.5 MG tablet Take 1 tablet (7.5 mg total) by mouth daily. 100 tablet 0   propranolol ER (INDERAL LA) 80 MG 24 hr capsule Take 1 capsule (80 mg total) by mouth at bedtime. 100 capsule 2   traZODone (DESYREL) 50 MG tablet TAKE 1 TABLET BY MOUTH AT  BEDTIME 30 tablet 0   No facility-administered medications prior to visit.     ROS Review of Systems  Constitutional:  Negative for activity change and appetite change.  HENT:  Negative for sinus pressure and sore throat.   Respiratory:  Negative for chest tightness, shortness of breath and  wheezing.   Cardiovascular:  Negative for chest pain and palpitations.  Gastrointestinal:  Negative for abdominal distention, abdominal pain and constipation.  Genitourinary: Negative.   Musculoskeletal:        See HPI  Psychiatric/Behavioral:  Negative for behavioral problems and dysphoric mood.     Objective:  BP (!) 172/70   Pulse 85   Ht 5\' 3"  (1.6 m)   Wt 140 lb 3.2 oz (63.6 kg)   SpO2 100%   BMI 24.84 kg/m  03/23/2023    4:51 PM 03/23/2023    4:22 PM 10/07/2022    6:12 PM  BP/Weight  Systolic BP 172 178 --  Diastolic BP 70 71 --  Wt. (Lbs)  140.2 149  BMI  24.84 kg/m2 26.39 kg/m2      Physical Exam Constitutional:      Appearance: She is well-developed.  Cardiovascular:     Rate and Rhythm: Normal rate.     Heart sounds: Normal heart sounds. No murmur heard. Pulmonary:     Effort: Pulmonary effort is normal.     Breath sounds: Normal breath sounds. No wheezing or rales.  Chest:     Chest wall: No tenderness.  Abdominal:     General: Bowel sounds are normal. There is no distension.     Palpations: Abdomen is soft. There is no mass.     Tenderness: There is no abdominal tenderness.  Musculoskeletal:     Right lower leg: No edema.     Left lower leg: No edema.     Comments: Edema of right knee in inferior lateral aspect with associated tenderness on range of motion  Neurological:     Mental Status: She is alert and oriented to person, place, and time.     Gait: Gait abnormal (ambulates with a limp).  Psychiatric:        Mood and Affect: Mood normal.        Latest Ref Rng & Units 09/15/2022    4:57 PM 01/07/2022    4:42 PM 07/07/2021    4:33 PM  CMP  Glucose 70 - 99 mg/dL 161  096  70   BUN 8 - 27 mg/dL 14  19  22    Creatinine 0.57 - 1.00 mg/dL 0.45  4.09  8.11   Sodium 134 - 144 mmol/L 143  137  135   Potassium 3.5 - 5.2 mmol/L 3.8  4.3  4.2   Chloride 96 - 106 mmol/L 101  99  97   CO2 20 - 29 mmol/L 22  24  21    Calcium 8.7 - 10.3 mg/dL 91.4   78.2  95.6   Total Protein 6.0 - 8.5 g/dL 6.5  7.0    Total Bilirubin 0.0 - 1.2 mg/dL <2.1  0.6    Alkaline Phos 44 - 121 IU/L 71  70    AST 0 - 40 IU/L 28  60    ALT 0 - 32 IU/L 24  42      Lipid Panel     Component Value Date/Time   CHOL 155 01/07/2022 1642   TRIG 150 (H) 01/07/2022 1642   HDL 70 01/07/2022 1642   CHOLHDL 2.4 07/15/2020 0854   CHOLHDL 3.5 03/20/2014 1118   VLDL 20 03/20/2014 1118   LDLCALC 60 01/07/2022 1642    CBC    Component Value Date/Time   WBC 5.2 01/07/2022 1642   WBC 4.5 03/20/2014 1118   RBC 3.97 01/07/2022 1642   RBC 4.52 03/20/2014 1118   HGB 11.5 01/07/2022 1642   HCT 34.1 01/07/2022 1642   HCT 38 08/12/2011 1445   PLT 171 01/07/2022 1642   MCV 86 01/07/2022 1642   MCV 82.2 08/12/2011 1445   MCH 29.0 01/07/2022 1642   MCH 28.8 03/20/2014 1118   MCHC 33.7 01/07/2022 1642   MCHC 34.3 03/20/2014 1118   RDW 12.7 01/07/2022 1642   LYMPHSABS 2.8 01/07/2022 1642   MONOABS 0.3 07/10/2011 1327   EOSABS 0.2 01/07/2022 1642  BASOSABS 0.0 01/07/2022 1642    Lab Results  Component Value Date   HGBA1C 5.40 04/19/2015    Assessment & Plan:      Hypertension Elevated blood pressure likely due to running out of Propranolol. -Refill Propranolol and other medications  -Recheck blood pressure in one month. -Counseled on blood pressure goal of less than 130/80, low-sodium, DASH diet, medication compliance, 150 minutes of moderate intensity exercise per week. Discussed medication compliance, adverse effects.   Right Knee Pain/osteoarthritis Chronic pain, currently rated as 8/10. Swelling suggestive of effusion. Pain not adequately controlled with Tylenol. -Refill Meloxicam. -Prescribe Prednisone for acute pain relief, to be picked up at local pharmacy. -Refer to orthopedics for evaluation and possible fluid drainage.   Hyperlipidemia -Due for lipid panel; will send off when she is fasting -Continue statin -Low-cholesterol diet  Anxiety  and depression -Controlled -Continue Celexa and hydroxyzine  General Health Maintenance -Administer influenza vaccine today. -Advise patient to receive shingles vaccine at [facility].  -Cologuard test ordered to screen for colon cancer         Meds ordered this encounter  Medications   amLODipine (NORVASC) 10 MG tablet    Sig: Take 1 tablet (10 mg total) by mouth daily.    Dispense:  100 tablet    Refill:  3   atorvastatin (LIPITOR) 20 MG tablet    Sig: Take 1 tablet (20 mg total) by mouth daily.    Dispense:  100 tablet    Refill:  2    Please send a replace/new response with 100-Day Supply if appropriate to maximize member benefit. Requesting 1 year supply.   escitalopram (LEXAPRO) 20 MG tablet    Sig: TAKE 1 TABLET (20 MG TOTAL) BY MOUTH DAILY.    Dispense:  90 tablet    Refill:  3   hydrOXYzine (ATARAX) 25 MG tablet    Sig: TAKE 1 TABLET (25 MG TOTAL) BY MOUTH 3 (THREE) TIMES DAILY AS NEEDED.    Dispense:  90 tablet    Refill:  2   isosorbide mononitrate (IMDUR) 30 MG 24 hr tablet    Sig: Take 1 tablet (30 mg total) by mouth daily.    Dispense:  100 tablet    Refill:  0    Please send a replace/new response with 100-Day Supply if appropriate to maximize member benefit. Requesting 1 year supply.   lisinopril (ZESTRIL) 40 MG tablet    Sig: Take 1 tablet (40 mg total) by mouth daily.    Dispense:  100 tablet    Refill:  3   meloxicam (MOBIC) 7.5 MG tablet    Sig: Take 1 tablet (7.5 mg total) by mouth daily.    Dispense:  100 tablet    Refill:  0   propranolol ER (INDERAL LA) 80 MG 24 hr capsule    Sig: Take 1 capsule (80 mg total) by mouth at bedtime.    Dispense:  100 capsule    Refill:  0    Please send a replace/new response with 100-Day Supply if appropriate to maximize member benefit. Requesting 1 year supply.   traZODone (DESYREL) 50 MG tablet    Sig: Take 1 tablet (50 mg total) by mouth at bedtime.    Dispense:  100 tablet    Refill:  3   predniSONE (DELTASONE)  20 MG tablet    Sig: Take 1 tablet (20 mg total) by mouth daily with breakfast.    Dispense:  5 tablet    Refill:  0    Follow-up: Return in about 1 month (around 04/23/2023) for Blood Pressure follow-up with PCP.       Hoy Register, MD, FAAFP. [facility] [place], [place] [phone]   03/23/2023, 5:08 PM

## 2023-03-24 ENCOUNTER — Other Ambulatory Visit: Payer: Self-pay | Admitting: Family Medicine

## 2023-03-24 ENCOUNTER — Ambulatory Visit: Payer: 59 | Attending: Family Medicine

## 2023-03-24 LAB — CMP14+EGFR
ALT: 42 [IU]/L — ABNORMAL HIGH (ref 0–32)
AST: 46 [IU]/L — ABNORMAL HIGH (ref 0–40)
Albumin: 4.5 g/dL (ref 3.8–4.8)
Alkaline Phosphatase: 80 [IU]/L (ref 44–121)
BUN/Creatinine Ratio: 20 (ref 12–28)
BUN: 22 mg/dL (ref 8–27)
Bilirubin Total: 0.3 mg/dL (ref 0.0–1.2)
CO2: 19 mmol/L — ABNORMAL LOW (ref 20–29)
Calcium: 11.1 mg/dL — ABNORMAL HIGH (ref 8.7–10.3)
Chloride: 100 mmol/L (ref 96–106)
Creatinine, Ser: 1.08 mg/dL — ABNORMAL HIGH (ref 0.57–1.00)
Globulin, Total: 2.8 g/dL (ref 1.5–4.5)
Glucose: 97 mg/dL (ref 70–99)
Potassium: 3.8 mmol/L (ref 3.5–5.2)
Sodium: 139 mmol/L (ref 134–144)
Total Protein: 7.3 g/dL (ref 6.0–8.5)
eGFR: 55 mL/min/{1.73_m2} — ABNORMAL LOW (ref >59)

## 2023-03-25 ENCOUNTER — Ambulatory Visit: Payer: 59 | Admitting: Physician Assistant

## 2023-03-25 ENCOUNTER — Encounter: Payer: Self-pay | Admitting: Physician Assistant

## 2023-03-25 ENCOUNTER — Other Ambulatory Visit (INDEPENDENT_AMBULATORY_CARE_PROVIDER_SITE_OTHER): Payer: 59

## 2023-03-25 DIAGNOSIS — M25561 Pain in right knee: Secondary | ICD-10-CM | POA: Diagnosis not present

## 2023-03-25 DIAGNOSIS — M1711 Unilateral primary osteoarthritis, right knee: Secondary | ICD-10-CM | POA: Diagnosis not present

## 2023-03-25 LAB — PTH, INTACT AND CALCIUM
Calcium: 11.2 mg/dL — ABNORMAL HIGH (ref 8.7–10.3)
PTH: 38 pg/mL (ref 15–65)

## 2023-03-25 MED ORDER — METHYLPREDNISOLONE ACETATE 40 MG/ML IJ SUSP
40.0000 mg | INTRAMUSCULAR | Status: AC | PRN
  Administered 2023-03-25: 40 mg via INTRA_ARTICULAR

## 2023-03-25 MED ORDER — LIDOCAINE HCL 1 % IJ SOLN
3.0000 mL | INTRAMUSCULAR | Status: AC | PRN
  Administered 2023-03-25: 3 mL

## 2023-03-25 NOTE — Progress Notes (Signed)
Office Visit Note   Patient: [name]           Date of Birth: November 23, 1950           MRN: [id number] Visit Date: 03/25/2023              Requested by: Hoy Register, MD [address] [place] [address],  [place] [address] PCP: Hoy Register, MD   Assessment & Plan: Visit Diagnoses:  1. Right knee pain, unspecified chronicity   2. Unilateral primary osteoarthritis, right knee     Plan: Patient is a pleasant 73 year old woman who comes in with a long history of right knee pain.  She denies any injury.  She has not really had any treatment for this.  Recently has been getting worse.  She denies any fever or chills.  She is very sensitive to touch over her knee.  She is but pain seems to ambulate distally and proximally from the knee.  Does not have any groin pain.  She is neurovascular intact.  Would like to try a steroid injection today she really does not have any fluid to aspirate.  Will go forward with this today.  May follow-up with me as needed  Follow-Up Instructions: No follow-ups on file.   Orders:  Orders Placed This Encounter  Procedures   XR KNEE 3 VIEW RIGHT   No orders of the defined types were placed in this encounter.     Procedures: Large Joint Inj on 03/25/2023 11:22 AM Indications: pain and diagnostic evaluation Details: 25 G 1.5 in needle, anteromedial approach  Arthrogram: No  Medications: 40 mg methylPREDNISolone acetate 40 MG/ML; 3 mL lidocaine 1 % Outcome: tolerated well, no immediate complications Procedure, treatment alternatives, risks and benefits explained, specific risks discussed. Consent was given by the patient.       Clinical Data: No additional findings.   Subjective: Chief Complaint  Patient presents with   Left Knee - Pain    HPI  Review of Systems  All other systems reviewed and are negative.    Objective: Vital Signs: There were no vitals taken for this visit.  Physical Exam Constitutional:       Appearance: Normal appearance.  Pulmonary:     Effort: Pulmonary effort is normal.  Skin:    General: Skin is warm and dry.  Neurological:     Mental Status: She is alert.  Psychiatric:        Mood and Affect: Mood normal.        Behavior: Behavior normal.    Ortho Exam She is neurovascular intact she has a sense of grinding with range of motion no effusion compartments are soft and compressible no evidence of infection Specialty Comments:  No specialty comments available.  Imaging: XR KNEE 3 VIEW RIGHT Result Date: 03/25/2023 3 views of the right knee were taken today she does have some joint space narrowing sclerotic changes    PMFS History: Patient Active Problem List   Diagnosis Date Noted   Pain in right knee 03/25/2023   Unilateral primary osteoarthritis, right knee 03/25/2023   Hypercholesteremia 06/02/2017   Abnormal mammogram of right breast 09/09/2015   Allergic rhinitis 02/05/2015   Osteoarthritis of left hand 02/05/2015   Osteoarthritis of hand, right 09/14/2014   Grieving 05/25/2014   HTN (hypertension) 03/20/2014   Anxiety and depression 03/20/2014   Hepatitis C 07/17/2011   Past Medical History:  Diagnosis Date   Breast cancer (HCC) 2004   left breast,  Paget's disease, full involvement of nipple with invasive ductal carcinoma   Hepatitis C 07/17/2011   Positive Hepatitis C Antibody test and then confirmed with Hepatitis C Virus RNA by PCR.   Hypercholesteremia    dx 2010   Hypertension    dx 1996   Leukopenia 07/10/2011   Stable compared to 2012    Family History  Problem Relation Age of Onset   Hypertension Mother    Arthritis Sister        RA    Past Surgical History:  Procedure Laterality Date   ABDOMINAL HYSTERECTOMY     late 1990s.   BREAST SURGERY Left    2004   MASTECTOMY  2005   left side   Social History   Occupational History   Not on file  Tobacco Use   Smoking status: Former    Current packs/day: 0.00    Types:  Cigarettes    Quit date: 03/20/1994    Years since quitting: 29.0   Smokeless tobacco: Never  Substance and Sexual Activity   Alcohol use: No   Drug use: No   Sexual activity: Yes    Birth control/protection: Condom

## 2023-03-29 ENCOUNTER — Other Ambulatory Visit: Payer: Self-pay | Admitting: Family Medicine

## 2023-04-01 LAB — VITAMIN D 25 HYDROXY (VIT D DEFICIENCY, FRACTURES): Vit D, 25-Hydroxy: 29.1 ng/mL — ABNORMAL LOW (ref 30.0–100.0)

## 2023-04-01 LAB — SPECIMEN STATUS REPORT

## 2023-04-05 ENCOUNTER — Other Ambulatory Visit: Payer: Self-pay

## 2023-04-26 ENCOUNTER — Ambulatory Visit: Payer: 59 | Attending: Family Medicine | Admitting: Family Medicine

## 2023-04-26 ENCOUNTER — Other Ambulatory Visit: Payer: Self-pay

## 2023-04-26 ENCOUNTER — Encounter: Payer: Self-pay | Admitting: Family Medicine

## 2023-04-26 DIAGNOSIS — G8929 Other chronic pain: Secondary | ICD-10-CM

## 2023-04-26 DIAGNOSIS — I10 Essential (primary) hypertension: Secondary | ICD-10-CM | POA: Diagnosis not present

## 2023-04-26 DIAGNOSIS — M25561 Pain in right knee: Secondary | ICD-10-CM | POA: Diagnosis not present

## 2023-04-26 NOTE — Progress Notes (Signed)
 Subjective:  Patient ID: [name], female    DOB: September 21, 1950  Age: 73 y.o. MRN: [id number]  CC: Hypertension (Right knee pain)   HPI [name] is a 73 y.o. year old female with a history of  hypertension, anxiety and depression, history of breast cancer (status post left mastectomy over 16 years ago) osteoarthritis of the hand.   Interval History: Discussed the use of AI scribe software for clinical note transcription with the patient, who gave verbal consent to proceed.  She presents with ongoing right knee pain despite a steroid injection one month ago. She reports that the pain has improved but is still present. She has been using a knee brace, which provides some relief. She has been taking meloxicam for the knee pain, which provides minimal relief. She is not open to knee replacement surgery but would consider other interventions such as 'cushioning injections'.  She ambulates with a cane.  The patient also reports that her blood pressure has been high, but it has improved since she started taking her propranolol again. She had stopped taking it because she ran out, but she has since refilled her prescription. She also takes amlodipine and Lisinopril for her hypertension.  The patient's calcium levels have been slightly elevated, and she is not taking any calcium supplements. She has been taking over-the-counter vitamin D supplements because her levels were slightly low in January.        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   Hypercholesteremia    dx 2010   Hypertension    dx 1996   Leukopenia 07/10/2011   Stable compared to 2012    Past Surgical History:  Procedure Laterality Date   ABDOMINAL HYSTERECTOMY     late 1990s.   BREAST  SURGERY Left    2004   MASTECTOMY  2005   left side    Family History  Problem Relation Age of Onset   Hypertension Mother    Arthritis Sister        RA    Social History   Socioeconomic History   Marital status: Divorced    Spouse name: Not on file   Number of children: Not on file   Years of education: Not on file   Highest education level: Not on file  Occupational History   Not on file  Tobacco Use   Smoking status: Former    Current packs/day: 0.00    Types: Cigarettes    Quit date: 03/20/1994    Years since quitting: 29.1   Smokeless tobacco: Never  Substance and Sexual Activity   Alcohol use: No   Drug use: No   Sexual activity: Yes    Birth control/protection: Condom  Other Topics Concern   Not on file  Social History Narrative   Recently separated from husband of 39 years.    Social Drivers of Corporate investment banker Strain: Low Risk  (10/07/2022)   Overall Financial Resource Strain (CARDIA)    Difficulty of Paying Living Expenses: Not hard at all  Food Insecurity: No Food Insecurity (10/07/2022)   Hunger Vital Sign    Worried About Running Out of Food in the Last Year: Never true    Ran Out of Food in the  Last Year: Never true  Transportation Needs: No Transportation Needs (10/07/2022)   PRAPARE - Administrator, Civil Service (Medical): No    Lack of Transportation (Non-Medical): No  Physical Activity: Sufficiently Active (10/07/2022)   Exercise Vital Sign    Days of Exercise per Week: 5 days    Minutes of Exercise per Session: 30 min  Stress: Stress Concern Present (02/13/2022)   [employer] of Occupational Health - Occupational Stress Questionnaire    Feeling of Stress : To some extent  Social Connections: Moderately Integrated (10/07/2022)   Social Connection and Isolation Panel [NHANES]    Frequency of Communication with Friends and Family: More than three times a week    Frequency of Social Gatherings with Friends and Family:  Three times a week    Attends Religious Services: 1 to 4 times per year    Active Member of Clubs or Organizations: Yes    Attends Banker Meetings: 1 to 4 times per year    Marital Status: Divorced    Allergies  Allergen Reactions   Fish Allergy Shortness Of Breath   Strawberry Extract Shortness Of Breath   Aspirin Nausea Only    Outpatient Medications Prior to Visit  Medication Sig Dispense Refill   acetaminophen (TYLENOL) 325 MG tablet Take 2 tablets (650 mg total) by mouth every 6 (six) hours as needed. 30 tablet 0   amLODipine (NORVASC) 10 MG tablet Take 1 tablet (10 mg total) by mouth daily. 100 tablet 3   atorvastatin (LIPITOR) 20 MG tablet Take 1 tablet (20 mg total) by mouth daily. 100 tablet 2   cetirizine (ZYRTEC) 10 MG tablet Take 1 tablet (10 mg total) by mouth daily. 30 tablet 6   Elastic Bandages & Supports (WRIST SPLINT/ELASTIC LEFT MED) MISC 1 each by Does not apply route daily. 1 each 0   Elastic Bandages & Supports (WRIST SPLINT/ELASTIC RIGHT MED) MISC 1 each by Does not apply route daily. 1 each 0   escitalopram (LEXAPRO) 20 MG tablet TAKE 1 TABLET (20 MG TOTAL) BY MOUTH DAILY. 90 tablet 3   hydrOXYzine (ATARAX) 25 MG tablet TAKE 1 TABLET (25 MG TOTAL) BY MOUTH 3 (THREE) TIMES DAILY AS NEEDED. 90 tablet 2   isosorbide mononitrate (IMDUR) 30 MG 24 hr tablet Take 1 tablet (30 mg total) by mouth daily. 100 tablet 0   lisinopril (ZESTRIL) 40 MG tablet Take 1 tablet (40 mg total) by mouth daily. 100 tablet 3   meloxicam (MOBIC) 7.5 MG tablet Take 1 tablet (7.5 mg total) by mouth daily. 100 tablet 0   predniSONE (DELTASONE) 20 MG tablet Take 1 tablet (20 mg total) by mouth daily with breakfast. 5 tablet 0   propranolol ER (INDERAL LA) 80 MG 24 hr capsule Take 1 capsule (80 mg total) by mouth at bedtime. 100 capsule 0   traZODone (DESYREL) 50 MG tablet Take 1 tablet (50 mg total) by mouth at bedtime. 100 tablet 3   No facility-administered medications prior to  visit.     ROS Review of Systems  Constitutional:  Negative for activity change and appetite change.  HENT:  Negative for sinus pressure and sore throat.   Respiratory:  Negative for chest tightness, shortness of breath and wheezing.   Cardiovascular:  Negative for chest pain and palpitations.  Gastrointestinal:  Negative for abdominal distention, abdominal pain and constipation.  Genitourinary: Negative.   Musculoskeletal:        See HPI  Psychiatric/Behavioral:  Negative  for behavioral problems and dysphoric mood.     Objective:  BP (!) 159/70   Pulse 62   Ht 5\' 3"  (1.6 m)   Wt 141 lb (64 kg)   SpO2 100%   BMI 24.98 kg/m      04/26/2023    3:26 PM 04/26/2023    2:49 PM 03/23/2023    4:51 PM  BP/Weight  Systolic BP 159 145 172  Diastolic BP 70 71 70  Wt. (Lbs)  141   BMI  24.98 kg/m2       Physical Exam Constitutional:      Appearance: She is well-developed.  Cardiovascular:     Rate and Rhythm: Normal rate.     Heart sounds: Normal heart sounds. No murmur heard. Pulmonary:     Effort: Pulmonary effort is normal.     Breath sounds: Normal breath sounds. No wheezing or rales.  Chest:     Chest wall: No tenderness.  Abdominal:     General: Bowel sounds are normal. There is no distension.     Palpations: Abdomen is soft. There is no mass.     Tenderness: There is no abdominal tenderness.  Musculoskeletal:     Right lower leg: No edema.     Left lower leg: No edema.     Comments: Slight tenderness on lateral aspect of right knee joint with additional tenderness on range of motion of right knee joint Slight edema on medial aspect of right knee joint. Left knee joint is normal   Neurological:     Mental Status: She is alert and oriented to person, place, and time.     Gait: Gait abnormal.  Psychiatric:        Mood and Affect: Mood normal.        Latest Ref Rng & Units 03/24/2023   11:41 AM 03/23/2023    5:04 PM 09/15/2022    4:57 PM  CMP  Glucose 70 -  99 mg/dL  97  914   BUN 8 - 27 mg/dL  22  14   Creatinine 7.82 - 1.00 mg/dL  9.56  2.13   Sodium 086 - 144 mmol/L  139  143   Potassium 3.5 - 5.2 mmol/L  3.8  3.8   Chloride 96 - 106 mmol/L  100  101   CO2 20 - 29 mmol/L  19  22   Calcium 8.7 - 10.3 mg/dL 57.8  46.9  62.9   Total Protein 6.0 - 8.5 g/dL  7.3  6.5   Total Bilirubin 0.0 - 1.2 mg/dL  0.3  <5.2   Alkaline Phos 44 - 121 IU/L  80  71   AST 0 - 40 IU/L  46  28   ALT 0 - 32 IU/L  42  24     Lipid Panel     Component Value Date/Time   CHOL 155 01/07/2022 1642   TRIG 150 (H) 01/07/2022 1642   HDL 70 01/07/2022 1642   CHOLHDL 2.4 07/15/2020 0854   CHOLHDL 3.5 03/20/2014 1118   VLDL 20 03/20/2014 1118   LDLCALC 60 01/07/2022 1642    CBC    Component Value Date/Time   WBC 5.2 01/07/2022 1642   WBC 4.5 03/20/2014 1118   RBC 3.97 01/07/2022 1642   RBC 4.52 03/20/2014 1118   HGB 11.5 01/07/2022 1642   HCT 34.1 01/07/2022 1642   HCT 38 08/12/2011 1445   PLT 171 01/07/2022 1642   MCV 86 01/07/2022 1642  MCV 82.2 08/12/2011 1445   MCH 29.0 01/07/2022 1642   MCH 28.8 03/20/2014 1118   MCHC 33.7 01/07/2022 1642   MCHC 34.3 03/20/2014 1118   RDW 12.7 01/07/2022 1642   LYMPHSABS 2.8 01/07/2022 1642   MONOABS 0.3 07/10/2011 1327   EOSABS 0.2 01/07/2022 1642   BASOSABS 0.0 01/07/2022 1642    Lab Results  Component Value Date   HGBA1C 5.40 04/19/2015    Assessment & Plan:      Right Knee Pain Persistent pain despite steroid injection one month ago. Patient is using a brace intermittently and taking Meloxicam with partial relief. Patient is not open to knee replacement at this time. -Continue current management and follow up with orthopedics as needed.  Hypertension Blood pressure is elevated but improved compared to previous visit. Patient is currently taking Amlodipine, Propranolol, and Lisinopril.  -Continue current medications. -Recheck blood pressure in 3 months.  Hypercalcemia Previous labs showed  elevated calcium levels. Patient is not taking any calcium supplements. -Check calcium and Vitamin D levels today.  General Health Maintenance -Declined mammogram, Cologuard test for colon cancer screening, and bone density test. -Follow-up appointment in 6 months.          No orders of the defined types were placed in this encounter.   Follow-up: Return in about 3 months (around 07/24/2023) for Blood Pressure follow-up with PCP.       Hoy Register, MD, FAAFP. [facility] [place], [place] [phone]   04/26/2023, 4:26 PM

## 2023-04-26 NOTE — Patient Instructions (Signed)
 Chronic Knee Pain, Adult Knee pain that lasts longer than 3 months is called chronic knee pain. You may have pain in one or both knees. Symptoms of chronic knee pain may also include swelling and stiffness. Many conditions can cause chronic knee pain. The most common cause is wear and tear of your knee joint as you get older. Other possible causes include: A disease that causes inflammation of the knee, such as rheumatoid arthritis. This usually affects both knees. A condition called inflammatory arthritis, such as gout. An injury to the knee that causes arthritis. An injury to the knee that damages the ligaments. Ligaments are tissues that connect bones to each other. Runner's knee or pain behind the kneecap. Treatment for chronic knee pain depends on the cause. The main treatments for chronic knee pain are: Doing exercises to help your knee move better and get stronger, called physical therapy. Losing weight if you are overweight. This condition may also be treated with medicines, injections, a knee sleeve or brace, and by using crutches. You health care provider may also recommend rest, ice, pressure (compression), and elevation, also called RICE therapy. Follow these instructions at home: If you have a knee sleeve or brace that can be taken off:  Wear the knee sleeve or brace as told by your provider. Take it off only if your provider says that you can. Check the skin around it every day. Tell your provider if you see problems. Loosen the knee sleeve or brace if your toes tingle, are numb, or turn cold and blue. Keep the knee sleeve or brace clean and dry. Bathing If the knee sleeve or brace is not waterproof: Do not let it get wet. Cover it when you take a bath or shower. Use a cover that does not let any water in. Managing pain, stiffness, and swelling     If told, put heat on the area. Do this as often as told. Use the heat source that your provider recommends, such as a moist  heat pack or a heating pad. If you have a knee sleeve or brace that you can take off, remove it as told. Place a towel between your skin and the heat source. Leave the heat on for 20-30 minutes. If told, put ice on the area. If you have a knee sleeve or brace that you can take off, remove it as told. Put ice in a plastic bag. Place a towel between your skin and the bag. Leave the ice on for 20 minutes, 2-3 times a day. If your skin turns bright red, remove the ice or heat right away to prevent skin damage. The risk of damage is higher if you cannot feel pain, heat, or cold. Move your toes often to reduce stiffness and swelling. Raise the injured area above the level of your heart while you are sitting or lying down. Use a pillow to support your foot as needed. Activity Avoid activities where both feet leave the ground at the same time. Avoid running, jumping rope, and doing jumping jacks. Follow the exercise plan that your provider made for you. Your provider may suggest that you: Avoid activities that make knee pain worse. This may mean that you need to change your exercise routines, sports, or job duties. Wear shoes with cushioned soles. Avoid sports that require running and sudden changes in direction. Do physical therapy. Physical therapy helps your knee move better and get stronger. Exercise as told. Do exercises that increase balance and strength, such as  tai chi and yoga. Do not stand or walk on your injured knee until you're told it's okay. Use crutches as told. Return to normal activities when you're told. Ask what things are safe for you to do. General instructions Take your medicines only as told by your provider. If you are overweight, work with your provider and an expert in healthy eating called a dietitian to set goals to lose weight. Losing even a little weight can reduce knee pain. Being overweight can make your knee hurt more. Do not smoke, vape, or use products with  nicotine or tobacco in them. If you need help quitting, talk with your provider. Keep all follow-up visits. Your provider will monitor your pain and try other treatments if needed. Contact a health care provider if: You have knee pain that is not getting better or gets worse. You are not able to do your exercises due to knee pain. Get help right away if: Your knee swells and the swelling becomes worse. You cannot move your knee. You have severe knee pain. This information is not intended to replace advice given to you by your health care provider. Make sure you discuss any questions you have with your health care provider. Document Revised: 11/19/2022 Document Reviewed: 04/13/2022 Elsevier Patient Education  2024 [employer].

## 2023-05-05 ENCOUNTER — Encounter: Payer: Self-pay | Admitting: Family Medicine

## 2023-05-05 LAB — VITAMIN D 1,25 DIHYDROXY
Vitamin D 1, 25 (OH)2 Total: 49 pg/mL
Vitamin D2 1, 25 (OH)2: <10 pg/mL
Vitamin D3 1, 25 (OH)2: 49 pg/mL

## 2023-05-05 LAB — PTH, INTACT AND CALCIUM
Calcium: 10.9 mg/dL — ABNORMAL HIGH (ref 8.7–10.3)
PTH: 38 pg/mL (ref 15–65)

## 2023-05-05 LAB — PTH-RELATED PEPTIDE: PTH-related peptide: <2 pmol/L

## 2023-05-05 LAB — VITAMIN D 25 HYDROXY (VIT D DEFICIENCY, FRACTURES): Vit D, 25-Hydroxy: 36.4 ng/mL (ref 30.0–100.0)

## 2023-05-11 ENCOUNTER — Other Ambulatory Visit: Payer: Self-pay

## 2023-05-24 ENCOUNTER — Other Ambulatory Visit: Payer: Self-pay | Admitting: Family Medicine

## 2023-05-24 DIAGNOSIS — F32A Depression, unspecified: Secondary | ICD-10-CM

## 2023-05-24 DIAGNOSIS — I10 Essential (primary) hypertension: Secondary | ICD-10-CM

## 2023-05-24 NOTE — Telephone Encounter (Signed)
 Copied from CRM [id number]. Topic: Clinical - Medication Refill >> May 24, 2023 11:47 AM Truddie Crumble wrote: Most Recent Primary Care Visit:  Provider: Hoy Register  Department: [facility]  Visit Type: OFFICE VISIT  Date: 04/26/2023  Medication: propranolol ER (INDERAL LA) 80 MG 24 hr capsule  Has the patient contacted their pharmacy? Yes (Agent: If no, request that the patient contact the pharmacy for the refill. If patient does not wish to contact the pharmacy document the reason why and proceed with request.) (Agent: If yes, when and what did the pharmacy advise?)  Is this the correct pharmacy for this prescription? Yes If no, delete pharmacy and type the correct one.  This is the patient's preferred pharmacy:   [facility] - [place], [place] - [address] [place] [address] Phone: [phone] Fax: [phone]   Has the prescription been filled recently? Yes  Is the patient out of the medication? Yes  Has the patient been seen for an appointment in the last year OR does the patient have an upcoming appointment? Yes  Can we respond through MyChart? Yes  Agent: Please be advised that Rx refills may take up to 3 business days. We ask that you follow-up with your pharmacy.

## 2023-05-25 MED ORDER — PROPRANOLOL HCL ER 80 MG PO CP24
80.0000 mg | ORAL_CAPSULE | Freq: Every day | ORAL | 0 refills | Status: DC
Start: 2023-05-25 — End: 2023-07-30

## 2023-05-25 NOTE — Telephone Encounter (Signed)
 Requested Prescriptions  Pending Prescriptions Disp Refills   propranolol ER (INDERAL LA) 80 MG 24 hr capsule 100 capsule 0    Sig: Take 1 capsule (80 mg total) by mouth at bedtime.     Cardiovascular:  Beta Blockers Failed - 05/25/2023  4:50 PM      Failed - Last BP in normal range    BP Readings from Last 1 Encounters:  04/26/23 (!) 159/70         Passed - Last Heart Rate in normal range    Pulse Readings from Last 1 Encounters:  04/26/23 62         Passed - Valid encounter within last 6 months    Recent Outpatient Visits           4 weeks ago Hypercalcemia   [facility] [place]. [facility] Hoy Register, MD   2 months ago Effusion of right knee   [facility] Dept Of [place]. [facility] Hoy Register, MD   8 months ago Screening for colon cancer   [facility] [place] [facility] Of [place]. [facility] Hoy Register, MD   1 year ago Screening for colon cancer   [facility] [place] [facility] Dept Of [place]. [facility] Hoy Register, MD   1 year ago Chronic pain of right knee   [facility] [place] [facility] Of [place]. [facility] Hoy Register, MD       Future Appointments             In 2 months Hoy Register, MD [facility] [place] - [facility] Dept Of [place]. [facility]

## 2023-05-27 ENCOUNTER — Other Ambulatory Visit: Payer: Self-pay | Admitting: Family Medicine

## 2023-05-27 DIAGNOSIS — M19041 Primary osteoarthritis, right hand: Secondary | ICD-10-CM

## 2023-07-02 ENCOUNTER — Other Ambulatory Visit: Payer: Self-pay | Admitting: Family Medicine

## 2023-07-02 DIAGNOSIS — I10 Essential (primary) hypertension: Secondary | ICD-10-CM

## 2023-07-27 ENCOUNTER — Ambulatory Visit: Payer: 59 | Admitting: Family Medicine

## 2023-07-29 ENCOUNTER — Other Ambulatory Visit: Payer: Self-pay | Admitting: Family Medicine

## 2023-07-29 DIAGNOSIS — F32A Depression, unspecified: Secondary | ICD-10-CM

## 2023-07-29 DIAGNOSIS — I10 Essential (primary) hypertension: Secondary | ICD-10-CM

## 2023-09-06 ENCOUNTER — Telehealth: Payer: Self-pay | Admitting: Nurse Practitioner

## 2023-09-06 NOTE — Telephone Encounter (Signed)
 Called pt to confirm appt. Pt will be present.

## 2023-09-07 ENCOUNTER — Encounter: Payer: Self-pay | Admitting: Family Medicine

## 2023-09-07 ENCOUNTER — Telehealth: Payer: Self-pay | Admitting: Family Medicine

## 2023-09-07 ENCOUNTER — Ambulatory Visit: Attending: Family Medicine | Admitting: Family Medicine

## 2023-09-07 VITALS — BP 149/72 | HR 53 | Wt 138.6 lb

## 2023-09-07 DIAGNOSIS — F32A Depression, unspecified: Secondary | ICD-10-CM | POA: Diagnosis not present

## 2023-09-07 DIAGNOSIS — E7800 Pure hypercholesterolemia, unspecified: Secondary | ICD-10-CM | POA: Diagnosis not present

## 2023-09-07 DIAGNOSIS — M1711 Unilateral primary osteoarthritis, right knee: Secondary | ICD-10-CM | POA: Diagnosis not present

## 2023-09-07 DIAGNOSIS — F419 Anxiety disorder, unspecified: Secondary | ICD-10-CM

## 2023-09-07 DIAGNOSIS — I10 Essential (primary) hypertension: Secondary | ICD-10-CM

## 2023-09-07 MED ORDER — TRAMADOL HCL 50 MG PO TABS: 50.0000 mg | ORAL_TABLET | Freq: Every evening | ORAL | 1 refills | Status: AC | PRN

## 2023-09-07 MED ORDER — ATORVASTATIN CALCIUM 20 MG PO TABS: 20.0000 mg | ORAL_TABLET | Freq: Every day | ORAL | 2 refills | Status: AC

## 2023-09-07 MED ORDER — TRAZODONE HCL 50 MG PO TABS: 50.0000 mg | ORAL_TABLET | Freq: Every day | ORAL | 3 refills | Status: AC

## 2023-09-07 NOTE — Patient Instructions (Signed)

## 2023-09-07 NOTE — Progress Notes (Signed)
 Subjective:  Patient ID: Ronal KATHEE Gavel, female    DOB: 01/16/1951  Age: 73 y.o. MRN: [id number]  CC: Medical Management of Chronic Issues (Change in bp meds)     Discussed the use of AI scribe software for clinical note transcription with the patient, who gave verbal consent to proceed.  History of Present Illness TASCHA CASARES is a 73 year old female with  a history of  hypertension, anxiety and depression, history of breast cancer (status post left mastectomy over 16 years ago) osteoarthritis of the hand osteoarthritis who presents with right knee pain.  Today is the first day she has not experienced significant pain in her right knee, which typically worsens with the rain. The pain is usually severe enough to cause a limp, although she was able to walk without pain today. She previously received an injection in her right knee in January but has not had one recently and does not wish to have another. She currently manages her knee pain with extra strength Tylenol , as meloxicam  has not been effective. She is interested in trying a stronger medication for severe pain.  Her blood pressure medication regimen has changed; she was taken off lisinopril  and amlodipine  and placed on a new medication but her acclimation from her insurance company, which she cannot recall. She monitors her blood pressure multiple times a day and notes it was high upon arrival at the clinic, although normal at home. She continues to take propranolol  80 mg daily at bedtime.  She has elevated calcium  levels, which have improved from 11.2 to 10.9, and she takes vitamin D  supplements. She also takes atorvastatin  for cholesterol and trazodone  for sleep. No issues with bowel movements or constipation and no swelling in her left knee.    Past Medical History:  Diagnosis Date   Breast cancer (HCC) 2004   left breast, Paget's disease, full involvement of nipple with invasive ductal carcinoma   Hepatitis C 07/17/2011    Positive Hepatitis C Antibody test and then confirmed with Hepatitis C Virus RNA by PCR.   Hypercholesteremia    dx 2010   Hypertension    dx 1996   Leukopenia 07/10/2011   Stable compared to 2012    Past Surgical History:  Procedure Laterality Date   ABDOMINAL HYSTERECTOMY     late 1990s.   BREAST SURGERY Left    2004   MASTECTOMY  2005   left side    Family History  Problem Relation Age of Onset   Hypertension Mother    Arthritis Sister        RA    Social History   Socioeconomic History   Marital status: Divorced    Spouse name: Not on file   Number of children: Not on file   Years of education: Not on file   Highest education level: Not on file  Occupational History   Not on file  Tobacco Use   Smoking status: Former    Current packs/day: 0.00    Types: Cigarettes    Quit date: 03/20/1994    Years since quitting: 29.4   Smokeless tobacco: Never  Substance and Sexual Activity   Alcohol use: No   Drug use: No   Sexual activity: Yes    Birth control/protection: Condom  Other Topics Concern   Not on file  Social History Narrative   Recently separated from husband of 39 years.    Social Drivers of Corporate investment banker Strain: Low Risk  (  10/07/2022)   Overall Financial Resource Strain (CARDIA)    Difficulty of Paying Living Expenses: Not hard at all  Food Insecurity: No Food Insecurity (10/07/2022)   Hunger Vital Sign    Worried About Running Out of Food in the Last Year: Never true    Ran Out of Food in the Last Year: Never true  Transportation Needs: No Transportation Needs (10/07/2022)   PRAPARE - Administrator, Civil Service (Medical): No    Lack of Transportation (Non-Medical): No  Physical Activity: Sufficiently Active (10/07/2022)   Exercise Vital Sign    Days of Exercise per Week: 5 days    Minutes of Exercise per Session: 30 min  Stress: Stress Concern Present (02/13/2022)   [employer] of Occupational Health - Occupational  Stress Questionnaire    Feeling of Stress : To some extent  Social Connections: Moderately Integrated (10/07/2022)   Social Connection and Isolation Panel    Frequency of Communication with Friends and Family: More than three times a week    Frequency of Social Gatherings with Friends and Family: Three times a week    Attends Religious Services: 1 to 4 times per year    Active Member of Clubs or Organizations: Yes    Attends Banker Meetings: 1 to 4 times per year    Marital Status: Divorced    Allergies  Allergen Reactions   Fish Allergy Shortness Of Breath   Strawberry Extract Shortness Of Breath   Aspirin Nausea Only    Outpatient Medications Prior to Visit  Medication Sig Dispense Refill   acetaminophen  (TYLENOL ) 325 MG tablet Take 2 tablets (650 mg total) by mouth every 6 (six) hours as needed. 30 tablet 0   cetirizine  (ZYRTEC ) 10 MG tablet Take 1 tablet (10 mg total) by mouth daily. 30 tablet 6   Elastic Bandages & Supports (WRIST SPLINT/ELASTIC LEFT MED) MISC 1 each by Does not apply route daily. 1 each 0   Elastic Bandages & Supports (WRIST SPLINT/ELASTIC RIGHT MED) MISC 1 each by Does not apply route daily. 1 each 0   escitalopram  (LEXAPRO ) 20 MG tablet TAKE 1 TABLET (20 MG TOTAL) BY MOUTH DAILY. 90 tablet 3   hydrOXYzine  (ATARAX ) 25 MG tablet TAKE 1 TABLET (25 MG TOTAL) BY MOUTH 3 (THREE) TIMES DAILY AS NEEDED. 90 tablet 2   isosorbide  mononitrate (IMDUR ) 30 MG 24 hr tablet TAKE 1 TABLET BY MOUTH DAILY 100 tablet 2   meloxicam  (MOBIC ) 7.5 MG tablet TAKE 1 TABLET BY MOUTH DAILY 100 tablet 2   predniSONE  (DELTASONE ) 20 MG tablet Take 1 tablet (20 mg total) by mouth daily with breakfast. 5 tablet 0   propranolol  ER (INDERAL  LA) 80 MG 24 hr capsule TAKE 1 CAPSULE BY MOUTH AT  BEDTIME 100 capsule 2   atorvastatin  (LIPITOR) 20 MG tablet Take 1 tablet (20 mg total) by mouth daily. 100 tablet 2   traZODone  (DESYREL ) 50 MG tablet Take 1 tablet (50 mg total) by mouth at  bedtime. 100 tablet 3   amLODipine  (NORVASC ) 10 MG tablet Take 1 tablet (10 mg total) by mouth daily. (Patient not taking: Reported on 09/07/2023) 100 tablet 3   lisinopril  (ZESTRIL ) 40 MG tablet Take 1 tablet (40 mg total) by mouth daily. (Patient not taking: Reported on 09/07/2023) 100 tablet 3   No facility-administered medications prior to visit.     ROS Review of Systems  Constitutional:  Negative for activity change and appetite change.  HENT:  Negative  for sinus pressure and sore throat.   Respiratory:  Negative for chest tightness, shortness of breath and wheezing.   Cardiovascular:  Negative for chest pain and palpitations.  Gastrointestinal:  Negative for abdominal distention, abdominal pain and constipation.  Genitourinary: Negative.   Musculoskeletal:        See HPI  Psychiatric/Behavioral:  Negative for behavioral problems and dysphoric mood.     Objective:  BP (!) 149/72 (BP Location: Right Arm, Cuff Size: Normal)   Pulse (!) 53   Wt 138 lb 9.6 oz (62.9 kg)   SpO2 100%   BMI 24.55 kg/m      09/07/2023    5:14 PM 09/07/2023    4:30 PM 04/26/2023    3:26 PM  BP/Weight  Systolic BP 149 170 159  Diastolic BP 72 72 70  Wt. (Lbs)  138.6   BMI  24.55 kg/m2       Physical Exam Constitutional:      Appearance: She is well-developed.  Cardiovascular:     Rate and Rhythm: Normal rate.     Heart sounds: Normal heart sounds. No murmur heard. Pulmonary:     Effort: Pulmonary effort is normal.     Breath sounds: Normal breath sounds. No wheezing or rales.  Chest:     Chest wall: No tenderness.  Abdominal:     General: Bowel sounds are normal. There is no distension.     Palpations: Abdomen is soft. There is no mass.     Tenderness: There is no abdominal tenderness.  Musculoskeletal:        General: Normal range of motion.     Right lower leg: No edema.     Left lower leg: No edema.  Neurological:     Mental Status: She is alert and oriented to person, place, and  time.  Psychiatric:        Mood and Affect: Mood normal.        Latest Ref Rng & Units 04/26/2023    3:37 PM 03/24/2023   11:41 AM 03/23/2023    5:04 PM  CMP  Glucose 70 - 99 mg/dL   97   BUN 8 - 27 mg/dL   22   Creatinine 9.42 - 1.00 mg/dL   8.91   Sodium 865 - 855 mmol/L   139   Potassium 3.5 - 5.2 mmol/L   3.8   Chloride 96 - 106 mmol/L   100   CO2 20 - 29 mmol/L   19   Calcium  8.7 - 10.3 mg/dL 89.0  88.7  88.8   Total Protein 6.0 - 8.5 g/dL   7.3   Total Bilirubin 0.0 - 1.2 mg/dL   0.3   Alkaline Phos 44 - 121 IU/L   80   AST 0 - 40 IU/L   46   ALT 0 - 32 IU/L   42     Lipid Panel     Component Value Date/Time   CHOL 155 01/07/2022 1642   TRIG 150 (H) 01/07/2022 1642   HDL 70 01/07/2022 1642   CHOLHDL 2.4 07/15/2020 0854   CHOLHDL 3.5 03/20/2014 1118   VLDL 20 03/20/2014 1118   LDLCALC 60 01/07/2022 1642    CBC    Component Value Date/Time   WBC 5.2 01/07/2022 1642   WBC 4.5 03/20/2014 1118   RBC 3.97 01/07/2022 1642   RBC 4.52 03/20/2014 1118   HGB 11.5 01/07/2022 1642   HCT 34.1 01/07/2022 1642   HCT 38 08/12/2011  1445   PLT 171 01/07/2022 1642   MCV 86 01/07/2022 1642   MCV 82.2 08/12/2011 1445   MCH 29.0 01/07/2022 1642   MCH 28.8 03/20/2014 1118   MCHC 33.7 01/07/2022 1642   MCHC 34.3 03/20/2014 1118   RDW 12.7 01/07/2022 1642   LYMPHSABS 2.8 01/07/2022 1642   MONOABS 0.3 07/10/2011 1327   EOSABS 0.2 01/07/2022 1642   BASOSABS 0.0 01/07/2022 1642    Lab Results  Component Value Date   HGBA1C 5.40 04/19/2015       Assessment & Plan Right Knee Osteoarthritis Chronic osteoarthritis with inadequate pain control on meloxicam . Declines injections, prefers oral medication. Tramadol  considered for severe pain with monitoring for addiction. - Prescribed tramadol  30 tablets with one refill for severe pain, as needed. - Sent tramadol  prescription to [facility] for home delivery. - Continue meloxicam  for daily management. - Follow up with orthopedics  as needed.  Hypertension Hypertension management complicated by recent medication changes. Current BP elevated but improved upon repeat 2 149/72 mmHg. Uncertain current medication regimen. - Contact [facility] to confirm current blood pressure medication and update records. - Recheck blood pressure after sitting quietly for five minutes. - Educated on factors affecting blood pressure readings. -Counseled on blood pressure goal of less than 130/80, low-sodium, DASH diet, medication compliance, 150 minutes of moderate intensity exercise per week. Discussed medication compliance, adverse effects.   Hypercalcemia Calcium  levels improved but still slightly elevated. Continued monitoring required. - Order lab tests to recheck calcium  levels.   General Health Maintenance Routine monitoring required due to medication use and chronic conditions. - Order lab tests for cholesterol, kidney function, and liver function. - Advise to schedule lab tests on a fasting day.    Meds ordered this encounter  Medications   traMADol  (ULTRAM ) 50 MG tablet    Sig: Take 1 tablet (50 mg total) by mouth at bedtime as needed.    Dispense:  30 tablet    Refill:  1   traZODone  (DESYREL ) 50 MG tablet    Sig: Take 1 tablet (50 mg total) by mouth at bedtime.    Dispense:  100 tablet    Refill:  3   atorvastatin  (LIPITOR) 20 MG tablet    Sig: Take 1 tablet (20 mg total) by mouth daily.    Dispense:  100 tablet    Refill:  2    Please send a replace/new response with 100-Day Supply if appropriate to maximize member benefit. Requesting 1 year supply.    Follow-up: No follow-ups on file.       Corrina Sabin, MD, FAAFP. [facility] [place], [place] [phone]   09/07/2023, 5:15 PM

## 2023-09-07 NOTE — Telephone Encounter (Signed)
 Her blood pressure was elevated during her visit today and she informs me her medication regimen was changed by a doctor from her insurance company.  She was unable to recall the new antihypertensives she was placed on.  Can you please check to see who the prescribing clinician is and what medication is and also update her med list to reflect her current medication?  Thank you.

## 2023-09-09 ENCOUNTER — Ambulatory Visit: Attending: Family Medicine

## 2023-09-09 DIAGNOSIS — E7800 Pure hypercholesterolemia, unspecified: Secondary | ICD-10-CM

## 2023-09-09 DIAGNOSIS — I10 Essential (primary) hypertension: Secondary | ICD-10-CM

## 2023-09-10 ENCOUNTER — Ambulatory Visit: Payer: Self-pay | Admitting: Family Medicine

## 2023-09-10 LAB — CMP14+EGFR
ALT: 31 IU/L (ref 0–32)
AST: 41 IU/L — ABNORMAL HIGH (ref 0–40)
Albumin: 4.3 g/dL (ref 3.8–4.8)
Alkaline Phosphatase: 75 IU/L (ref 44–121)
BUN/Creatinine Ratio: 18 (ref 12–28)
BUN: 19 mg/dL (ref 8–27)
Bilirubin Total: 0.4 mg/dL (ref 0.0–1.2)
CO2: 21 mmol/L (ref 20–29)
Calcium: 11 mg/dL — ABNORMAL HIGH (ref 8.7–10.3)
Chloride: 108 mmol/L — ABNORMAL HIGH (ref 96–106)
Creatinine, Ser: 1.03 mg/dL — ABNORMAL HIGH (ref 0.57–1.00)
Globulin, Total: 2.2 g/dL (ref 1.5–4.5)
Glucose: 90 mg/dL (ref 70–99)
Potassium: 3.7 mmol/L (ref 3.5–5.2)
Sodium: 146 mmol/L — ABNORMAL HIGH (ref 134–144)
Total Protein: 6.5 g/dL (ref 6.0–8.5)
eGFR: 58 mL/min/1.73 — ABNORMAL LOW (ref >59)

## 2023-09-10 LAB — LP+NON-HDL CHOLESTEROL
Cholesterol, Total: 140 mg/dL (ref 100–199)
HDL: 59 mg/dL (ref >39)
LDL Chol Calc (NIH): 59 mg/dL (ref 0–99)
Total Non-HDL-Chol (LDL+VLDL): 81 mg/dL (ref 0–129)
Triglycerides: 123 mg/dL (ref 0–149)
VLDL Cholesterol Cal: 22 mg/dL (ref 5–40)

## 2023-09-10 NOTE — Addendum Note (Signed)
 Addended by: Loise Esguerra on: 09/10/2023 12:01 PM   Modules accepted: Orders

## 2023-09-10 NOTE — Telephone Encounter (Signed)
 I called the patient and verified that she is on amlodipine -olmesartan 10-40 mg prescribed by a clinician from her insurance company and no longer takes amlodipine  or lisinopril .  I have updated her med list to this effect.

## 2023-09-23 ENCOUNTER — Other Ambulatory Visit: Payer: Self-pay | Admitting: Family Medicine

## 2023-09-23 DIAGNOSIS — I10 Essential (primary) hypertension: Secondary | ICD-10-CM

## 2023-09-23 NOTE — Telephone Encounter (Unsigned)
 Copied from CRM [id number]. Topic: Clinical - Medication Refill >> Sep 23, 2023  1:24 PM [name] wrote: Medication: isosorbide  mononitrate (IMDUR ) 30 MG 24 hr tablet [[id number]]  Has the patient contacted their pharmacy? Yes (Agent: If no, request that the patient contact the pharmacy for the refill. If patient does not wish to contact the pharmacy document the reason why and proceed with request.) (Agent: If yes, when and what did the pharmacy advise?)  This is the patient's preferred pharmacy:   [facility] [place] [place] - [address] [place] [address] Phone: [phone] Fax: [phone]  Is this the correct pharmacy for this prescription? Yes If no, delete pharmacy and type the correct one.   Has the prescription been filled recently? No  Is the patient out of the medication? Yes  Has the patient been seen for an appointment in the last year OR does the patient have an upcoming appointment? Yes  Can we respond through MyChart? Yes  Agent: Please be advised that Rx refills may take up to 3 business days. We ask that you follow-up with your pharmacy.

## 2023-09-24 NOTE — Telephone Encounter (Signed)
 Refused Imdur  30 mg because it's being requested too soon.   07/05/2023 #100, 2 refills was sent in.

## 2023-11-09 ENCOUNTER — Telehealth: Payer: Self-pay | Admitting: Family Medicine

## 2023-11-09 NOTE — Telephone Encounter (Signed)
 Pt confirmed apt 9/10

## 2023-11-10 ENCOUNTER — Other Ambulatory Visit: Payer: Self-pay

## 2023-11-10 ENCOUNTER — Encounter: Payer: Self-pay | Admitting: Family Medicine

## 2023-11-10 ENCOUNTER — Ambulatory Visit: Attending: Family Medicine | Admitting: Family Medicine

## 2023-11-10 DIAGNOSIS — I10 Essential (primary) hypertension: Secondary | ICD-10-CM | POA: Diagnosis not present

## 2023-11-10 DIAGNOSIS — F32A Depression, unspecified: Secondary | ICD-10-CM

## 2023-11-10 DIAGNOSIS — F419 Anxiety disorder, unspecified: Secondary | ICD-10-CM

## 2023-11-10 MED ORDER — FLUZONE HIGH-DOSE 0.5 ML IM SUSY
0.5000 mL | PREFILLED_SYRINGE | Freq: Once | INTRAMUSCULAR | 0 refills | Status: AC
Start: 2023-11-10 — End: 2023-11-11
  Filled 2023-11-10: qty 0.5, 1d supply, fill #0

## 2023-11-10 MED ORDER — HYDROXYZINE HCL 25 MG PO TABS: ORAL_TABLET | Freq: Three times a day (TID) | ORAL | 2 refills | Status: AC | PRN

## 2023-11-10 MED ORDER — ESCITALOPRAM OXALATE 20 MG PO TABS
ORAL_TABLET | Freq: Every day | ORAL | 3 refills | Status: AC
Start: 2023-11-10 — End: 2024-11-09

## 2023-11-10 NOTE — Patient Instructions (Signed)
 Hypercalcemia Hypercalcemia is when the level of calcium  in a person's blood is above normal. The body needs calcium  to make bones and keep them strong. Calcium  also helps the muscles, nerves, brain, and heart work the way they should. Most of the calcium  in the body is stored in the bones. There is also calcium  in the blood. Hypercalcemia occurs when there is too much calcium  in your blood. Calcium  levels in the blood are regulated by hormones, kidneys, and the gastrointestinal tract.  Hypercalcemia can happen when calcium  comes out of the bones, or when the kidneys are not able to remove calcium  from the blood. Hypercalcemia can be mild or severe. What are the causes? There are many possible causes of hypercalcemia. Common causes of this condition include: Hyperparathyroidism. This is a condition in which the body produces too much parathyroid hormone. There are four parathyroid glands in your neck. These glands produce a chemical messenger (hormone) that helps the body absorb calcium  from foods and helps your bones release calcium . Certain kinds of cancer. Less common causes of hypercalcemia include: Calcium  and vitamin D  dietary supplements. Chronic kidney disease. Hyperthyroidism. Severe dehydration. Being on bed rest or being inactive for a long time. Certain medicines. Infections. What increases the risk? You are more likely to develop this condition if: You are female. You are 73 years of age or older. You have a family history of hypercalcemia. What are the signs or symptoms? Mild hypercalcemia that starts slowly may not cause symptoms. Severe, sudden hypercalcemia is more likely to cause symptoms, such as: Being more thirsty than usual. Needing to urinate more often than usual. Abdominal pain. Nausea and vomiting. Constipation. Muscle pain, twitching, or weakness. Feeling very tired. How is this diagnosed?  Hypercalcemia is usually diagnosed with a blood test. You may also  have tests to help check what is causing this condition. Tests include imaging tests and more blood tests. How is this treated? Treatment for hypercalcemia depends on the cause. Treatment may include: Receiving fluids through an IV. Medicines. These can be used to: Keep calcium  levels steady after receiving fluids (loop diuretics). Keep calcium  in your bones (bisphosphonates). Lower the calcium  level in your blood. Surgery to remove overactive parathyroid glands. A procedure that filters your blood to correct calcium  levels (hemodialysis). Follow these instructions at home:  Take over-the-counter and prescription medicines only as told by your health care provider. Follow instructions from your health care provider about eating or drinking restrictions. Drink enough fluid to keep your urine pale yellow. Stay active. Weight-bearing exercise helps to keep calcium  in your bones. Follow instructions from your health care provider about what type and level of exercise is safe for you. Keep all follow-up visits. This is important. Contact a health care provider if: You have a fever. Your heartbeat is irregular or very fast. You have changes in mood, memory, or personality. Get help right away if: You have severe abdominal pain. You have chest pain. You have trouble breathing. You become very confused and sleepy. You lose consciousness. These symptoms may represent a serious problem that is an emergency. Do not wait to see if the symptoms will go away. Get medical help right away. Call your local emergency services (911 in the [place] Do not drive yourself to the hospital. Summary Hypercalcemia is when the level of calcium  in a person's blood is above normal. The body needs calcium  to make bones and keep them strong. There are many possible causes of hypercalcemia, and treatment depends on  the cause. Take over-the-counter and prescription medicines only as told by your health care  provider. This information is not intended to replace advice given to you by your health care provider. Make sure you discuss any questions you have with your health care provider. Document Revised: 07/24/2020 Document Reviewed: 07/24/2020 Elsevier Patient Education  2024 [employer].

## 2023-11-10 NOTE — Progress Notes (Unsigned)
 Subjective:  Patient ID: [name], female    DOB: 01-25-1951  Age: 73 y.o. MRN: [id number]  CC: Medical Management of Chronic Issues     Discussed the use of AI scribe software for clinical note transcription with the patient, who gave verbal consent to proceed.  History of Present Illness [name] is a 73 year old female with  a history of  hypertension, anxiety and depression, history of breast cancer (status post left mastectomy over 16 years ago) osteoarthritis of the hand osteoarthritis.   She was seen 2 months ago for chronic disease management.  Today her blood pressure is severely elevated and she is yet to take any antihypertensive as she is fasting in anticipation of labs. She states a doctor with her insurance company placed her on Azor 10/40 mg for management of her blood pressure and she is taking this in addition to her Imdur  and propranolol .  She states her blood pressures at home have been normal.  She has had hypercalcemia for which she had resisted endocrine referral at her last visit as she wanted to wait till today to repeat her labs and then decide if she would see endocrine.  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   Hypercholesteremia    dx 2010   Hypertension    dx 1996   Leukopenia 07/10/2011   Stable compared to 2012    Past Surgical History:  Procedure Laterality Date   ABDOMINAL HYSTERECTOMY     late 1990s.   BREAST SURGERY Left    2004   MASTECTOMY  2005   left side    Family History  Problem Relation Age of Onset   Hypertension Mother    Arthritis Sister        RA    Social History   Socioeconomic History   Marital status: Divorced    Spouse name: Not on file   Number of children: Not on file    Years of education: Not on file   Highest education level: Not on file  Occupational History   Not on file  Tobacco Use   Smoking status: Former    Current packs/day: 0.00    Types: Cigarettes    Quit date: 03/20/1994    Years since quitting: 29.6   Smokeless tobacco: Never  Substance and Sexual Activity   Alcohol use: No   Drug use: No   Sexual activity: Yes    Birth control/protection: Condom  Other Topics Concern   Not on file  Social History Narrative   Recently separated from husband of 39 years.    Social Drivers of Corporate investment banker Strain: Low Risk  (10/07/2022)   Overall Financial Resource Strain (CARDIA)    Difficulty of Paying Living Expenses: Not hard at all  Food Insecurity: No Food Insecurity (10/07/2022)   Hunger Vital Sign    Worried About Running Out of Food in the Last Year: Never true    Ran Out of Food in the Last Year: Never true  Transportation Needs: No Transportation Needs (10/07/2022)   PRAPARE - Administrator, Civil Service (Medical): No    Lack of Transportation (Non-Medical): No  Physical Activity: Sufficiently Active (10/07/2022)   Exercise Vital  Sign    Days of Exercise per Week: 5 days    Minutes of Exercise per Session: 30 min  Stress: Stress Concern Present (02/13/2022)   [employer] of Occupational Health - Occupational Stress Questionnaire    Feeling of Stress : To some extent  Social Connections: Moderately Integrated (10/07/2022)   Social Connection and Isolation Panel    Frequency of Communication with Friends and Family: More than three times a week    Frequency of Social Gatherings with Friends and Family: Three times a week    Attends Religious Services: 1 to 4 times per year    Active Member of Clubs or Organizations: Yes    Attends Banker Meetings: 1 to 4 times per year    Marital Status: Divorced    Allergies  Allergen Reactions   Fish Allergy Shortness Of Breath   Strawberry Extract  Shortness Of Breath   Aspirin Nausea Only    Outpatient Medications Prior to Visit  Medication Sig Dispense Refill   acetaminophen  (TYLENOL ) 325 MG tablet Take 2 tablets (650 mg total) by mouth every 6 (six) hours as needed. 30 tablet 0   amLODipine -olmesartan (AZOR) 10-40 MG tablet Take 1 tablet by mouth daily.     atorvastatin  (LIPITOR) 20 MG tablet Take 1 tablet (20 mg total) by mouth daily. 100 tablet 2   cetirizine  (ZYRTEC ) 10 MG tablet Take 1 tablet (10 mg total) by mouth daily. 30 tablet 6   Elastic Bandages & Supports (WRIST SPLINT/ELASTIC LEFT MED) MISC 1 each by Does not apply route daily. 1 each 0   Elastic Bandages & Supports (WRIST SPLINT/ELASTIC RIGHT MED) MISC 1 each by Does not apply route daily. 1 each 0   isosorbide  mononitrate (IMDUR ) 30 MG 24 hr tablet TAKE 1 TABLET BY MOUTH DAILY 100 tablet 2   meloxicam  (MOBIC ) 7.5 MG tablet TAKE 1 TABLET BY MOUTH DAILY 100 tablet 2   propranolol  ER (INDERAL  LA) 80 MG 24 hr capsule TAKE 1 CAPSULE BY MOUTH AT  BEDTIME 100 capsule 2   traMADol  (ULTRAM ) 50 MG tablet Take 1 tablet (50 mg total) by mouth at bedtime as needed. 30 tablet 1   traZODone  (DESYREL ) 50 MG tablet Take 1 tablet (50 mg total) by mouth at bedtime. 100 tablet 3   hydrOXYzine  (ATARAX ) 25 MG tablet TAKE 1 TABLET (25 MG TOTAL) BY MOUTH 3 (THREE) TIMES DAILY AS NEEDED. 90 tablet 2   predniSONE  (DELTASONE ) 20 MG tablet Take 1 tablet (20 mg total) by mouth daily with breakfast. (Patient not taking: Reported on 11/10/2023) 5 tablet 0   escitalopram  (LEXAPRO ) 20 MG tablet TAKE 1 TABLET (20 MG TOTAL) BY MOUTH DAILY. (Patient not taking: Reported on 11/10/2023) 90 tablet 3   No facility-administered medications prior to visit.     ROS Review of Systems  Constitutional:  Negative for activity change and appetite change.  HENT:  Negative for sinus pressure and sore throat.   Respiratory:  Negative for chest tightness, shortness of breath and wheezing.   Cardiovascular:  Negative  for chest pain and palpitations.  Gastrointestinal:  Negative for abdominal distention, abdominal pain and constipation.  Genitourinary: Negative.   Musculoskeletal:        Chronic knee pain  Psychiatric/Behavioral:  Negative for behavioral problems and dysphoric mood.     Objective:  BP (!) 180/73   Pulse 60   Ht 5' 3 (1.6 m)   Wt 136 lb 12.8 oz (62.1 kg)   SpO2 100%  BMI 24.23 kg/m      11/10/2023   12:00 PM 11/10/2023   10:41 AM 09/07/2023    5:14 PM  BP/Weight  Systolic BP 180 193 149  Diastolic BP 73 69 72  Wt. (Lbs)  136.8   BMI  24.23 kg/m2       Physical Exam Constitutional:      Appearance: She is well-developed.  Cardiovascular:     Rate and Rhythm: Normal rate.     Heart sounds: Normal heart sounds. No murmur heard. Pulmonary:     Effort: Pulmonary effort is normal.     Breath sounds: Normal breath sounds. No wheezing or rales.  Chest:     Chest wall: No tenderness.  Abdominal:     General: Bowel sounds are normal. There is no distension.     Palpations: Abdomen is soft. There is no mass.     Tenderness: There is no abdominal tenderness.  Musculoskeletal:     Right lower leg: No edema.     Left lower leg: No edema.     Comments: Tenderness of right knee on range of motion  Neurological:     Mental Status: She is alert and oriented to person, place, and time.  Psychiatric:        Mood and Affect: Mood normal.        Latest Ref Rng & Units 11/10/2023   11:41 AM 09/09/2023    2:02 PM 04/26/2023    3:37 PM  CMP  Glucose 70 - 99 mg/dL  90    BUN 8 - 27 mg/dL  19    Creatinine 9.42 - 1.00 mg/dL  8.96    Sodium 865 - 855 mmol/L  146    Potassium 3.5 - 5.2 mmol/L  3.7    Chloride 96 - 106 mmol/L  108    CO2 20 - 29 mmol/L  21    Calcium  8.7 - 10.3 mg/dL 88.2  88.9  89.0   Total Protein 6.0 - 8.5 g/dL  6.5    Total Bilirubin 0.0 - 1.2 mg/dL  0.4    Alkaline Phos 44 - 121 IU/L  75    AST 0 - 40 IU/L  41    ALT 0 - 32 IU/L  31      Lipid Panel      Component Value Date/Time   CHOL 140 09/09/2023 1402   TRIG 123 09/09/2023 1402   HDL 59 09/09/2023 1402   CHOLHDL 2.4 07/15/2020 0854   CHOLHDL 3.5 03/20/2014 1118   VLDL 20 03/20/2014 1118   LDLCALC 59 09/09/2023 1402    CBC    Component Value Date/Time   WBC 5.2 01/07/2022 1642   WBC 4.5 03/20/2014 1118   RBC 3.97 01/07/2022 1642   RBC 4.52 03/20/2014 1118   HGB 11.5 01/07/2022 1642   HCT 34.1 01/07/2022 1642   HCT 38 08/12/2011 1445   PLT 171 01/07/2022 1642   MCV 86 01/07/2022 1642   MCV 82.2 08/12/2011 1445   MCH 29.0 01/07/2022 1642   MCH 28.8 03/20/2014 1118   MCHC 33.7 01/07/2022 1642   MCHC 34.3 03/20/2014 1118   RDW 12.7 01/07/2022 1642   LYMPHSABS 2.8 01/07/2022 1642   MONOABS 0.3 07/10/2011 1327   EOSABS 0.2 01/07/2022 1642   BASOSABS 0.0 01/07/2022 1642    Lab Results  Component Value Date   HGBA1C 5.40 04/19/2015       Assessment & Plan  1. Hypercalcemia (Primary) Persistently elevated calcium   Will check level and refer to endocrine if still elevated - PTH, Intact and Calcium   2. Anxiety and depression Stable - escitalopram  (LEXAPRO ) 20 MG tablet; TAKE 1 TABLET (20 MG TOTAL) BY MOUTH DAILY.  Dispense: 90 tablet; Refill: 3 - hydrOXYzine  (ATARAX ) 25 MG tablet; TAKE 1 TABLET (25 MG TOTAL) BY MOUTH 3 (THREE) TIMES DAILY AS NEEDED.  Dispense: 90 tablet; Refill: 2  3. Essential hypertension Uncontrolled due to the fact that she is yet to take her antihypertensive She has antihypertensives with her in the exam room and we have provided water for her to take it Advised to take antihypertensive and she will be reassessed at her next visit Counseled on blood pressure goal of less than 130/80, low-sodium, DASH diet, medication compliance, 150 minutes of moderate intensity exercise per week. Discussed medication compliance, adverse effects.         Meds ordered this encounter  Medications   escitalopram  (LEXAPRO ) 20 MG tablet    Sig: TAKE  1 TABLET (20 MG TOTAL) BY MOUTH DAILY.    Dispense:  90 tablet    Refill:  3   hydrOXYzine  (ATARAX ) 25 MG tablet    Sig: TAKE 1 TABLET (25 MG TOTAL) BY MOUTH 3 (THREE) TIMES DAILY AS NEEDED.    Dispense:  90 tablet    Refill:  2    Follow-up: Return in about 6 weeks (around 12/22/2023) for Blood Pressure follow-up with PCP.       Corrina Sabin, MD, FAAFP. [facility] [place], [place] [phone]   11/11/2023, 3:05 PM

## 2023-11-11 ENCOUNTER — Ambulatory Visit: Payer: Self-pay | Admitting: Family Medicine

## 2023-11-11 ENCOUNTER — Encounter: Payer: Self-pay | Admitting: Family Medicine

## 2023-11-12 LAB — PTH, INTACT AND CALCIUM
Calcium: 11.7 mg/dL — ABNORMAL HIGH (ref 8.7–10.3)
PTH: 43 pg/mL (ref 15–65)

## 2023-11-19 ENCOUNTER — Telehealth: Payer: Self-pay

## 2023-11-19 NOTE — Telephone Encounter (Signed)
 Copied from CRM [id number]. Topic: Referral - Question >> Nov 19, 2023  2:50 PM Donna BRAVO wrote: Reason for CRM: patient received call from the Endocrinologist office stating they do not take medicaid. Patient would like a call back regarding what happens moving forward and an office that will accept medicaid

## 2023-11-19 NOTE — Telephone Encounter (Signed)
 Can we send the referral to somewhere that accepts medicaid.

## 2023-11-22 NOTE — Telephone Encounter (Signed)
 Noted

## 2023-11-23 NOTE — Telephone Encounter (Signed)
 LVM informing patient that her referral has been changed.

## 2023-12-22 ENCOUNTER — Ambulatory Visit: Admitting: Family Medicine

## 2023-12-28 ENCOUNTER — Ambulatory Visit: Payer: 59 | Attending: Family Medicine

## 2023-12-28 VITALS — Ht 63.0 in | Wt 144.0 lb

## 2023-12-28 DIAGNOSIS — Z0000 Encounter for general adult medical examination without abnormal findings: Secondary | ICD-10-CM

## 2023-12-28 NOTE — Patient Instructions (Signed)
 [name],  Thank you for taking the time for your Medicare Wellness Visit. I appreciate your continued commitment to your health goals. Please review the care plan we discussed, and feel free to reach out if I can assist you further.  Medicare recommends these wellness visits once per year to help you and your care team stay ahead of potential health issues. These visits are designed to focus on prevention, allowing your provider to concentrate on managing your acute and chronic conditions during your regular appointments.  Please note that Annual Wellness Visits do not include a physical exam. Some assessments may be limited, especially if the visit was conducted virtually. If needed, we may recommend a separate in-person follow-up with your provider.  Ongoing Care Seeing your primary care provider every 3 to 6 months helps us  monitor your health and provide consistent, personalized care.   Referrals If a referral was made during today's visit and you haven't received any updates within two weeks, please contact the referred provider directly to check on the status.  Recommended Screenings:  Health Maintenance  Topic Date Due   COVID-19 Vaccine (3 - 2025-26 season) 11/01/2023   Breast Cancer Screening  04/25/2024*   DEXA scan (bone density measurement)  04/25/2024*   Cologuard (Stool DNA test)  04/25/2024*   Medicare Annual Wellness Visit  12/27/2024   DTaP/Tdap/Td vaccine (2 - Td or Tdap) 06/02/2027   Pneumococcal Vaccine for age over 65  Completed   Flu Shot  Completed   Hepatitis C Screening  Completed   Zoster (Shingles) Vaccine  Completed   Meningitis B Vaccine  Aged Out   Stool Blood Test  Discontinued  *Topic was postponed. The date shown is not the original due date.       12/28/2023    3:43 PM  Advanced Directives  Does Patient Have a Medical Advance Directive? Yes  Type of Estate Agent of [place];Living will  Copy of Healthcare Power of  Attorney in Chart? No - copy requested   Advance Care Planning is important because it: Ensures you receive medical care that aligns with your values, goals, and preferences. Provides guidance to your family and loved ones, reducing the emotional burden of decision-making during critical moments.  Vision: Annual vision screenings are recommended for early detection of glaucoma, cataracts, and diabetic retinopathy. These exams can also reveal signs of chronic conditions such as diabetes and high blood pressure.  Dental: Annual dental screenings help detect early signs of oral cancer, gum disease, and other conditions linked to overall health, including heart disease and diabetes.  Please see the attached documents for additional preventive care recommendations.

## 2023-12-28 NOTE — Progress Notes (Signed)
 Because this visit was a virtual/telehealth visit,  certain criteria was not obtained, such a blood pressure, CBG if applicable, and timed get up and go. Any medications not marked as taking were not mentioned during the medication reconciliation part of the visit. Any vitals not documented were not able to be obtained due to this being a telehealth visit or patient was unable to self-report a recent blood pressure reading due to a lack of equipment at home via telehealth. Vitals that have been documented are verbally provided by the patient.   Subjective:   [name] is a 73 y.o. who presents for a Medicare Wellness preventive visit.  As a reminder, Annual Wellness Visits don't include a physical exam, and some assessments may be limited, especially if this visit is performed virtually. We may recommend an in-person follow-up visit with your provider if needed.  Visit Complete: Virtual I connected with  Ronal KATHEE Gavel on 12/28/23 by a audio enabled telemedicine application and verified that I am speaking with the correct person using two identifiers.  Patient Location: Home  Provider Location: Office/Clinic  I discussed the limitations of evaluation and management by telemedicine. The patient expressed understanding and agreed to proceed.  Vital Signs: Because this visit was a virtual/telehealth visit, some criteria may be missing or patient reported. Any vitals not documented were not able to be obtained and vitals that have been documented are patient reported.  VideoDeclined- This patient declined Librarian, academic. Therefore the visit was completed with audio only.  Persons Participating in Visit: Patient.  AWV Questionnaire: No: Patient Medicare AWV questionnaire was not completed prior to this visit.  Cardiac Risk Factors include: advanced age (>55men, >62 women);hypertension;dyslipidemia;family history of premature cardiovascular disease      Objective:    Today's Vitals   12/28/23 1538  Weight: 144 lb (65.3 kg)  Height: 5' 3 (1.6 m)  PainSc: 0-No pain   Body mass index is 25.51 kg/m.     12/28/2023    3:43 PM 10/07/2022    6:27 PM 02/13/2022    2:54 PM 04/18/2019   11:07 AM 10/11/2017   12:32 PM 12/17/2016    1:44 PM 10/21/2015    2:22 PM  Advanced Directives  Does Patient Have a Medical Advance Directive? Yes No No No No  No  No   Type of Estate Agent of [place];Living will        Copy of Healthcare Power of Attorney in Chart? No - copy requested        Would patient like information on creating a medical advance directive?  Yes (MAU/Ambulatory/Procedural Areas - Information given) No - Patient declined  No - Patient declined   No - patient declined information      Data saved with a previous flowsheet row definition    Current Medications (verified) Outpatient Encounter Medications as of 12/28/2023  Medication Sig   acetaminophen  (TYLENOL ) 325 MG tablet Take 2 tablets (650 mg total) by mouth every 6 (six) hours as needed.   amLODipine -olmesartan (AZOR) 10-40 MG tablet Take 1 tablet by mouth daily.   atorvastatin  (LIPITOR) 20 MG tablet Take 1 tablet (20 mg total) by mouth daily.   cetirizine  (ZYRTEC ) 10 MG tablet Take 1 tablet (10 mg total) by mouth daily.   Elastic Bandages & Supports (WRIST SPLINT/ELASTIC LEFT MED) MISC 1 each by Does not apply route daily.   Elastic Bandages & Supports (WRIST SPLINT/ELASTIC RIGHT MED) MISC 1 each by  Does not apply route daily.   escitalopram  (LEXAPRO ) 20 MG tablet TAKE 1 TABLET (20 MG TOTAL) BY MOUTH DAILY.   hydrOXYzine  (ATARAX ) 25 MG tablet TAKE 1 TABLET (25 MG TOTAL) BY MOUTH 3 (THREE) TIMES DAILY AS NEEDED.   isosorbide  mononitrate (IMDUR ) 30 MG 24 hr tablet TAKE 1 TABLET BY MOUTH DAILY   meloxicam  (MOBIC ) 7.5 MG tablet TAKE 1 TABLET BY MOUTH DAILY   predniSONE  (DELTASONE ) 20 MG tablet Take 1 tablet (20 mg total) by mouth daily with breakfast.  (Patient not taking: Reported on 11/10/2023)   propranolol  ER (INDERAL  LA) 80 MG 24 hr capsule TAKE 1 CAPSULE BY MOUTH AT  BEDTIME   traMADol  (ULTRAM ) 50 MG tablet Take 1 tablet (50 mg total) by mouth at bedtime as needed.   traZODone  (DESYREL ) 50 MG tablet Take 1 tablet (50 mg total) by mouth at bedtime.   No facility-administered encounter medications on file as of 12/28/2023.    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   Hypercholesteremia    dx 2010   Hypertension    dx 1996   Leukopenia 07/10/2011   Stable compared to 2012   Past Surgical History:  Procedure Laterality Date   ABDOMINAL HYSTERECTOMY     late 1990s.   BREAST SURGERY Left    2004   MASTECTOMY  2005   left side   Family History  Problem Relation Age of Onset   Hypertension Mother    Arthritis Sister        RA   Social History   Socioeconomic History   Marital status: Divorced    Spouse name: Not on file   Number of children: Not on file   Years of education: Not on file   Highest education level: Not on file  Occupational History   Not on file  Tobacco Use   Smoking status: Former    Current packs/day: 0.00    Types: Cigarettes    Quit date: 03/20/1994    Years since quitting: 29.7   Smokeless tobacco: Never  Substance and Sexual Activity   Alcohol use: No   Drug use: No   Sexual activity: Yes    Birth control/protection: Condom  Other Topics Concern   Not on file  Social History Narrative   Recently separated from husband of 39 years.    Social Drivers of Corporate Investment Banker Strain: Low Risk  (12/28/2023)   Overall Financial Resource Strain (CARDIA)    Difficulty of  Paying Living Expenses: Not hard at all  Food Insecurity: No Food Insecurity (12/28/2023)   Hunger Vital Sign    Worried About Running Out of Food in the Last Year: Never true    Ran Out of Food in the Last Year: Never true  Transportation Needs: No Transportation Needs (12/28/2023)   PRAPARE - Administrator, Civil Service (Medical): No    Lack of Transportation (Non-Medical): No  Physical Activity: Sufficiently Active (12/28/2023)   Exercise Vital Sign    Days of Exercise per Week: 5 days    Minutes of Exercise per Session: 30 min  Stress: No Stress Concern Present (12/28/2023)   [employer] of Occupational  Health - Occupational Stress Questionnaire    Feeling of Stress: Not at all  Social Connections: Moderately Integrated (12/28/2023)   Social Connection and Isolation Panel    Frequency of Communication with Friends and Family: More than three times a week    Frequency of Social Gatherings with Friends and Family: Three times a week    Attends Religious Services: 1 to 4 times per year    Active Member of Clubs or Organizations: Yes    Attends Banker Meetings: 1 to 4 times per year    Marital Status: Divorced    Tobacco Counseling Counseling given: Not Answered    Clinical Intake:  Pre-visit preparation completed: Yes  Pain : No/denies pain Pain Score: 0-No pain     BMI - recorded: 25.51 Nutritional Status: BMI 25 -29 Overweight Nutritional Risks: None Diabetes: No  Lab Results  Component Value Date   HGBA1C 5.40 04/19/2015     How often do you need to have someone help you when you read instructions, pamphlets, or other written materials from your doctor or pharmacy?: 1 - Never  Interpreter Needed?: No  Information entered by :: Joseline Mccampbell N. Neema Fluegge, LPN.   Activities of Daily Living     12/28/2023    3:43 PM  In your present state of health, do you have any difficulty performing the following activities:  Hearing? 0   Vision? 0  Difficulty concentrating or making decisions? 0  Comment BSE: AVID READER, SEEK & FIND WORD SEARCH  Walking or climbing stairs? 0  Dressing or bathing? 0  Doing errands, shopping? 0  Preparing Food and eating ? N  Using the Toilet? N  In the past six months, have you accidently leaked urine? N  Do you have problems with loss of bowel control? N  Managing your Medications? N  Managing your Finances? N  Housekeeping or managing your Housekeeping? N    Patient Care Team: Delbert Clam, MD as PCP - General (Family Medicine) Fields, Sandi L, MD (Inactive) (Gastroenterology)  I have updated your Care Teams any recent Medical Services you may have received from other providers in the past year.     Assessment:   This is a routine wellness examination for [facility].  Hearing/Vision screen Hearing Screening - Comments:: Denies hearing difficulties.  Vision Screening - Comments:: Wears rx glasses - up to date with routine eye exams. Patient could not remember the eye doctor name.    Goals Addressed             This Visit's Progress    12/28/2023: To stay healthy.         Depression Screen     12/28/2023    3:45 PM 11/10/2023   10:42 AM 09/07/2023    4:30 PM 10/07/2022    6:17 PM 09/15/2022    3:58 PM 11/10/2021    8:57 AM 07/08/2020    3:30 PM  PHQ 2/9 Scores  PHQ - 2 Score 0   0   3  PHQ- 9 Score 0      11  Exception Documentation  Patient refusal Patient refusal  Patient refusal Patient refusal     Fall Risk     12/28/2023    3:41 PM 09/07/2023    4:29 PM 04/26/2023    2:50 PM 03/23/2023    4:23 PM 10/07/2022    6:25 PM  Fall Risk   Falls in the past year? 0 0 0 0 0  Number falls in  past yr: 0 0 0 0 0  Injury with Fall? 0 0 0 0 0  Risk for fall due to : No Fall Risks No Fall Risks No Fall Risks No Fall Risks No Fall Risks  Follow up Falls evaluation completed Falls evaluation completed Falls evaluation completed Falls evaluation completed Falls prevention  discussed;Education provided;Falls evaluation completed    MEDICARE RISK AT HOME:  Medicare Risk at Home Any stairs in or around the home?: Yes (LIVES ON SECOND FLOOR, ELEVATORS, WALKS THE STAIRS MOST OF THE TIME) If so, are there any without handrails?: No Home free of loose throw rugs in walkways, pet beds, electrical cords, etc?: Yes Adequate lighting in your home to reduce risk of falls?: Yes Life alert?: Yes (BEDROOM & BATHROOM) Use of a cane, walker or w/c?: No Grab bars in the bathroom?: Yes Shower chair or bench in shower?: Yes Elevated toilet seat or a handicapped toilet?: Yes  TIMED UP AND GO:  Was the test performed?  No  Cognitive Function: Declined/Normal: No cognitive concerns noted by patient or family. Patient alert, oriented, able to answer questions appropriately and recall recent events. No signs of memory loss or confusion.    12/28/2023    3:45 PM 02/13/2022    2:56 PM  MMSE - Mini Mental State Exam  Not completed: Unable to complete   Orientation to time  5  Orientation to Place  5  Registration  3  Attention/ Calculation  5  Recall  3  Language- name 2 objects  2  Language- repeat  1  Language- follow 3 step command  3  Language- read & follow direction  1  Write a sentence  1  Copy design  1  Total score  30        12/28/2023    3:48 PM 10/07/2022    6:27 PM 02/13/2022    2:58 PM  6CIT Screen  What Year? 0 points 0 points 0 points  What month? 0 points 0 points 0 points  What time? 0 points 0 points   Count back from 20 0 points 0 points 0 points  Months in reverse 0 points 0 points 0 points  Repeat phrase 0 points 0 points 0 points  Total Score 0 points 0 points     Immunizations Immunization History  Administered Date(s) Administered   Fluad Trivalent(High Dose 65+) 03/23/2023   INFLUENZA, HIGH DOSE SEASONAL PF 11/10/2023   Influenza,inj,Quad PF,6+ Mos 11/27/2014, 10/21/2015, 12/21/2017, 11/09/2018, 01/09/2020, 11/10/2021    Influenza-Unspecified 10/31/2013   PFIZER(Purple Top)SARS-COV-2 Vaccination 05/04/2019, 05/30/2019   Pneumococcal Conjugate-13 06/01/2017   Pneumococcal Polysaccharide-23 04/18/2019   Tdap 06/01/2017   Zoster Recombinant(Shingrix ) 04/18/2019, 07/17/2019    Screening Tests Health Maintenance  Topic Date Due   COVID-19 Vaccine (3 - 2025-26 season) 11/01/2023   Mammogram  04/25/2024 (Originally 07/02/2013)   DEXA SCAN  04/25/2024 (Originally 03/22/2016)   Fecal DNA (Cologuard)  04/25/2024 (Originally 03/22/1996)   Medicare Annual Wellness (AWV)  12/27/2024   DTaP/Tdap/Td (2 - Td or Tdap) 06/02/2027   Pneumococcal Vaccine: 50+ Years  Completed   Influenza Vaccine  Completed   Hepatitis C Screening  Completed   Zoster Vaccines- Shingrix   Completed   Meningococcal B Vaccine  Aged Out   COLON CANCER SCREENING ANNUAL FOBT  Discontinued    Health Maintenance Items Addressed: See Nurse Notes at the end of this note  Additional Screening:  Vision Screening: Recommended annual ophthalmology exams for early detection of glaucoma and other  disorders of the eye. Is the patient up to date with their annual eye exam?  Yes  Who is the provider or what is the name of the office in which the patient attends annual eye exams? Patient forgot name of eye doctor  Dental Screening: Recommended annual dental exams for proper oral hygiene  Community Resource Referral / Chronic Care Management: CRR required this visit?  No   CCM required this visit?  No   Plan:    I have personally reviewed and noted the following in the patient's chart:   Medical and social history Use of alcohol, tobacco or illicit drugs  Current medications and supplements including opioid prescriptions. Patient is not currently taking opioid prescriptions. Functional ability and status Nutritional status Physical activity Advanced directives List of other physicians Hospitalizations, surgeries, and ER visits in previous 12  months Vitals Screenings to include cognitive, depression, and falls Referrals and appointments  In addition, I have reviewed and discussed with patient certain preventive protocols, quality metrics, and best practice recommendations. A written personalized care plan for preventive services as well as general preventive health recommendations were provided to patient.   Roz LOISE Fuller, LPN   [id number]   After Visit Summary: (MyChart) Due to this being a telephonic visit, the after visit summary with patients personalized plan was offered to patient via MyChart   Notes: Nothing significant to report at this time.

## 2024-01-03 ENCOUNTER — Encounter: Payer: Self-pay | Admitting: Radiology

## 2024-01-19 ENCOUNTER — Telehealth: Payer: Self-pay | Admitting: Family Medicine

## 2024-01-19 NOTE — Telephone Encounter (Signed)
 pt confirmed appt (per vr) 11/20

## 2024-01-20 ENCOUNTER — Ambulatory Visit: Attending: Family Medicine | Admitting: Family Medicine

## 2024-01-20 ENCOUNTER — Encounter: Payer: Self-pay | Admitting: Family Medicine

## 2024-01-20 VITALS — BP 137/70 | HR 58 | Temp 98.4°F | Ht 63.0 in | Wt 138.4 lb

## 2024-01-20 DIAGNOSIS — I10 Essential (primary) hypertension: Secondary | ICD-10-CM | POA: Diagnosis not present

## 2024-01-20 NOTE — Patient Instructions (Addendum)
 Please call Endocrinology for an appointment to discuss your high calcium : [facility] Ph# [phone] Fax # [phone]. [address] [place], [place] [address]

## 2024-01-20 NOTE — Progress Notes (Signed)
 Subjective:  Patient ID: [name], female    DOB: [birth year]  Age: 73 y.o. MRN: [id number]  CC: Medical Management of Chronic Issues     Discussed the use of AI scribe software for clinical note transcription with the patient, who gave verbal consent to proceed.  History of Present Illness [name] is a 73 year old female with a history of  hypertension, anxiety and depression, history of breast cancer (status post left mastectomy over 16 years ago) osteoarthritis  who presents for blood pressure management.  Her blood pressure has improved from 180/73 mmHg  at her last visit to 137/70 mmHg, attributed to her medication regimen and lifestyle changes. She regularly monitors her blood pressure at home. Her current medications include Azor, spironolactone 25 mg, atorvastatin , Lexapro , trazodone , amlodipine , and propranolol  ER 50 mg. Spironolactone was recently added and refilled by her insurance company's doctor. She feels better overall and no longer uses a cane for ambulation, attributing this to Nubeqa. Which she takes OTC.  She has been referred to an endocrinologist for high calcium  levels but has not yet contacted her.    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   Hypercholesteremia    dx 2010   Hypertension    dx 1996   Leukopenia 07/10/2011   Stable compared to 2012    Past Surgical History:  Procedure Laterality Date   ABDOMINAL HYSTERECTOMY     late 1990s.   BREAST SURGERY Left    2004   MASTECTOMY  2005   left side    Family History  Problem Relation Age of Onset   Hypertension Mother    Arthritis Sister        RA    Social History   Socioeconomic History   Marital status: Divorced    Spouse name: Not on file   Number  of children: Not on file   Years of education: Not on file   Highest education level: Not on file  Occupational History   Not on file  Tobacco Use   Smoking status: Former    Current packs/day: 0.00    Types: Cigarettes    Quit date: 03/20/1994    Years since quitting: 29.8   Smokeless tobacco: Never  Substance and Sexual Activity   Alcohol use: No   Drug use: No   Sexual activity: Yes    Birth control/protection: Condom  Other Topics Concern   Not on file  Social History Narrative   Recently separated from husband of 39 years.    Social Drivers of Corporate Investment Banker Strain: Low Risk  (12/28/2023)   Overall Financial Resource Strain (CARDIA)    Difficulty of Paying Living Expenses: Not hard at all  Food Insecurity: No Food Insecurity (12/28/2023)   Hunger Vital Sign    Worried About Running Out of Food in the Last Year: Never true    Ran Out of Food in the Last Year: Never true  Transportation Needs: No Transportation Needs (12/28/2023)   PRAPARE - Administrator, Civil Service (Medical): No    Lack of Transportation (Non-Medical): No  Physical Activity: Sufficiently Active (12/28/2023)   Exercise Vital Sign    Days of Exercise per Week: 5 days  Minutes of Exercise per Session: 30 min  Stress: No Stress Concern Present (12/28/2023)   [employer] of Occupational Health - Occupational Stress Questionnaire    Feeling of Stress: Not at all  Social Connections: Moderately Integrated (12/28/2023)   Social Connection and Isolation Panel    Frequency of Communication with Friends and Family: More than three times a week    Frequency of Social Gatherings with Friends and Family: Three times a week    Attends Religious Services: 1 to 4 times per year    Active Member of Clubs or Organizations: Yes    Attends Banker Meetings: 1 to 4 times per year    Marital Status: Divorced    Allergies  Allergen Reactions   Fish Allergy Shortness  Of Breath   Strawberry Extract Shortness Of Breath   Aspirin Nausea Only    Outpatient Medications Prior to Visit  Medication Sig Dispense Refill   acetaminophen  (TYLENOL ) 325 MG tablet Take 2 tablets (650 mg total) by mouth every 6 (six) hours as needed. 30 tablet 0   amLODipine -olmesartan (AZOR) 10-40 MG tablet Take 1 tablet by mouth daily.     atorvastatin  (LIPITOR) 20 MG tablet Take 1 tablet (20 mg total) by mouth daily. 100 tablet 2   cetirizine  (ZYRTEC ) 10 MG tablet Take 1 tablet (10 mg total) by mouth daily. 30 tablet 6   Elastic Bandages & Supports (WRIST SPLINT/ELASTIC LEFT MED) MISC 1 each by Does not apply route daily. 1 each 0   Elastic Bandages & Supports (WRIST SPLINT/ELASTIC RIGHT MED) MISC 1 each by Does not apply route daily. 1 each 0   escitalopram  (LEXAPRO ) 20 MG tablet TAKE 1 TABLET (20 MG TOTAL) BY MOUTH DAILY. 90 tablet 3   hydrOXYzine  (ATARAX ) 25 MG tablet TAKE 1 TABLET (25 MG TOTAL) BY MOUTH 3 (THREE) TIMES DAILY AS NEEDED. 90 tablet 2   isosorbide  mononitrate (IMDUR ) 30 MG 24 hr tablet TAKE 1 TABLET BY MOUTH DAILY 100 tablet 2   meloxicam  (MOBIC ) 7.5 MG tablet TAKE 1 TABLET BY MOUTH DAILY 100 tablet 2   propranolol  ER (INDERAL  LA) 80 MG 24 hr capsule TAKE 1 CAPSULE BY MOUTH AT  BEDTIME 100 capsule 2   spironolactone (ALDACTONE) 25 MG tablet Take 25 mg by mouth daily.     traMADol  (ULTRAM ) 50 MG tablet Take 1 tablet (50 mg total) by mouth at bedtime as needed. 30 tablet 1   traZODone  (DESYREL ) 50 MG tablet Take 1 tablet (50 mg total) by mouth at bedtime. 100 tablet 3   predniSONE  (DELTASONE ) 20 MG tablet Take 1 tablet (20 mg total) by mouth daily with breakfast. (Patient not taking: Reported on 01/20/2024) 5 tablet 0   No facility-administered medications prior to visit.     ROS Review of Systems  Constitutional:  Negative for activity change and appetite change.  HENT:  Negative for sinus pressure and sore throat.   Respiratory:  Negative for chest tightness,  shortness of breath and wheezing.   Cardiovascular:  Negative for chest pain and palpitations.  Gastrointestinal:  Negative for abdominal distention, abdominal pain and constipation.  Genitourinary: Negative.   Musculoskeletal: Negative.   Psychiatric/Behavioral:  Negative for behavioral problems and dysphoric mood.     Objective:  BP 137/70   Pulse (!) 58   Temp 98.4 F (36.9 C) (Oral)   Ht 5' 3 (1.6 m)   Wt 138 lb 6.4 oz (62.8 kg)   SpO2 100%   BMI 24.52 kg/m  01/20/2024    9:22 AM 12/28/2023    3:38 PM 11/10/2023   12:00 PM  BP/Weight  Systolic BP 137  819  Diastolic BP 70  73  Wt. (Lbs) 138.4 144   BMI 24.52 kg/m2 25.51 kg/m2       Physical Exam Constitutional:      Appearance: She is well-developed.  Cardiovascular:     Rate and Rhythm: Normal rate.     Heart sounds: Normal heart sounds. No murmur heard. Pulmonary:     Effort: Pulmonary effort is normal.     Breath sounds: Normal breath sounds. No wheezing or rales.  Chest:     Chest wall: No tenderness.  Abdominal:     General: Bowel sounds are normal. There is no distension.     Palpations: Abdomen is soft. There is no mass.     Tenderness: There is no abdominal tenderness.  Musculoskeletal:        General: Normal range of motion.     Right lower leg: No edema.     Left lower leg: No edema.  Neurological:     Mental Status: She is alert and oriented to person, place, and time.  Psychiatric:        Mood and Affect: Mood normal.        Latest Ref Rng & Units 11/10/2023   11:41 AM 09/09/2023    2:02 PM 04/26/2023    3:37 PM  CMP  Glucose 70 - 99 mg/dL  90    BUN 8 - 27 mg/dL  19    Creatinine 9.42 - 1.00 mg/dL  8.96    Sodium 865 - 855 mmol/L  146    Potassium 3.5 - 5.2 mmol/L  3.7    Chloride 96 - 106 mmol/L  108    CO2 20 - 29 mmol/L  21    Calcium  8.7 - 10.3 mg/dL 88.2  88.9  89.0   Total Protein 6.0 - 8.5 g/dL  6.5    Total Bilirubin 0.0 - 1.2 mg/dL  0.4    Alkaline Phos 44 - 121  IU/L  75    AST 0 - 40 IU/L  41    ALT 0 - 32 IU/L  31      Lipid Panel     Component Value Date/Time   CHOL 140 09/09/2023 1402   TRIG 123 09/09/2023 1402   HDL 59 09/09/2023 1402   CHOLHDL 2.4 07/15/2020 0854   CHOLHDL 3.5 03/20/2014 1118   VLDL 20 03/20/2014 1118   LDLCALC 59 09/09/2023 1402    CBC    Component Value Date/Time   WBC 5.2 01/07/2022 1642   WBC 4.5 03/20/2014 1118   RBC 3.97 01/07/2022 1642   RBC 4.52 03/20/2014 1118   HGB 11.5 01/07/2022 1642   HCT 34.1 01/07/2022 1642   HCT 38 08/12/2011 1445   PLT 171 01/07/2022 1642   MCV 86 01/07/2022 1642   MCV 82.2 08/12/2011 1445   MCH 29.0 01/07/2022 1642   MCH 28.8 03/20/2014 1118   MCHC 33.7 01/07/2022 1642   MCHC 34.3 03/20/2014 1118   RDW 12.7 01/07/2022 1642   LYMPHSABS 2.8 01/07/2022 1642   MONOABS 0.3 07/10/2011 1327   EOSABS 0.2 01/07/2022 1642   BASOSABS 0.0 01/07/2022 1642    Lab Results  Component Value Date   HGBA1C 5.40 04/19/2015       Assessment & Plan Essential hypertension Blood pressure improved from 180/73 to 137/70. Current medications include Amlodipine -olmesartan, Propranolol   ER, and Spironolactone, which may affect potassium levels. - Checked potassium levels for potential hyperkalemia. - Continue current antihypertensive regimen. -Counseled on blood pressure goal of less than 130/80, low-sodium, DASH diet, medication compliance, 150 minutes of moderate intensity exercise per week. Discussed medication compliance, adverse effects.   Hypercalcemia Calcium  levels remain elevated. Referral to endocrinology made for further investigation.  - Provided endocrinology contact information and encouraged appointment scheduling.       No orders of the defined types were placed in this encounter.   Follow-up: Return in about 6 months (around 07/19/2024) for Chronic medical conditions.       Corrina Sabin, MD, FAAFP. [facility]  [place], [place] [phone]   01/20/2024, 10:10 AM

## 2024-01-21 ENCOUNTER — Ambulatory Visit: Payer: Self-pay | Admitting: Family Medicine

## 2024-01-21 LAB — BASIC METABOLIC PANEL WITH GFR
BUN/Creatinine Ratio: 17 (ref 12–28)
BUN: 22 mg/dL (ref 8–27)
CO2: 23 mmol/L (ref 20–29)
Calcium: 11.4 mg/dL — ABNORMAL HIGH (ref 8.7–10.3)
Chloride: 102 mmol/L (ref 96–106)
Creatinine, Ser: 1.3 mg/dL — ABNORMAL HIGH (ref 0.57–1.00)
Glucose: 86 mg/dL (ref 70–99)
Potassium: 4.6 mmol/L (ref 3.5–5.2)
Sodium: 137 mmol/L (ref 134–144)
eGFR: 44 mL/min/1.73 — ABNORMAL LOW

## 2024-02-06 ENCOUNTER — Other Ambulatory Visit: Payer: Self-pay | Admitting: Family Medicine

## 2024-02-06 DIAGNOSIS — M19041 Primary osteoarthritis, right hand: Secondary | ICD-10-CM

## 2024-03-25 ENCOUNTER — Other Ambulatory Visit: Payer: Self-pay | Admitting: Family Medicine

## 2024-03-25 DIAGNOSIS — I10 Essential (primary) hypertension: Secondary | ICD-10-CM

## 2024-06-01 ENCOUNTER — Ambulatory Visit: Admitting: "Endocrinology

## 2024-07-19 ENCOUNTER — Ambulatory Visit: Admitting: Family Medicine
